# Patient Record
Sex: Female | Born: 1955 | Race: White | Hispanic: No | Marital: Married | Smoking: Former smoker
Health system: Southern US, Community
[De-identification: ages and names within clinical notes are randomized; demographics above are authoritative.]

## PROBLEM LIST (undated history)

## (undated) DIAGNOSIS — F419 Anxiety disorder, unspecified: Secondary | ICD-10-CM

## (undated) DIAGNOSIS — R918 Other nonspecific abnormal finding of lung field: Secondary | ICD-10-CM

## (undated) DIAGNOSIS — K589 Irritable bowel syndrome without diarrhea: Secondary | ICD-10-CM

## (undated) HISTORY — PX: TONSILLECTOMY: SUR1361

## (undated) HISTORY — PX: TUBAL LIGATION: SHX77

---

## 2002-05-08 ENCOUNTER — Emergency Department (HOSPITAL_COMMUNITY): Admission: EM | Admit: 2002-05-08 | Discharge: 2002-05-08 | Payer: Self-pay | Admitting: *Deleted

## 2002-05-08 ENCOUNTER — Encounter: Payer: Self-pay | Admitting: *Deleted

## 2004-11-28 ENCOUNTER — Ambulatory Visit: Payer: Self-pay | Admitting: Family Medicine

## 2005-02-13 ENCOUNTER — Ambulatory Visit: Payer: Self-pay | Admitting: Family Medicine

## 2005-04-27 ENCOUNTER — Ambulatory Visit: Payer: Self-pay | Admitting: Family Medicine

## 2005-08-30 ENCOUNTER — Ambulatory Visit: Payer: Self-pay | Admitting: Family Medicine

## 2005-12-11 ENCOUNTER — Ambulatory Visit: Payer: Self-pay | Admitting: Family Medicine

## 2006-09-11 ENCOUNTER — Ambulatory Visit: Payer: Self-pay | Admitting: Family Medicine

## 2007-03-12 ENCOUNTER — Ambulatory Visit: Payer: Self-pay | Admitting: Family Medicine

## 2014-06-16 DIAGNOSIS — F320 Major depressive disorder, single episode, mild: Secondary | ICD-10-CM | POA: Insufficient documentation

## 2015-03-16 DIAGNOSIS — Z01419 Encounter for gynecological examination (general) (routine) without abnormal findings: Secondary | ICD-10-CM | POA: Insufficient documentation

## 2015-03-16 DIAGNOSIS — F3342 Major depressive disorder, recurrent, in full remission: Secondary | ICD-10-CM | POA: Insufficient documentation

## 2015-03-16 DIAGNOSIS — Z23 Encounter for immunization: Secondary | ICD-10-CM | POA: Insufficient documentation

## 2015-07-12 DIAGNOSIS — M25462 Effusion, left knee: Secondary | ICD-10-CM | POA: Insufficient documentation

## 2015-07-12 DIAGNOSIS — M7122 Synovial cyst of popliteal space [Baker], left knee: Secondary | ICD-10-CM | POA: Insufficient documentation

## 2016-08-07 DIAGNOSIS — M549 Dorsalgia, unspecified: Secondary | ICD-10-CM | POA: Insufficient documentation

## 2018-07-30 DIAGNOSIS — F17210 Nicotine dependence, cigarettes, uncomplicated: Secondary | ICD-10-CM | POA: Insufficient documentation

## 2018-07-30 DIAGNOSIS — N951 Menopausal and female climacteric states: Secondary | ICD-10-CM | POA: Insufficient documentation

## 2019-01-03 ENCOUNTER — Other Ambulatory Visit: Payer: Self-pay

## 2019-01-03 ENCOUNTER — Emergency Department (HOSPITAL_COMMUNITY): Payer: BLUE CROSS/BLUE SHIELD

## 2019-01-03 ENCOUNTER — Encounter (HOSPITAL_COMMUNITY): Payer: Self-pay | Admitting: Emergency Medicine

## 2019-01-03 ENCOUNTER — Emergency Department (HOSPITAL_COMMUNITY)
Admission: EM | Admit: 2019-01-03 | Discharge: 2019-01-03 | Disposition: A | Discharge status: Home / Self Care | Payer: BLUE CROSS/BLUE SHIELD | Attending: Emergency Medicine | Admitting: Emergency Medicine

## 2019-01-03 DIAGNOSIS — K529 Noninfective gastroenteritis and colitis, unspecified: Secondary | ICD-10-CM

## 2019-01-03 DIAGNOSIS — E860 Dehydration: Secondary | ICD-10-CM

## 2019-01-03 DIAGNOSIS — E876 Hypokalemia: Secondary | ICD-10-CM | POA: Diagnosis not present

## 2019-01-03 DIAGNOSIS — R918 Other nonspecific abnormal finding of lung field: Secondary | ICD-10-CM | POA: Diagnosis not present

## 2019-01-03 DIAGNOSIS — F17210 Nicotine dependence, cigarettes, uncomplicated: Secondary | ICD-10-CM | POA: Diagnosis not present

## 2019-01-03 DIAGNOSIS — R197 Diarrhea, unspecified: Secondary | ICD-10-CM | POA: Diagnosis present

## 2019-01-03 LAB — COMPREHENSIVE METABOLIC PANEL
ALT: 22 U/L (ref 0–44)
AST: 26 U/L (ref 15–41)
Albumin: 4 g/dL (ref 3.5–5.0)
Alkaline Phosphatase: 99 U/L (ref 38–126)
Anion gap: 19 — ABNORMAL HIGH (ref 5–15)
BUN: 15 mg/dL (ref 8–23)
CO2: 25 mmol/L (ref 22–32)
Calcium: 9.5 mg/dL (ref 8.9–10.3)
Chloride: 86 mmol/L — ABNORMAL LOW (ref 98–111)
Creatinine, Ser: 0.89 mg/dL (ref 0.44–1.00)
GFR calc Af Amer: >60 mL/min (ref >60)
GFR calc non Af Amer: >60 mL/min (ref >60)
Glucose, Bld: 117 mg/dL — ABNORMAL HIGH (ref 70–99)
Potassium: 2.9 mmol/L — ABNORMAL LOW (ref 3.5–5.1)
SODIUM: 130 mmol/L — AB (ref 135–145)
Total Bilirubin: 0.7 mg/dL (ref 0.3–1.2)
Total Protein: 8.4 g/dL — ABNORMAL HIGH (ref 6.5–8.1)

## 2019-01-03 LAB — URINALYSIS, ROUTINE W REFLEX MICROSCOPIC
Bilirubin Urine: NEGATIVE
GLUCOSE, UA: NEGATIVE mg/dL
Ketones, ur: 5 mg/dL — AB
Leukocytes,Ua: NEGATIVE
Nitrite: NEGATIVE
Protein, ur: NEGATIVE mg/dL
Specific Gravity, Urine: 1.006 (ref 1.005–1.030)
pH: 7 (ref 5.0–8.0)

## 2019-01-03 LAB — CBC
HCT: 48.1 % — ABNORMAL HIGH (ref 36.0–46.0)
Hemoglobin: 16 g/dL — ABNORMAL HIGH (ref 12.0–15.0)
MCH: 30.2 pg (ref 26.0–34.0)
MCHC: 33.3 g/dL (ref 30.0–36.0)
MCV: 90.9 fL (ref 80.0–100.0)
NRBC: 0 % (ref 0.0–0.2)
PLATELETS: 544 10*3/uL — AB (ref 150–400)
RBC: 5.29 MIL/uL — ABNORMAL HIGH (ref 3.87–5.11)
RDW: 12.5 % (ref 11.5–15.5)
WBC: 13.7 10*3/uL — ABNORMAL HIGH (ref 4.0–10.5)

## 2019-01-03 LAB — LIPASE, BLOOD: Lipase: 45 U/L (ref 11–51)

## 2019-01-03 LAB — MAGNESIUM: Magnesium: 2.1 mg/dL (ref 1.7–2.4)

## 2019-01-03 MED ORDER — IOHEXOL 300 MG/ML  SOLN
100.0000 mL | Freq: Once | INTRAMUSCULAR | Status: AC | PRN
  Administered 2019-01-03: 100 mL via INTRAVENOUS

## 2019-01-03 MED ORDER — DICYCLOMINE HCL 20 MG PO TABS: 20.0000 mg | ORAL_TABLET | Freq: Two times a day (BID) | ORAL | 0 refills | Status: DC | PRN

## 2019-01-03 MED ORDER — ONDANSETRON HCL 4 MG PO TABS: 4.0000 mg | ORAL_TABLET | Freq: Four times a day (QID) | ORAL | 0 refills | Status: DC | PRN

## 2019-01-03 MED ORDER — SODIUM CHLORIDE 0.9% FLUSH
3.0000 mL | Freq: Once | INTRAVENOUS | Status: AC
  Administered 2019-01-03: 3 mL via INTRAVENOUS

## 2019-01-03 MED ORDER — POTASSIUM CHLORIDE 10 MEQ/100ML IV SOLN
10.0000 meq | INTRAVENOUS | Status: AC
  Administered 2019-01-03 (×2): 10 meq via INTRAVENOUS
  Filled 2019-01-03: qty 100

## 2019-01-03 MED ORDER — METRONIDAZOLE 500 MG PO TABS: 500.0000 mg | ORAL_TABLET | Freq: Two times a day (BID) | ORAL | 0 refills | Status: DC

## 2019-01-03 MED ORDER — POTASSIUM CHLORIDE CRYS ER 20 MEQ PO TBCR: 20.0000 meq | EXTENDED_RELEASE_TABLET | Freq: Every day | ORAL | 0 refills | Status: DC

## 2019-01-03 MED ORDER — CIPROFLOXACIN HCL 500 MG PO TABS: 500.0000 mg | ORAL_TABLET | Freq: Two times a day (BID) | ORAL | 0 refills | Status: DC

## 2019-01-03 MED ORDER — SODIUM CHLORIDE 0.9 % IV BOLUS
1000.0000 mL | Freq: Once | INTRAVENOUS | Status: AC
  Administered 2019-01-03: 1000 mL via INTRAVENOUS

## 2019-01-03 NOTE — ED Notes (Signed)
Patient transported to CT 

## 2019-01-03 NOTE — Discharge Instructions (Signed)
Call and make appointment to follow-up with your primary physician.  You will need further work-up for the mass in your lung.  Take antibiotics as prescribed.  Return for worsening pain, persistent vomiting, fever or for any concerns.

## 2019-01-03 NOTE — ED Provider Notes (Signed)
[facility] EMERGENCY DEPARTMENT Provider Note   CSN: [id number] Arrival date & time: 01/03/19  1357    History   Chief Complaint Chief Complaint  Patient presents with   Diarrhea    HPI Madeline Gates is a 63 y.o. female.     HPI Patient states that she developed vomiting and diarrhea 10 days ago.  The vomiting lasted roughly 3 days and resolved.  She is had continuous diarrhea since.  States she had multiple times daily.  She also endorses abdominal cramping.  She has lightheadedness and "sees stars when she stands up".  She is been taking over-the-counter Imodium with little relief.  The end of February patient had some teeth extracted and was on antibiotics at that time.  She is had no recent hospitalizations.  No known sick exposures.  No travel history.  No fever or chills.  No definite evidence of blood in the stool. History reviewed. No pertinent past medical history.  There are no active problems to display for this patient.   Past Surgical History:  Procedure Laterality Date   TONSILLECTOMY     TUBAL LIGATION       OB History   No obstetric history on file.      Home Medications    Prior to Admission medications   Medication Sig Start Date End Date Taking? Authorizing Provider  ciprofloxacin (CIPRO) 500 MG tablet Take 1 tablet (500 mg total) by mouth 2 (two) times daily. One po bid x 7 days 01/03/19   Julianne Rice, MD  dicyclomine (BENTYL) 20 MG tablet Take 1 tablet (20 mg total) by mouth 2 (two) times daily as needed for spasms. 01/03/19   Julianne Rice, MD  metroNIDAZOLE (FLAGYL) 500 MG tablet Take 1 tablet (500 mg total) by mouth 2 (two) times daily. One po bid x 7 days 01/03/19   Julianne Rice, MD  ondansetron (ZOFRAN) 4 MG tablet Take 1 tablet (4 mg total) by mouth every 6 (six) hours as needed. 01/03/19   Julianne Rice, MD  potassium chloride SA (K-DUR,KLOR-CON) 20 MEQ tablet Take 1 tablet (20 mEq total) by mouth daily. 01/03/19   Julianne Rice, MD    Family History No family history on file.  Social History Social History   Tobacco Use   Smoking status: Current Every Day Smoker    Packs/day: 1.00    Types: Cigarettes   Smokeless tobacco: Never Used  Substance Use Topics   Alcohol use: Not Currently   Drug use: Not Currently     Allergies   Demerol [meperidine hcl]   Review of Systems Review of Systems  Constitutional: Positive for fatigue. Negative for chills and fever.  HENT: Negative for sore throat and trouble swallowing.   Eyes: Negative for visual disturbance.  Respiratory: Negative for cough and shortness of breath.   Cardiovascular: Negative for chest pain.  Gastrointestinal: Positive for abdominal pain, diarrhea, nausea and vomiting. Negative for blood in stool and constipation.  Genitourinary: Negative for dysuria and flank pain.  Musculoskeletal: Negative for back pain, myalgias, neck pain and neck stiffness.  Skin: Negative for rash and wound.  Neurological: Positive for dizziness, weakness and light-headedness. Negative for numbness and headaches.  All other systems reviewed and are negative.    Physical Exam Updated Vital Signs BP 111/69    Pulse 89    Temp 98.4 F (36.9 C) (Oral)    Resp 17    Ht 5\' 5"  (1.651 m)    Wt 46.3 kg  SpO2 99%    BMI 16.97 kg/m   Physical Exam Vitals signs and nursing note reviewed.  Constitutional:      Appearance: She is well-developed.  HENT:     Head: Normocephalic and atraumatic.     Comments: Sunken eyes.  Dry mucous membranes    Mouth/Throat:     Mouth: Mucous membranes are dry.  Eyes:     Extraocular Movements: Extraocular movements intact.     Pupils: Pupils are equal, round, and reactive to light.  Neck:     Musculoskeletal: Normal range of motion and neck supple. No neck rigidity or muscular tenderness.  Cardiovascular:     Rate and Rhythm: Regular rhythm. Tachycardia present.     Heart sounds: No murmur. No friction rub. No gallop.    Pulmonary:     Effort: Pulmonary effort is normal. No respiratory distress.     Breath sounds: Normal breath sounds. No stridor. No wheezing, rhonchi or rales.  Chest:     Chest wall: No tenderness.  Abdominal:     General: Bowel sounds are normal.     Palpations: Abdomen is soft.     Tenderness: There is abdominal tenderness. There is no guarding or rebound.     Comments: Patient with diffuse upper abdominal tenderness to palpation.  No rebound or guarding.  Musculoskeletal: Normal range of motion.        General: No swelling, tenderness, deformity or signs of injury.     Right lower leg: No edema.     Left lower leg: No edema.  Lymphadenopathy:     Cervical: No cervical adenopathy.  Skin:    General: Skin is warm and dry.     Capillary Refill: Capillary refill takes less than 2 seconds.     Findings: No erythema or rash.  Neurological:     General: No focal deficit present.     Mental Status: She is alert and oriented to person, place, and time.  Psychiatric:        Mood and Affect: Mood normal.        Behavior: Behavior normal.      ED Treatments / Results  Labs (all labs ordered are listed, but only abnormal results are displayed) Labs Reviewed  COMPREHENSIVE METABOLIC PANEL - Abnormal; Notable for the following components:      Result Value   Sodium 130 (*)    Potassium 2.9 (*)    Chloride 86 (*)    Glucose, Bld 117 (*)    Total Protein 8.4 (*)    Anion gap 19 (*)    All other components within normal limits  CBC - Abnormal; Notable for the following components:   WBC 13.7 (*)    RBC 5.29 (*)    Hemoglobin 16.0 (*)    HCT 48.1 (*)    Platelets 544 (*)    All other components within normal limits  URINALYSIS, ROUTINE W REFLEX MICROSCOPIC - Abnormal; Notable for the following components:   Hgb urine dipstick LARGE (*)    Ketones, ur 5 (*)    Bacteria, UA RARE (*)    All other components within normal limits  C DIFFICILE QUICK SCREEN W PCR REFLEX    LIPASE, BLOOD  MAGNESIUM    EKG None  Radiology Ct Abdomen Pelvis W Contrast  Result Date: 01/03/2019 CLINICAL DATA:  Nausea and vomiting and recent weight loss EXAM: CT ABDOMEN AND PELVIS WITH CONTRAST TECHNIQUE: Multidetector CT imaging of the abdomen and pelvis was performed using  the standard protocol following bolus administration of intravenous contrast. CONTRAST:  180mL OMNIPAQUE IOHEXOL 300 MG/ML  SOLN COMPARISON:  None. FINDINGS: Lower chest: On the first image there is a mass lesion identified with spiculation in the right lower lobe which measures approximately 2.8 by 2.1 cm in greatest dimension. This is incompletely evaluated on this exam and is highly suspicious for primary pulmonary neoplasm. The remainder of the lung bases are clear. Hepatobiliary: Gallbladder is well distended. The liver is within normal limits without focal mass. Dilatation of the common bile duct is noted to the level of the pancreatic head. This measures 9 mm greater than that expected for a patient of this age. Minimal intrahepatic ductal fullness is noted as well. Increased density is noted in the distal aspect of the common bile duct in the head of the pancreas which may represent a focal stone. Pancreas: Unremarkable. No pancreatic ductal dilatation or surrounding inflammatory changes. Spleen: Normal in size without focal abnormality. Adrenals/Urinary Tract: Adrenal glands are within normal limits. Kidneys are well visualized bilaterally demonstrate a normal enhancement pattern. Normal excretion is noted bilaterally. No renal or ureteral calculi are seen. The bladder is well distended. Stomach/Bowel: Colon is predominately decompressed. Some mild wall thickening and mild pericolonic inflammatory change is noted particularly in the ascending colon and sigmoid colon. This may represent a mild colitis. No obstructive changes are seen. Small bowel and stomach are within normal limits. Vascular/Lymphatic: Aortic  atherosclerosis. No enlarged abdominal or pelvic lymph nodes. Reproductive: Uterus and bilateral adnexa are unremarkable. Other: No abdominal wall hernia or abnormality. No abdominopelvic ascites. Musculoskeletal: No acute or significant osseous findings. IMPRESSION: Spiculated mass lesion in the right lower lobe highly suspicious for primary pulmonary neoplasm. Further evaluation by means of CT of the chest with contrast is recommended Mild inflammatory changes surrounding the colon particularly in the ascending and sigmoid regions suggestive of colitis. No vascular abnormality is seen in this is likely infectious in etiology. Distended gallbladder and prominent bile duct with some suggestion of filling defect at the level of the ampulla. Electronically Signed   By: Inez Catalina M.D.   On: 01/03/2019 18:13    Procedures Procedures (including critical care time)  Medications Ordered in ED Medications  sodium chloride flush (NS) 0.9 % injection 3 mL (3 mLs Intravenous Given 01/03/19 1656)  sodium chloride 0.9 % bolus 1,000 mL (0 mLs Intravenous Stopped 01/03/19 1820)  potassium chloride 10 mEq in 100 mL IVPB (0 mEq Intravenous Stopped 01/03/19 1821)  iohexol (OMNIPAQUE) 300 MG/ML solution 100 mL (100 mLs Intravenous Contrast Given 01/03/19 1732)     Initial Impression / Assessment and Plan / ED Course  I have reviewed the triage vital signs and the nursing notes.  Pertinent labs & imaging results that were available during my care of the patient were reviewed by me and considered in my medical decision making (see chart for details).        Reviewed patient CT scan results.  Advised admission for IV antibiotics and IV fluid.  Patient is declining this stating she wants to go home.  She understands she has a new mass in her lung which is concerning for ligament see and needs further work-up.  States she will follow-up with her primary physician.  She is tolerating oral intake.  Strict return  precautions have been given.  Final Clinical Impressions(s) / ED Diagnoses   Final diagnoses:  Colitis  Mass of right lung  Hypokalemia  Dehydration    ED  Discharge Orders         Ordered    dicyclomine (BENTYL) 20 MG tablet  2 times daily PRN     01/03/19 1911    potassium chloride SA (K-DUR,KLOR-CON) 20 MEQ tablet  Daily     01/03/19 1911    ciprofloxacin (CIPRO) 500 MG tablet  2 times daily     01/03/19 1911    metroNIDAZOLE (FLAGYL) 500 MG tablet  2 times daily     01/03/19 1911    ondansetron (ZOFRAN) 4 MG tablet  Every 6 hours PRN     01/03/19 1911           Julianne Rice, MD 01/03/19 1912

## 2019-01-03 NOTE — ED Triage Notes (Signed)
Pt reports ongoing diarrhea x 2 weeks. Pt states prior to this, she had a stomach virus, recovered from that and shortly after began having diarrhea and nausea. States that she has lost over 10 lbs.

## 2019-01-06 ENCOUNTER — Other Ambulatory Visit: Payer: Self-pay | Admitting: Family Medicine

## 2019-01-06 ENCOUNTER — Other Ambulatory Visit (HOSPITAL_COMMUNITY): Payer: Self-pay | Admitting: Family Medicine

## 2019-01-06 DIAGNOSIS — R918 Other nonspecific abnormal finding of lung field: Secondary | ICD-10-CM

## 2019-01-24 ENCOUNTER — Other Ambulatory Visit: Payer: Self-pay

## 2019-01-24 ENCOUNTER — Ambulatory Visit (HOSPITAL_COMMUNITY)
Admission: RE | Admit: 2019-01-24 | Discharge: 2019-01-24 | Disposition: A | Discharge status: Home / Self Care | Payer: BLUE CROSS/BLUE SHIELD | Source: Ambulatory Visit | Attending: Family Medicine | Admitting: Family Medicine

## 2019-01-24 DIAGNOSIS — R918 Other nonspecific abnormal finding of lung field: Secondary | ICD-10-CM | POA: Insufficient documentation

## 2019-01-24 MED ORDER — IOHEXOL 300 MG/ML  SOLN
75.0000 mL | Freq: Once | INTRAMUSCULAR | Status: AC | PRN
  Administered 2019-01-24: 14:00:00 75 mL via INTRAVENOUS

## 2019-01-27 DIAGNOSIS — F411 Generalized anxiety disorder: Secondary | ICD-10-CM | POA: Insufficient documentation

## 2019-01-30 ENCOUNTER — Inpatient Hospital Stay (HOSPITAL_COMMUNITY): Payer: BLUE CROSS/BLUE SHIELD | Attending: Hematology | Admitting: Hematology

## 2019-01-30 ENCOUNTER — Other Ambulatory Visit: Payer: Self-pay

## 2019-01-30 ENCOUNTER — Encounter (HOSPITAL_COMMUNITY): Payer: Self-pay | Admitting: Hematology

## 2019-01-30 DIAGNOSIS — R918 Other nonspecific abnormal finding of lung field: Secondary | ICD-10-CM | POA: Diagnosis present

## 2019-01-30 DIAGNOSIS — Z801 Family history of malignant neoplasm of trachea, bronchus and lung: Secondary | ICD-10-CM

## 2019-01-30 DIAGNOSIS — Z79899 Other long term (current) drug therapy: Secondary | ICD-10-CM

## 2019-01-30 DIAGNOSIS — R634 Abnormal weight loss: Secondary | ICD-10-CM

## 2019-01-30 DIAGNOSIS — F17210 Nicotine dependence, cigarettes, uncomplicated: Secondary | ICD-10-CM

## 2019-01-30 DIAGNOSIS — Z8049 Family history of malignant neoplasm of other genital organs: Secondary | ICD-10-CM

## 2019-01-30 NOTE — Progress Notes (Signed)
[facility] NOTE  Patient Care Team: Dione Housekeeper, MD as PCP - General (Family Medicine)  CHIEF COMPLAINTS/PURPOSE OF CONSULTATION:  Right lung masses  HISTORY OF PRESENTING ILLNESS:  Madeline Gates 63 y.o. female is seen in consultation today for further work-up of right lung masses. She is here today alone. She states that she wears hearing aids and is hard of hearing. She states that she has had a weight loss of 10 pounds in the last 6 months. 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 She states that she has smoked since she was 46, she has currentlly stopped smoking with chantix. She states that she has a paternal aunt with lung cancer, her mother had MDS, and her maternal grandmother had ovarian cancer.  She denies any cough or hemoptysis.  Denies any headaches or vision changes.  Otherwise she is very active and denies any shortness of breath on exertion.  MEDICAL HISTORY:  History reviewed. No pertinent past medical history.  SURGICAL HISTORY: Past Surgical History:  Procedure Laterality Date  . TONSILLECTOMY    . TUBAL LIGATION      SOCIAL HISTORY: Social History   Socioeconomic History  . Marital status: Married    Spouse name: Percell Miller  . Number of children: 1  . Years of education: Not on file  . Highest education level: Not on file  Occupational History  . Occupation: Forensic psychologist  Social Needs  . Financial resource strain: Not hard at all  . Food insecurity:    Worry: Never true    Inability: Never true  . Transportation needs:    Medical: Yes    Non-medical: Yes  Tobacco Use  . Smoking status: Current Every Day Smoker    Packs/day: 1.00    Types: Cigarettes  . Smokeless tobacco: Never Used  Substance and Sexual Activity  . Alcohol use: Not Currently  . Drug use: Not Currently  . Sexual activity: Not on file  Lifestyle  . Physical activity:    Days per week: 2 days     Minutes per session: 20 min  . Stress: Rather much  Relationships  . Social connections:    Talks on phone: More than three times a week    Gets together: Twice a week    Attends religious service: Never    Active member of club or organization: No    Attends meetings of clubs or organizations: Never    Relationship status: Married  . Intimate partner violence:    Fear of current or ex partner: No    Emotionally abused: No    Physically abused: No    Forced sexual activity: No  Other Topics Concern  . Not on file  Social History Narrative  . Not on file    FAMILY HISTORY: Family History  Problem Relation Age of Onset  . Myelodysplastic syndrome Mother   . Clotting disorder Father   . Lung cancer Paternal Aunt     ALLERGIES:  is allergic to demerol [meperidine hcl].  MEDICATIONS:  Current Outpatient Medications  Medication Sig Dispense Refill  . sertraline (ZOLOFT) 100 MG tablet Take 100 mg by mouth daily.     . varenicline (CHANTIX STARTING MONTH PAK) 0.5 MG X 11 & 1 MG X 42 tablet Take 1 mg by mouth 2 (two) times daily.      No current facility-administered medications for this visit.  REVIEW OF SYSTEMS:   Constitutional: Denies fevers, chills or abnormal night sweats Eyes: Denies blurriness of vision, double vision or watery eyes Ears, nose, mouth, throat, and face: Denies mucositis or sore throat Respiratory: Denies cough, dyspnea or wheezes Cardiovascular: Denies palpitation, chest discomfort or lower extremity swelling Gastrointestinal:  Denies nausea, heartburn or change in bowel habits Skin: Denies abnormal skin rashes Lymphatics: Denies new lymphadenopathy or easy bruising Neurological:Denies numbness, tingling or new weaknesses Behavioral/Psych: Mood is stable, no new changes  All other systems were reviewed with the patient and are negative.  PHYSICAL EXAMINATION: ECOG PERFORMANCE STATUS: 0 - Asymptomatic  Vitals:   01/30/19 1252  BP: (!)  100/36  Pulse: 61  Resp: 16  Temp: 98.3 F (36.8 C)  SpO2: 100%   Filed Weights   01/30/19 1252  Weight: 108 lb (49 kg)    GENERAL:alert, no distress and comfortable SKIN: skin color, texture, turgor are normal, no rashes or significant lesions EYES: normal, conjunctiva are pink and non-injected, sclera clear OROPHARYNX:no exudate, no erythema and lips, buccal mucosa, and tongue normal  NECK: supple, thyroid normal size, non-tender, without nodularity LYMPH:  no palpable lymphadenopathy in the cervical, axillary or inguinal LUNGS: clear to auscultation and percussion with normal breathing effort HEART: regular rate & rhythm and no murmurs and no lower extremity edema ABDOMEN:abdomen soft, non-tender and normal bowel sounds Musculoskeletal:no cyanosis of digits and no clubbing  PSYCH: alert & oriented x 3 with fluent speech NEURO: no focal motor/sensory deficits  LABORATORY DATA:  I have reviewed the data as listed Lab Results  Component Value Date   WBC 13.7 (H) 01/03/2019   HGB 16.0 (H) 01/03/2019   HCT 48.1 (H) 01/03/2019   MCV 90.9 01/03/2019   PLT 544 (H) 01/03/2019     Chemistry      Component Value Date/Time   NA 130 (L) 01/03/2019 1555   K 2.9 (L) 01/03/2019 1555   CL 86 (L) 01/03/2019 1555   CO2 25 01/03/2019 1555   BUN 15 01/03/2019 1555   CREATININE 0.89 01/03/2019 1555      Component Value Date/Time   CALCIUM 9.5 01/03/2019 1555   ALKPHOS 99 01/03/2019 1555   AST 26 01/03/2019 1555   ALT 22 01/03/2019 1555   BILITOT 0.7 01/03/2019 1555       RADIOGRAPHIC STUDIES: I have personally reviewed the radiological images as listed and agreed with the findings in the report. Ct Chest W Contrast  Result Date: 01/24/2019 CLINICAL DATA:  Right lung mass. EXAM: CT CHEST WITH CONTRAST TECHNIQUE: Multidetector CT imaging of the chest was performed during intravenous contrast administration. CONTRAST:  12mL OMNIPAQUE IOHEXOL 300 MG/ML  SOLN COMPARISON:  CT scan  of January 03, 2019. FINDINGS: Cardiovascular: Atherosclerosis of thoracic aorta is noted without aneurysm or dissection. Normal cardiac size. No pericardial effusion. Mediastinum/Nodes: No enlarged mediastinal, hilar, or axillary lymph nodes. Thyroid gland, trachea, and esophagus demonstrate no significant findings. Lungs/Pleura: 3.6 x 3.3 cm spiculated mass is noted in the right lung apex highly concerning for malignancy. Also noted is 3.1 x 2.3 cm cavitary and spiculated mass in the right lower lobe concerning for malignancy. No pneumothorax or pleural effusion is noted. Left lung is unremarkable. Upper Abdomen: No acute abnormality. Musculoskeletal: No chest wall abnormality. No acute or significant osseous findings. IMPRESSION: 3.6 cm spiculated mass seen in right lung base, as well as 3.1 cm cavitary spiculated mass seen in the right lower lobe. Both of these are concerning for malignancy or  possibly metastatic disease. PET scan is recommended for further evaluation. These results will be called to the ordering clinician or representative by the Radiologist Assistant, and communication documented in the PACS or zVision Dashboard. Aortic Atherosclerosis (ICD10-I70.0). Electronically Signed   By: Marijo Conception, M.D.   On: 01/24/2019 14:20   Ct Abdomen Pelvis W Contrast  Result Date: 01/03/2019 CLINICAL DATA:  Nausea and vomiting and recent weight loss EXAM: CT ABDOMEN AND PELVIS WITH CONTRAST TECHNIQUE: Multidetector CT imaging of the abdomen and pelvis was performed using the standard protocol following bolus administration of intravenous contrast. CONTRAST:  173mL OMNIPAQUE IOHEXOL 300 MG/ML  SOLN COMPARISON:  None. FINDINGS: Lower chest: On the first image there is a mass lesion identified with spiculation in the right lower lobe which measures approximately 2.8 by 2.1 cm in greatest dimension. This is incompletely evaluated on this exam and is highly suspicious for primary pulmonary neoplasm. The  remainder of the lung bases are clear. Hepatobiliary: Gallbladder is well distended. The liver is within normal limits without focal mass. Dilatation of the common bile duct is noted to the level of the pancreatic head. This measures 9 mm greater than that expected for a patient of this age. Minimal intrahepatic ductal fullness is noted as well. Increased density is noted in the distal aspect of the common bile duct in the head of the pancreas which may represent a focal stone. Pancreas: Unremarkable. No pancreatic ductal dilatation or surrounding inflammatory changes. Spleen: Normal in size without focal abnormality. Adrenals/Urinary Tract: Adrenal glands are within normal limits. Kidneys are well visualized bilaterally demonstrate a normal enhancement pattern. Normal excretion is noted bilaterally. No renal or ureteral calculi are seen. The bladder is well distended. Stomach/Bowel: Colon is predominately decompressed. Some mild wall thickening and mild pericolonic inflammatory change is noted particularly in the ascending colon and sigmoid colon. This may represent a mild colitis. No obstructive changes are seen. Small bowel and stomach are within normal limits. Vascular/Lymphatic: Aortic atherosclerosis. No enlarged abdominal or pelvic lymph nodes. Reproductive: Uterus and bilateral adnexa are unremarkable. Other: No abdominal wall hernia or abnormality. No abdominopelvic ascites. Musculoskeletal: No acute or significant osseous findings. IMPRESSION: Spiculated mass lesion in the right lower lobe highly suspicious for primary pulmonary neoplasm. Further evaluation by means of CT of the chest with contrast is recommended Mild inflammatory changes surrounding the colon particularly in the ascending and sigmoid regions suggestive of colitis. No vascular abnormality is seen in this is likely infectious in etiology. Distended gallbladder and prominent bile duct with some suggestion of filling defect at the level of  the ampulla. Electronically Signed   By: Inez Catalina M.D.   On: 01/03/2019 18:13    ASSESSMENT & PLAN:  Mass of upper lobe of right lung 1.  Right lung masses: - Patient had history of diarrhea and went to the ER on 01/03/2019.  A CT of the abdomen and pelvis showed an incidental right lower lobe lung nodule. - Patient smoked 1 pack/day for the last 43 years.  She is quit smoking and has been on Chantix for the past few weeks. - She reports about 10 pound weight loss since the episode of gastroenteritis. - CT of the chest with contrast on 01/24/2019 shows 3.6 x 3.3 cm spiculated mass in the right lung apex highly concerning for malignancy.  Also noted a 3.1 x 2.3 cm cavitary spiculated mass in the right lower lobe.  No enlarged lymphadenopathy was seen. - I discussed the results of  the scans with the patient in detail.  I have recommended doing a PET CT scan as well as a brain MRI for further staging. -I will see her back after the PET scan.  2.  Family history: -Paternal aunt had lung cancer. -Mother had MDS. -Maternal grandmother had uterine cancer.  Orders Placed This Encounter  Procedures  . NM PET Image Initial (PI) Skull Base To Thigh    Standing Status:   Future    Standing Expiration Date:   01/30/2020    Order Specific Question:   ** REASON FOR EXAM (FREE TEXT)    Answer:   lung mass    Order Specific Question:   If indicated for the ordered procedure, I authorize the administration of a radiopharmaceutical per Radiology protocol    Answer:   Yes    Order Specific Question:   Preferred imaging location?    Answer:   [facility]    Order Specific Question:   Radiology Contrast Protocol - do NOT remove file path    Answer:   [email or web address]  . MR Brain W Wo Contrast    Standing Status:   Future    Standing Expiration Date:   01/30/2020    Order Specific Question:   ** REASON FOR EXAM (FREE TEXT)    Answer:   lung mass    Order Specific  Question:   If indicated for the ordered procedure, I authorize the administration of contrast media per Radiology protocol    Answer:   Yes    Order Specific Question:   What is the patient's sedation requirement?    Answer:   No Sedation    Order Specific Question:   Does the patient have a pacemaker or implanted devices?    Answer:   No    Order Specific Question:   Use SRS Protocol?    Answer:   Yes    Order Specific Question:   Radiology Contrast Protocol - do NOT remove file path    Answer:   [email or web address]    Order Specific Question:   Preferred imaging location?    Answer:   [facility] (table limit-350lbs)    All questions were answered. The patient knows to call the clinic with any problems, questions or concerns.     Derek Jack, MD 01/30/2019 2:44 PM

## 2019-01-30 NOTE — Patient Instructions (Signed)
[place] at [facility] Discharge Instructions  You were seen today by Dr. Delton Coombes. He would like you to have a PET scan and a MRI of your brain. He will see you back after your scans for follow up.   Thank you for choosing [place] at [facility] to provide your oncology and hematology care.  To afford each patient quality time with our provider, please arrive at least 15 minutes before your scheduled appointment time.   If you have a lab appointment with the [place] please come in thru the  Main Entrance and check in at the main information desk  You need to re-schedule your appointment should you arrive 10 or more minutes late.  We strive to give you quality time with our providers, and arriving late affects you and other patients whose appointments are after yours.  Also, if you no show three or more times for appointments you may be dismissed from the clinic at the providers discretion.     Again, thank you for choosing [facility].  Our hope is that these requests will decrease the amount of time that you wait before being seen by our physicians.       _____________________________________________________________  Should you have questions after your visit to [facility], please contact our office at [phone] between the hours of 8:00 a.m. and 4:30 p.m.  Voicemails left after 4:00 p.m. will not be returned until the following business day.  For prescription refill requests, have your pharmacy contact our office and allow 72 hours.    Cancer Center Support Programs:   > Cancer Support Group  2nd Tuesday of the month 1pm-2pm, Journey Room  \

## 2019-01-30 NOTE — Assessment & Plan Note (Signed)
1.  Right lung masses: - Patient had history of diarrhea and went to the ER on 01/03/2019.  A CT of the abdomen and pelvis showed an incidental right lower lobe lung nodule. - Patient smoked 1 pack/day for the last 43 years.  She is quit smoking and has been on Chantix for the past few weeks. - She reports about 10 pound weight loss since the episode of gastroenteritis. - CT of the chest with contrast on 01/24/2019 shows 3.6 x 3.3 cm spiculated mass in the right lung apex highly concerning for malignancy.  Also noted a 3.1 x 2.3 cm cavitary spiculated mass in the right lower lobe.  No enlarged lymphadenopathy was seen. - I discussed the results of the scans with the patient in detail.  I have recommended doing a PET CT scan as well as a brain MRI for further staging. -I will see her back after the PET scan.  2.  Family history: -Paternal aunt had lung cancer. -Mother had MDS. -Maternal grandmother had uterine cancer.

## 2019-02-03 DIAGNOSIS — R197 Diarrhea, unspecified: Secondary | ICD-10-CM | POA: Insufficient documentation

## 2019-02-04 ENCOUNTER — Other Ambulatory Visit: Payer: Self-pay

## 2019-02-04 ENCOUNTER — Encounter: Payer: Self-pay | Admitting: General Practice

## 2019-02-04 ENCOUNTER — Ambulatory Visit (HOSPITAL_COMMUNITY)
Admission: RE | Admit: 2019-02-04 | Discharge: 2019-02-04 | Disposition: A | Discharge status: Home / Self Care | Payer: BLUE CROSS/BLUE SHIELD | Source: Ambulatory Visit | Attending: Hematology | Admitting: Hematology

## 2019-02-04 DIAGNOSIS — R918 Other nonspecific abnormal finding of lung field: Secondary | ICD-10-CM | POA: Insufficient documentation

## 2019-02-04 MED ORDER — GADOBUTROL 1 MMOL/ML IV SOLN
4.0000 mL | Freq: Once | INTRAVENOUS | Status: AC | PRN
  Administered 2019-02-04: 4 mL via INTRAVENOUS

## 2019-02-04 NOTE — Progress Notes (Signed)
[place] Psychosocial Distress Screening Clinical Social Work  Clinical Social Work was referred by distress screening protocol.  The patient scored a 9 on the Psychosocial Distress Thermometer which indicates severe distress. Clinical Social Worker contacted patient by phone to assess for distress and other psychosocial needs. Left generic VM w my contact information and encouragement to call for support/resources.  ONCBCN DISTRESS SCREENING 01/30/2019  Screening Type Initial Screening  Distress experienced in past week (1-10) 9  Information Concerns Type Lack of info about diagnosis  Physician notified of physical symptoms Yes  Referral to clinical psychology No  Referral to clinical social work No  Referral to dietition No  Referral to financial advocate No  Referral to support programs No  Referral to palliative care No      Clinical Social Worker follow up needed: No.  Await return call from patient.   If yes, follow up plan:  Beverely Pace, LCSW

## 2019-02-07 ENCOUNTER — Other Ambulatory Visit: Payer: Self-pay

## 2019-02-07 ENCOUNTER — Ambulatory Visit (HOSPITAL_COMMUNITY)
Admission: RE | Admit: 2019-02-07 | Discharge: 2019-02-07 | Disposition: A | Discharge status: Home / Self Care | Payer: BLUE CROSS/BLUE SHIELD | Source: Ambulatory Visit | Attending: Hematology | Admitting: Hematology

## 2019-02-07 DIAGNOSIS — R918 Other nonspecific abnormal finding of lung field: Secondary | ICD-10-CM | POA: Diagnosis present

## 2019-02-07 LAB — GLUCOSE, CAPILLARY: Glucose-Capillary: 100 mg/dL — ABNORMAL HIGH (ref 70–99)

## 2019-02-07 MED ORDER — FLUDEOXYGLUCOSE F - 18 (FDG) INJECTION
5.4100 | Freq: Once | INTRAVENOUS | Status: AC | PRN
  Administered 2019-02-07: 5.41 via INTRAVENOUS

## 2019-02-10 ENCOUNTER — Inpatient Hospital Stay (HOSPITAL_COMMUNITY): Payer: BLUE CROSS/BLUE SHIELD | Admitting: Hematology

## 2019-02-10 ENCOUNTER — Encounter (HOSPITAL_COMMUNITY): Payer: Self-pay | Admitting: Hematology

## 2019-02-10 ENCOUNTER — Other Ambulatory Visit: Payer: Self-pay

## 2019-02-10 VITALS — BP 101/89 | HR 72 | Temp 98.6°F | Resp 18 | Wt 108.4 lb

## 2019-02-10 DIAGNOSIS — Z801 Family history of malignant neoplasm of trachea, bronchus and lung: Secondary | ICD-10-CM

## 2019-02-10 DIAGNOSIS — F17210 Nicotine dependence, cigarettes, uncomplicated: Secondary | ICD-10-CM

## 2019-02-10 DIAGNOSIS — R918 Other nonspecific abnormal finding of lung field: Secondary | ICD-10-CM

## 2019-02-10 DIAGNOSIS — R634 Abnormal weight loss: Secondary | ICD-10-CM | POA: Diagnosis not present

## 2019-02-10 DIAGNOSIS — Z79899 Other long term (current) drug therapy: Secondary | ICD-10-CM | POA: Diagnosis not present

## 2019-02-10 DIAGNOSIS — Z8049 Family history of malignant neoplasm of other genital organs: Secondary | ICD-10-CM

## 2019-02-10 NOTE — Patient Instructions (Signed)
[place] at [place] Discharge Instructions  You were seen today by Dr. Delton Coombes. He went over your recent scan results. He will see you back after you see Dr Roxan Hockey labs and follow up.   Thank you for choosing [place] at [facility] to provide your oncology and hematology care.  To afford each patient quality time with our provider, please arrive at least 15 minutes before your scheduled appointment time.   If you have a lab appointment with the [place] please come in thru the  Main Entrance and check in at the main information desk  You need to re-schedule your appointment should you arrive 10 or more minutes late.  We strive to give you quality time with our providers, and arriving late affects you and other patients whose appointments are after yours.  Also, if you no show three or more times for appointments you may be dismissed from the clinic at the providers discretion.     Again, thank you for choosing [facility].  Our hope is that these requests will decrease the amount of time that you wait before being seen by our physicians.       _____________________________________________________________  Should you have questions after your visit to [facility], please contact our office at [phone] between the hours of 8:00 a.m. and 4:30 p.m.  Voicemails left after 4:00 p.m. will not be returned until the following business day.  For prescription refill requests, have your pharmacy contact our office and allow 72 hours.    Cancer Center Support Programs:   > Cancer Support Group  2nd Tuesday of the month 1pm-2pm, Journey Room

## 2019-02-10 NOTE — Assessment & Plan Note (Signed)
1.  Right lung masses: - Patient had history of diarrhea and went to the ER on 01/03/2019.  A CT of the abdomen and pelvis showed an incidental right lower lobe lung nodule. - Patient smoked 1 pack/day for the last 43 years.  She is quit smoking and has been on Chantix for the past few weeks. - She reports about 10 pound weight loss since the episode of gastroenteritis. - CT of the chest with contrast on 01/24/2019 shows 3.6 x 3.3 cm spiculated mass in the right lung apex highly concerning for malignancy.  Also noted a 3.1 x 2.3 cm cavitary spiculated mass in the right lower lobe.  No enlarged lymphadenopathy was seen. - MRI of the brain on 02/04/2019 shows no evidence of metastasis.  Cerebral white matter T2 hyperintensities with a configuration most suspicious for multiple sclerosis.  However patient does not have any clinical signs or symptoms of MS at this time. - We reviewed results of the PET scan dated 02/07/2019 which shows hypermetabolic right upper and right lower lobe lung masses consistent with synchronous primary bronchogenic carcinoma.  No evidence of hypermetabolic metastatic disease. -I will order PFTs with bronchodilators. -I will make a referral to Dr. Roxan Hockey to get his opinion. -I will see her back in 4 weeks for follow-up.  2.  Family history: -Paternal aunt had lung cancer. -Mother had MDS. -Maternal grandmother had uterine cancer.

## 2019-02-10 NOTE — Progress Notes (Addendum)
Madeline Gates, Madeline Gates [id number]   CLINIC:  Medical Oncology/Hematology  PCP:  Dione Housekeeper, [place] [phone]   REASON FOR VISIT:  Follow-up for Right lung masses    INTERVAL HISTORY:  [name] 63 y.o. female returns for routine follow-up. She is here today alone. She states that . Denies any nausea, vomiting, or diarrhea. Denies any new pains. Had not noticed any recent bleeding such as epistaxis, hematuria or hematochezia. Denies recent chest pain on exertion, shortness of breath on minimal exertion, pre-syncopal episodes, or palpitations. Denies any numbness or tingling in hands or feet. Denies any recent fevers, infections, or recent hospitalizations. Patient reports appetite at 50% and energy level at 50%.     REVIEW OF SYSTEMS:  Review of Systems  Constitutional: Positive for fatigue.  All other systems reviewed and are negative.    PAST MEDICAL/SURGICAL HISTORY:  History reviewed. No pertinent past medical history. Past Surgical History:  Procedure Laterality Date  . TONSILLECTOMY    . TUBAL LIGATION       SOCIAL HISTORY:  Social History   Socioeconomic History  . Marital status: Married    Spouse name: Percell Miller  . Number of children: 1  . Years of education: Not on file  . Highest education level: Not on file  Occupational History  . Occupation: Forensic psychologist  Social Needs  . Financial resource strain: Not hard at all  . Food insecurity:    Worry: Never true    Inability: Never true  . Transportation needs:    Medical: Yes    Non-medical: Yes  Tobacco Use  . Smoking status: Current Every Day Smoker    Packs/day: 1.00    Types: Cigarettes  . Smokeless tobacco: Never Used  Substance and Sexual Activity  . Alcohol use: Not Currently  . Drug use: Not Currently  . Sexual activity: Not on file  Lifestyle  . Physical activity:    Days per week: 2 days    Minutes per  session: 20 min  . Stress: Rather much  Relationships  . Social connections:    Talks on phone: More than three times a week    Gets together: Twice a week    Attends religious service: Never    Active member of club or organization: No    Attends meetings of clubs or organizations: Never    Relationship status: Married  . Intimate partner violence:    Fear of current or ex partner: No    Emotionally abused: No    Physically abused: No    Forced sexual activity: No  Other Topics Concern  . Not on file  Social History Narrative  . Not on file    FAMILY HISTORY:  Family History  Problem Relation Age of Onset  . Myelodysplastic syndrome Mother   . Clotting disorder Father   . Lung cancer Paternal Aunt     CURRENT MEDICATIONS:  Outpatient Encounter Medications as of 02/10/2019  Medication Sig  . dicyclomine (BENTYL) 20 MG tablet Take 20 mg by mouth every 6 (six) hours.  . ondansetron (ZOFRAN) 8 MG tablet Take 8 mg by mouth every 8 (eight) hours as needed for nausea or vomiting.  . sertraline (ZOLOFT) 100 MG tablet Take 100 mg by mouth daily.   . [DISCONTINUED] varenicline (CHANTIX STARTING MONTH PAK) 0.5 MG X 11 & 1 MG X 42 tablet Take 1 mg by mouth 2 (two) times daily.  No facility-administered encounter medications on file as of 02/10/2019.     ALLERGIES:  Allergies  Allergen Reactions  . Demerol [Meperidine Hcl] Swelling    Swelling around IV site     PHYSICAL EXAM:  ECOG Performance status: 1  Vitals:   02/10/19 1104  BP: 101/89  Pulse: 72  Resp: 18  Temp: 98.6 F (37 C)  SpO2: 99%   Filed Weights   02/10/19 1104  Weight: 108 lb 6.4 oz (49.2 kg)    Physical Exam Vitals signs reviewed.  Constitutional:      Appearance: Normal appearance.  Cardiovascular:     Rate and Rhythm: Normal rate and regular rhythm.     Heart sounds: Normal heart sounds.  Pulmonary:     Effort: Pulmonary effort is normal.     Breath sounds: Normal breath sounds.   Abdominal:     General: There is no distension.     Palpations: Abdomen is soft.  Musculoskeletal:        General: No swelling.  Skin:    General: Skin is warm.  Neurological:     General: No focal deficit present.     Mental Status: She is alert and oriented to person, place, and time.  Psychiatric:        Mood and Affect: Mood normal.        Behavior: Behavior normal.      LABORATORY DATA:  I have reviewed the labs as listed.  CBC    Component Value Date/Time   WBC 13.7 (H) 01/03/2019 1555   RBC 5.29 (H) 01/03/2019 1555   HGB 16.0 (H) 01/03/2019 1555   HCT 48.1 (H) 01/03/2019 1555   PLT 544 (H) 01/03/2019 1555   MCV 90.9 01/03/2019 1555   MCH 30.2 01/03/2019 1555   MCHC 33.3 01/03/2019 1555   RDW 12.5 01/03/2019 1555   CMP Latest Ref Rng & Units 01/03/2019  Glucose 70 - 99 mg/dL 117(H)  BUN 8 - 23 mg/dL 15  Creatinine 0.44 - 1.00 mg/dL 0.89  Sodium 135 - 145 mmol/L 130(L)  Potassium 3.5 - 5.1 mmol/L 2.9(L)  Chloride 98 - 111 mmol/L 86(L)  CO2 22 - 32 mmol/L 25  Calcium 8.9 - 10.3 mg/dL 9.5  Total Protein 6.5 - 8.1 g/dL 8.4(H)  Total Bilirubin 0.3 - 1.2 mg/dL 0.7  Alkaline Phos 38 - 126 U/L 99  AST 15 - 41 U/L 26  ALT 0 - 44 U/L 22       DIAGNOSTIC IMAGING:  I have independently reviewed the scans and discussed with the patient.   I have reviewed Venita Lick LPN's note and agree with the documentation.  I personally performed a face-to-face visit, made revisions and my assessment and plan is as follows.    ASSESSMENT & PLAN:   Right lower lobe lung mass 1.  Right lung masses: - Patient had history of diarrhea and went to the ER on 01/03/2019.  A CT of the abdomen and pelvis showed an incidental right lower lobe lung nodule. - Patient smoked 1 pack/day for the last 43 years.  She is quit smoking and has been on Chantix for the past few weeks. - She reports about 10 pound weight loss since the episode of gastroenteritis. - CT of the chest with  contrast on 01/24/2019 shows 3.6 x 3.3 cm spiculated mass in the right lung apex highly concerning for malignancy.  Also noted a 3.1 x 2.3 cm cavitary spiculated mass in the right lower lobe.  No enlarged lymphadenopathy was seen. - MRI of the brain on 02/04/2019 shows no evidence of metastasis.  Cerebral white matter T2 hyperintensities with a configuration most suspicious for multiple sclerosis.  However patient does not have any clinical signs or symptoms of MS at this time. - We reviewed results of the PET scan dated 02/07/2019 which shows hypermetabolic right upper and right lower lobe lung masses consistent with synchronous primary bronchogenic carcinoma.  No evidence of hypermetabolic metastatic disease. -I will order PFTs with bronchodilators. -I will make a referral to Dr. Roxan Hockey to get his opinion. -I will see her back in 4 weeks for follow-up.  2.  Family history: -Paternal aunt had lung cancer. -Mother had MDS. -Maternal grandmother had uterine cancer.      Orders placed this encounter:  Orders Placed This Encounter  Procedures  . Pulmonary Function Test      Derek Jack, MD [place] [phone]

## 2019-02-11 ENCOUNTER — Other Ambulatory Visit: Payer: Self-pay

## 2019-02-11 ENCOUNTER — Ambulatory Visit (HOSPITAL_COMMUNITY)
Admission: RE | Admit: 2019-02-11 | Discharge: 2019-02-11 | Disposition: A | Discharge status: Home / Self Care | Payer: BLUE CROSS/BLUE SHIELD | Source: Ambulatory Visit | Attending: Hematology | Admitting: Hematology

## 2019-02-11 DIAGNOSIS — R918 Other nonspecific abnormal finding of lung field: Secondary | ICD-10-CM | POA: Insufficient documentation

## 2019-02-11 LAB — PULMONARY FUNCTION TEST
DL/VA % pred: 67 %
DL/VA: 2.8 ml/min/mmHg/L
DLCO unc % pred: 65 %
DLCO unc: 13.55 ml/min/mmHg
FEF 25-75 Post: 1.22 L/sec
FEF 25-75 Pre: 0.67 L/sec
FEF2575-%Change-Post: 81 %
FEF2575-%Pred-Post: 53 %
FEF2575-%Pred-Pre: 29 %
FEV1-%Change-Post: 25 %
FEV1-%Pred-Post: 81 %
FEV1-%Pred-Pre: 64 %
FEV1-Post: 2.08 L
FEV1-Pre: 1.66 L
FEV1FVC-%Change-Post: 10 %
FEV1FVC-%Pred-Pre: 67 %
FEV6-%Change-Post: 14 %
FEV6-%Pred-Post: 102 %
FEV6-%Pred-Pre: 88 %
FEV6-Post: 3.28 L
FEV6-Pre: 2.86 L
FEV6FVC-%Change-Post: 1 %
FEV6FVC-%Pred-Post: 94 %
FEV6FVC-%Pred-Pre: 93 %
FVC-%Change-Post: 13 %
FVC-%Pred-Post: 108 %
FVC-%Pred-Pre: 95 %
FVC-Post: 3.62 L
FVC-Pre: 3.19 L
Post FEV1/FVC ratio: 58 %
Post FEV6/FVC ratio: 91 %
Pre FEV1/FVC ratio: 52 %
Pre FEV6/FVC Ratio: 90 %
RV % pred: 147 %
RV: 3.09 L
TLC % pred: 117 %
TLC: 6.12 L

## 2019-02-11 MED ORDER — ALBUTEROL SULFATE (2.5 MG/3ML) 0.083% IN NEBU
2.5000 mg | INHALATION_SOLUTION | Freq: Once | RESPIRATORY_TRACT | Status: AC
  Administered 2019-02-11: 2.5 mg via RESPIRATORY_TRACT

## 2019-02-14 ENCOUNTER — Other Ambulatory Visit: Payer: Self-pay

## 2019-02-17 ENCOUNTER — Other Ambulatory Visit: Payer: Self-pay

## 2019-02-17 ENCOUNTER — Encounter: Payer: Self-pay | Admitting: Thoracic Surgery (Cardiothoracic Vascular Surgery)

## 2019-02-17 ENCOUNTER — Institutional Professional Consult (permissible substitution): Payer: BLUE CROSS/BLUE SHIELD | Admitting: Thoracic Surgery (Cardiothoracic Vascular Surgery)

## 2019-02-17 VITALS — BP 116/80 | HR 76 | Temp 97.9°F | Resp 16 | Ht 65.0 in | Wt 107.0 lb

## 2019-02-17 DIAGNOSIS — R918 Other nonspecific abnormal finding of lung field: Secondary | ICD-10-CM

## 2019-02-17 NOTE — H&P (View-Only) (Signed)
PCP is Dione Housekeeper, MD Referring Provider is Derek Jack, MD  Chief Complaint  Patient presents with  . Lung Mass    RUL/RLLobes.Marland KitchenMarland KitchenSurgical eval, Chest CT 01/24/19, PET Scan 02/07/19, Brain MR 02/04/19, PFT's 02/11/19    HPI: Mrs. Carvell is sent for consultation regarding right upper and lower lobe lung masses.  Jirah Rider is a 63 year old woman with a 40-pack-year history of tobacco abuse (1 pack/day for 40 years) who presented with abdominal cramps and diarrhea.  She had a CT of the abdomen and pelvis to evaluate for colitis.  It showed a right lower lobe lung nodule.  A CT of the chest showed a 3.6 x 3.3 cm spiculated mass in the right apex and a 3.1 x 2.3 cm cavitary and spiculated mass in the right lower lobe.  There was no adenopathy.  PET/CT showed both of these lesions were hypermetabolic with SUVs of 10.2 and 13.6 respectively.  There was no evidence of regional or distant metastatic disease.  An MRI of the brain showed possible areas of multiple sclerosis, there were no metastases.  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  She can walk over a mile on level ground without getting short of breath.  She does not have any chest pain, pressure, or tightness with exertion.  She does get short of breath if she is walking up an incline.  She feels she can walk up 2 flights of stairs without stopping.  She would be short of breath when she got to the top.  She is not having any cough, hemoptysis, wheezing, headaches, visual changes, or new bone or joint pain.  She has lost about 10 pounds over the past 3 months.  A lot of that was around the time she was having trouble with diarrhea.  She quit smoking about 2 weeks ago.  She tried Chantix initially but had bad dreams.  She currently is going cold [place].  Zubrod Score: At the time of surgery this patient's most appropriate activity status/level should be described as: [x]     0    Normal activity, no symptoms []     1    Restricted in  physical strenuous activity but ambulatory, able to do out light work []     2    Ambulatory and capable of self care, unable to do work activities, up and about >50 % of waking hours                              []     3    Only limited self care, in bed greater than 50% of waking hours []     4    Completely disabled, no self care, confined to bed or chair []     5    Moribund   Past medical history Tobacco abuse Seasonal allergies  Past Surgical History:  Procedure Laterality Date  . TONSILLECTOMY    . TUBAL LIGATION      Family History  Problem Relation Age of Onset  . Myelodysplastic syndrome Mother   . Clotting disorder Father   . Lung cancer Paternal Aunt     Social History Social History   Tobacco Use  . Smoking status: Current Every Day Smoker    Packs/day: 1.00    Types: Cigarettes  . Smokeless tobacco: Never Used  Substance Use Topics  . Alcohol use: Not Currently  . Drug use: Not Currently    Current Outpatient Medications  Medication Sig Dispense Refill  . dicyclomine (BENTYL) 20 MG tablet Take 20 mg by mouth every 6 (six) hours as needed.     . sertraline (ZOLOFT) 100 MG tablet Take 100 mg by mouth daily.     . ondansetron (ZOFRAN) 8 MG tablet Take 8 mg by mouth every 8 (eight) hours as needed for nausea or vomiting.     No current facility-administered medications for this visit.     Allergies  Allergen Reactions  . Demerol [Meperidine Hcl] Swelling    Swelling around IV site    Review of Systems  Constitutional: Positive for appetite change and unexpected weight change (lost 10 pounds last 3 months). Negative for activity change, fatigue and fever.  HENT: Negative for trouble swallowing and voice change.   Eyes: Negative for visual disturbance.  Respiratory: Negative for shortness of breath and wheezing.   Cardiovascular: Negative for chest pain, palpitations and leg swelling.  Gastrointestinal: Positive for diarrhea. Negative for abdominal pain.   Musculoskeletal: Positive for arthralgias and joint swelling.  Neurological: Negative for syncope, weakness and headaches.  Hematological: Does not bruise/bleed easily.  Psychiatric/Behavioral: The patient is nervous/anxious.   All other systems reviewed and are negative.   BP 116/80 (BP Location: Right Arm, Patient Position: Sitting, Cuff Size: Normal)   Pulse 76   Temp 97.9 F (36.6 C) (Oral)   Resp 16   Ht 5\' 5"  (1.651 m)   Wt 107 lb (48.5 kg)   SpO2 97% Comment: RA  BMI 17.81 kg/m  Physical Exam Vitals signs reviewed.  Constitutional:      General: She is not in acute distress.    Appearance: Normal appearance.  HENT:     Head: Normocephalic and atraumatic.     Comments: Wearing surgical mask Eyes:     General: No scleral icterus.    Extraocular Movements: Extraocular movements intact.     Pupils: Pupils are equal, round, and reactive to light.  Neck:     Musculoskeletal: Neck supple.  Cardiovascular:     Rate and Rhythm: Normal rate and regular rhythm.     Heart sounds: Normal heart sounds. No murmur. No friction rub. No gallop.   Pulmonary:     Effort: Pulmonary effort is normal. No respiratory distress.     Breath sounds: Normal breath sounds. No wheezing or rales.  Abdominal:     General: There is no distension.     Palpations: Abdomen is soft.     Tenderness: There is no abdominal tenderness.  Musculoskeletal:        General: No swelling.  Lymphadenopathy:     Cervical: No cervical adenopathy.  Skin:    General: Skin is warm and dry.  Neurological:     General: No focal deficit present.     Mental Status: She is alert and oriented to person, place, and time.     Cranial Nerves: No cranial nerve deficit.     Motor: No weakness.     Coordination: Coordination normal.  Psychiatric:        Mood and Affect: Mood normal.    Diagnostic Tests: CT CHEST WITH CONTRAST  TECHNIQUE: Multidetector CT imaging of the chest was performed during intravenous  contrast administration.  CONTRAST:  75mL OMNIPAQUE IOHEXOL 300 MG/ML  SOLN  COMPARISON:  CT scan of January 03, 2019.  FINDINGS: Cardiovascular: Atherosclerosis of thoracic aorta is noted without aneurysm or dissection. Normal cardiac size. No pericardial effusion.  Mediastinum/Nodes: No enlarged mediastinal, hilar, or axillary lymph  nodes. Thyroid gland, trachea, and esophagus demonstrate no significant findings.  Lungs/Pleura: 3.6 x 3.3 cm spiculated mass is noted in the right lung apex highly concerning for malignancy. Also noted is 3.1 x 2.3 cm cavitary and spiculated mass in the right lower lobe concerning for malignancy. No pneumothorax or pleural effusion is noted. Left lung is unremarkable.  Upper Abdomen: No acute abnormality.  Musculoskeletal: No chest wall abnormality. No acute or significant osseous findings.  IMPRESSION: 3.6 cm spiculated mass seen in right lung base, as well as 3.1 cm cavitary spiculated mass seen in the right lower lobe. Both of these are concerning for malignancy or possibly metastatic disease. PET scan is recommended for further evaluation. These results will be called to the ordering clinician or representative by the Radiologist Assistant, and communication documented in the PACS or zVision Dashboard.  Aortic Atherosclerosis (ICD10-I70.0).   Electronically Signed   By: Marijo Conception, M.D.   On: 01/24/2019 14:20 NUCLEAR MEDICINE PET SKULL BASE TO THIGH  TECHNIQUE: 5.4 mCi F-18 FDG was injected intravenously. Full-ring PET imaging was performed from the skull base to thigh after the radiotracer. CT data was obtained and used for attenuation correction and anatomic localization.  Fasting blood glucose: 100 mg/dl  COMPARISON:  Chest CT 01/24/2019.  Abdominopelvic CT of 01/03/2019.  FINDINGS: Mediastinal blood pool activity: SUV max 2.2  NECK: No areas of abnormal hypermetabolism.  Incidental CT findings: No  cervical adenopathy.  CHEST: Spiculated hypermetabolic right upper apical lung mass. This measures 2.9 x 2.1 cm and a S.U.V. max of 11.9 on image 17/8.  Spiculated right lower lobe lung mass measures 3.5 x 2.6 cm and a S.U.V. max of 13.6 on image 60/8.  No thoracic nodal hypermetabolism.  Incidental CT findings: Deferred to recent diagnostic CT. Centrilobular and paraseptal emphysema.  ABDOMEN/PELVIS: No abdominopelvic parenchymal or nodal hypermetabolism.  Incidental CT findings: Normal right adrenal gland. Mild left adrenal thickening. Colonic stool burden suggests constipation. Abdominal aortic atherosclerosis. Tubal ligation. Trace fluid within the pelvis, new since 01/03/2019.  SKELETON: No abnormal marrow activity.  Incidental CT findings: Mild bilateral hip osteoarthritis.  IMPRESSION: 1. Hypermetabolic right upper and right lower lobe lung masses, consistent with synchronous primary bronchogenic carcinomas. 2. No evidence of hypermetabolic thoracic nodal or extrathoracic metastasis. 3.  Aortic Atherosclerosis (ICD10-I70.0).  Emphysema (ICD10-J43.9). 4. Small volume nonspecific pelvic fluid, new since 01/03/2019.   Electronically Signed   By: Abigail Miyamoto M.D.   On: 02/07/2019 12:13 I personally reviewed the CT and PET/CT images and concur with the findings noted above  Pulmonary function testing FVC 3.19 (95%) FEV1 1.66 (64%) FEV1 2.08 (81%) postbronchodilator TLC 6.12 (117%) DLCO 13.55 (65%)  Impression: Endora Teresi is a 63 year old woman with a history of tobacco abuse who was incidentally found to have a lung nodule on a CT of the abdomen and pelvis that was done to evaluate for colitis.  A CT of the chest then showed right upper and right lower lobe lung masses.  These were both spiculated lesions greater than 3 cm in largest diameter.  There is no evidence of regional or distant metastatic disease.  Differential diagnosis includes synchronous  primary lung cancers, AFB or fungal disease, or inflammatory nodules.  Given her age, smoking history, and appearance of the nodules bronchogenic carcinoma is the most likely scenario and these lesions have to be considered that unless it can be proven otherwise.  Clinical stage would be synchronous T2, N0 stage IB lesions.  We discussed potential  diagnostic and treatment options.  These lesions could be approached for biopsy with navigational bronchoscopy and then potentially treated with radiation therapy.  I think there is a low likelihood that she would have both lesions cured with radiation.  Another option would be to proceed directly to surgery for definitive diagnosis and treatment.  Although large both lesions are peripheral and potentially treatable with segmental resections rather than requiring a pneumonectomy.  She does have adequate pulmonary reserve to tolerate a pneumonectomy should it be necessary intraoperatively.  I described the proposed operative procedure of right VATS for upper and lower lobe segmentectomies with Mrs. Dietzman.  She understands that a larger resection up to possible pneumonectomy may be necessary.  She understands that she has a higher than normal chance of requiring a conversion to thoracotomy.  I informed her of the general nature of the procedure including the need for general anesthesia, the incision to be used, the use of a drainage tube postoperatively, the expected hospital stay, and the overall recovery.  I informed her of the indications, risk, benefits, and alternatives.  She understands the risks include, but not limited to death, MI, DVT, PE, bleeding, possible need for transfusion, infection, prolonged air leak, cardiac arrhythmias, as well as possibility of other unforeseeable complications.  She accepts the risks and wishes to proceed  Plan: Right VATS for upper lobe and lower lobe segmentectomies on Thursday, 02/27/2019  [name], MD [facility] [phone]

## 2019-02-17 NOTE — Progress Notes (Signed)
PCP is Dione Housekeeper, MD Referring Provider is Derek Jack, MD  Chief Complaint  Patient presents with  . Lung Mass    RUL/RLLobes.Marland KitchenMarland KitchenSurgical eval, Chest CT 01/24/19, PET Scan 02/07/19, Brain MR 02/04/19, PFT's 02/11/19    HPI: Madeline Gates is sent for consultation regarding right upper and lower lobe lung masses.  Madeline Gates is a 63 year old woman with a 40-pack-year history of tobacco abuse (1 pack/day for 40 years) who presented with abdominal cramps and diarrhea.  She had a CT of the abdomen and pelvis to evaluate for colitis.  It showed a right lower lobe lung nodule.  A CT of the chest showed a 3.6 x 3.3 cm spiculated mass in the right apex and a 3.1 x 2.3 cm cavitary and spiculated mass in the right lower lobe.  There was no adenopathy.  PET/CT showed both of these lesions were hypermetabolic with SUVs of 73.2 and 13.6 respectively.  There was no evidence of regional or distant metastatic disease.  An MRI of the brain showed possible areas of multiple sclerosis, there were no metastases.  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  She can walk over a mile on level ground without getting short of breath.  She does not have any chest pain, pressure, or tightness with exertion.  She does get short of breath if she is walking up an incline.  She feels she can walk up 2 flights of stairs without stopping.  She would be short of breath when she got to the top.  She is not having any cough, hemoptysis, wheezing, headaches, visual changes, or new bone or joint pain.  She has lost about 10 pounds over the past 3 months.  A lot of that was around the time she was having trouble with diarrhea.  She quit smoking about 2 weeks ago.  She tried Chantix initially but had bad dreams.  She currently is going cold [place].  Zubrod Score: At the time of surgery this patient's most appropriate activity status/level should be described as: [x]     0    Normal activity, no symptoms []     1    Restricted in  physical strenuous activity but ambulatory, able to do out light work []     2    Ambulatory and capable of self care, unable to do work activities, up and about >50 % of waking hours                              []     3    Only limited self care, in bed greater than 50% of waking hours []     4    Completely disabled, no self care, confined to bed or chair []     5    Moribund   Past medical history Tobacco abuse Seasonal allergies  Past Surgical History:  Procedure Laterality Date  . TONSILLECTOMY    . TUBAL LIGATION      Family History  Problem Relation Age of Onset  . Myelodysplastic syndrome Mother   . Clotting disorder Father   . Lung cancer Paternal Aunt     Social History Social History   Tobacco Use  . Smoking status: Current Every Day Smoker    Packs/day: 1.00    Types: Cigarettes  . Smokeless tobacco: Never Used  Substance Use Topics  . Alcohol use: Not Currently  . Drug use: Not Currently    Current Outpatient Medications  Medication Sig Dispense Refill  . dicyclomine (BENTYL) 20 MG tablet Take 20 mg by mouth every 6 (six) hours as needed.     . sertraline (ZOLOFT) 100 MG tablet Take 100 mg by mouth daily.     . ondansetron (ZOFRAN) 8 MG tablet Take 8 mg by mouth every 8 (eight) hours as needed for nausea or vomiting.     No current facility-administered medications for this visit.     Allergies  Allergen Reactions  . Demerol [Meperidine Hcl] Swelling    Swelling around IV site    Review of Systems  Constitutional: Positive for appetite change and unexpected weight change (lost 10 pounds last 3 months). Negative for activity change, fatigue and fever.  HENT: Negative for trouble swallowing and voice change.   Eyes: Negative for visual disturbance.  Respiratory: Negative for shortness of breath and wheezing.   Cardiovascular: Negative for chest pain, palpitations and leg swelling.  Gastrointestinal: Positive for diarrhea. Negative for abdominal pain.   Musculoskeletal: Positive for arthralgias and joint swelling.  Neurological: Negative for syncope, weakness and headaches.  Hematological: Does not bruise/bleed easily.  Psychiatric/Behavioral: The patient is nervous/anxious.   All other systems reviewed and are negative.   BP 116/80 (BP Location: Right Arm, Patient Position: Sitting, Cuff Size: Normal)   Pulse 76   Temp 97.9 F (36.6 C) (Oral)   Resp 16   Ht 5\' 5"  (1.651 m)   Wt 107 lb (48.5 kg)   SpO2 97% Comment: RA  BMI 17.81 kg/m  Physical Exam Vitals signs reviewed.  Constitutional:      General: She is not in acute distress.    Appearance: Normal appearance.  HENT:     Head: Normocephalic and atraumatic.     Comments: Wearing surgical mask Eyes:     General: No scleral icterus.    Extraocular Movements: Extraocular movements intact.     Pupils: Pupils are equal, round, and reactive to light.  Neck:     Musculoskeletal: Neck supple.  Cardiovascular:     Rate and Rhythm: Normal rate and regular rhythm.     Heart sounds: Normal heart sounds. No murmur. No friction rub. No gallop.   Pulmonary:     Effort: Pulmonary effort is normal. No respiratory distress.     Breath sounds: Normal breath sounds. No wheezing or rales.  Abdominal:     General: There is no distension.     Palpations: Abdomen is soft.     Tenderness: There is no abdominal tenderness.  Musculoskeletal:        General: No swelling.  Lymphadenopathy:     Cervical: No cervical adenopathy.  Skin:    General: Skin is warm and dry.  Neurological:     General: No focal deficit present.     Mental Status: She is alert and oriented to person, place, and time.     Cranial Nerves: No cranial nerve deficit.     Motor: No weakness.     Coordination: Coordination normal.  Psychiatric:        Mood and Affect: Mood normal.    Diagnostic Tests: CT CHEST WITH CONTRAST  TECHNIQUE: Multidetector CT imaging of the chest was performed during intravenous  contrast administration.  CONTRAST:  75mL OMNIPAQUE IOHEXOL 300 MG/ML  SOLN  COMPARISON:  CT scan of January 03, 2019.  FINDINGS: Cardiovascular: Atherosclerosis of thoracic aorta is noted without aneurysm or dissection. Normal cardiac size. No pericardial effusion.  Mediastinum/Nodes: No enlarged mediastinal, hilar, or axillary lymph  nodes. Thyroid gland, trachea, and esophagus demonstrate no significant findings.  Lungs/Pleura: 3.6 x 3.3 cm spiculated mass is noted in the right lung apex highly concerning for malignancy. Also noted is 3.1 x 2.3 cm cavitary and spiculated mass in the right lower lobe concerning for malignancy. No pneumothorax or pleural effusion is noted. Left lung is unremarkable.  Upper Abdomen: No acute abnormality.  Musculoskeletal: No chest wall abnormality. No acute or significant osseous findings.  IMPRESSION: 3.6 cm spiculated mass seen in right lung base, as well as 3.1 cm cavitary spiculated mass seen in the right lower lobe. Both of these are concerning for malignancy or possibly metastatic disease. PET scan is recommended for further evaluation. These results will be called to the ordering clinician or representative by the Radiologist Assistant, and communication documented in the PACS or zVision Dashboard.  Aortic Atherosclerosis (ICD10-I70.0).   Electronically Signed   By: Marijo Conception, M.D.   On: 01/24/2019 14:20 NUCLEAR MEDICINE PET SKULL BASE TO THIGH  TECHNIQUE: 5.4 mCi F-18 FDG was injected intravenously. Full-ring PET imaging was performed from the skull base to thigh after the radiotracer. CT data was obtained and used for attenuation correction and anatomic localization.  Fasting blood glucose: 100 mg/dl  COMPARISON:  Chest CT 01/24/2019.  Abdominopelvic CT of 01/03/2019.  FINDINGS: Mediastinal blood pool activity: SUV max 2.2  NECK: No areas of abnormal hypermetabolism.  Incidental CT findings: No  cervical adenopathy.  CHEST: Spiculated hypermetabolic right upper apical lung mass. This measures 2.9 x 2.1 cm and a S.U.V. max of 11.9 on image 17/8.  Spiculated right lower lobe lung mass measures 3.5 x 2.6 cm and a S.U.V. max of 13.6 on image 60/8.  No thoracic nodal hypermetabolism.  Incidental CT findings: Deferred to recent diagnostic CT. Centrilobular and paraseptal emphysema.  ABDOMEN/PELVIS: No abdominopelvic parenchymal or nodal hypermetabolism.  Incidental CT findings: Normal right adrenal gland. Mild left adrenal thickening. Colonic stool burden suggests constipation. Abdominal aortic atherosclerosis. Tubal ligation. Trace fluid within the pelvis, new since 01/03/2019.  SKELETON: No abnormal marrow activity.  Incidental CT findings: Mild bilateral hip osteoarthritis.  IMPRESSION: 1. Hypermetabolic right upper and right lower lobe lung masses, consistent with synchronous primary bronchogenic carcinomas. 2. No evidence of hypermetabolic thoracic nodal or extrathoracic metastasis. 3.  Aortic Atherosclerosis (ICD10-I70.0).  Emphysema (ICD10-J43.9). 4. Small volume nonspecific pelvic fluid, new since 01/03/2019.   Electronically Signed   By: Abigail Miyamoto M.D.   On: 02/07/2019 12:13 I personally reviewed the CT and PET/CT images and concur with the findings noted above  Pulmonary function testing FVC 3.19 (95%) FEV1 1.66 (64%) FEV1 2.08 (81%) postbronchodilator TLC 6.12 (117%) DLCO 13.55 (65%)  Impression: Madeline Gates is a 63 year old woman with a history of tobacco abuse who was incidentally found to have a lung nodule on a CT of the abdomen and pelvis that was done to evaluate for colitis.  A CT of the chest then showed right upper and right lower lobe lung masses.  These were both spiculated lesions greater than 3 cm in largest diameter.  There is no evidence of regional or distant metastatic disease.  Differential diagnosis includes synchronous  primary lung cancers, AFB or fungal disease, or inflammatory nodules.  Given her age, smoking history, and appearance of the nodules bronchogenic carcinoma is the most likely scenario and these lesions have to be considered that unless it can be proven otherwise.  Clinical stage would be synchronous T2, N0 stage IB lesions.  We discussed potential  diagnostic and treatment options.  These lesions could be approached for biopsy with navigational bronchoscopy and then potentially treated with radiation therapy.  I think there is a low likelihood that she would have both lesions cured with radiation.  Another option would be to proceed directly to surgery for definitive diagnosis and treatment.  Although large both lesions are peripheral and potentially treatable with segmental resections rather than requiring a pneumonectomy.  She does have adequate pulmonary reserve to tolerate a pneumonectomy should it be necessary intraoperatively.  I described the proposed operative procedure of right VATS for upper and lower lobe segmentectomies with Mrs. Edgin.  She understands that a larger resection up to possible pneumonectomy may be necessary.  She understands that she has a higher than normal chance of requiring a conversion to thoracotomy.  I informed her of the general nature of the procedure including the need for general anesthesia, the incision to be used, the use of a drainage tube postoperatively, the expected hospital stay, and the overall recovery.  I informed her of the indications, risk, benefits, and alternatives.  She understands the risks include, but not limited to death, MI, DVT, PE, bleeding, possible need for transfusion, infection, prolonged air leak, cardiac arrhythmias, as well as possibility of other unforeseeable complications.  She accepts the risks and wishes to proceed  Plan: Right VATS for upper lobe and lower lobe segmentectomies on Thursday, 02/27/2019  [name], MD [facility] [phone]

## 2019-02-18 ENCOUNTER — Other Ambulatory Visit: Payer: Self-pay | Admitting: *Deleted

## 2019-02-18 ENCOUNTER — Encounter: Payer: Self-pay | Admitting: *Deleted

## 2019-02-18 ENCOUNTER — Encounter (HOSPITAL_COMMUNITY): Payer: Self-pay

## 2019-02-18 DIAGNOSIS — R918 Other nonspecific abnormal finding of lung field: Secondary | ICD-10-CM

## 2019-02-24 ENCOUNTER — Ambulatory Visit (HOSPITAL_COMMUNITY): Payer: BLUE CROSS/BLUE SHIELD | Admitting: Hematology

## 2019-02-24 NOTE — Pre-Procedure Instructions (Signed)
Madeline Gates  02/24/2019    Your procedure is scheduled on Thursday, Feb 27, 2019 at 8:00 AM.   Report to [facility] Entrance "A" Admitting Office at 6:00 AM.   Call this number if you have problems the morning of surgery: [phone]   Questions prior to day of surgery, please call [phone] between 8 & 4 PM.   Remember:  Do not eat or drink after midnight Wednesday, 02/26/19.  Take these medicines the morning of surgery with A SIP OF WATER: Tylenol - if needed, Dicyclomine (Bentyl) - if needed.  Stop Multivitamins as of today. Do not use NSAIDS (Ibuprofen, Aleve, etc), Aspirin products or Herbal medications prior to surgery.  Remember to self quarantine after getting the Covid test done today.    Do not wear jewelry, make-up or nail polish.  Do not wear lotions, powders, perfumes or deodorant.  Do not shave 48 hours prior to surgery.   Do not bring valuables to the hospital.  [facility] is not responsible for any belongings or valuables.  Contacts, dentures or bridgework may not be worn into surgery.  Leave your suitcase in the car.  After surgery it may be brought to your room.  For patients admitted to the hospital, discharge time will be determined by your treatment team.  [facility] - Preparing for Surgery  Before surgery, you can play an important role.  Because skin is not sterile, your skin needs to be as free of germs as possible.  You can reduce the number of germs on you skin by washing with CHG (chlorahexidine gluconate) soap before surgery.  CHG is an antiseptic cleaner which kills germs and bonds with the skin to continue killing germs even after washing.  Oral Hygiene is also important in reducing the risk of infection.  Remember to brush your teeth with your regular toothpaste the morning of surgery.  Please DO NOT use if you have an allergy to CHG or antibacterial soaps.  If your skin becomes reddened/irritated stop using the CHG and inform your  nurse when you arrive at Short Stay.  Do not shave (including legs and underarms) for at least 48 hours prior to the first CHG shower.  You may shave your face.  Please follow these instructions carefully:   1.  Shower with CHG Soap the night before surgery and the morning of Surgery.  2.  If you choose to wash your hair, wash your hair first as usual with your normal shampoo.  3.  After you shampoo, rinse your hair and body thoroughly to remove the shampoo. 4.  Use CHG as you would any other liquid soap.  You can apply chg directly to the skin and wash gently with a      scrungie or washcloth.           5.  Apply the CHG Soap to your body ONLY FROM THE NECK DOWN.   Do not use on open wounds or open sores. Avoid contact with your eyes, ears, mouth and genitals (private parts).  Wash genitals (private parts) with your normal soap.  6.  Wash thoroughly, paying special attention to the area where your surgery will be performed.  7.  Thoroughly rinse your body with warm water from the neck down.  8.  DO NOT shower/wash with your normal soap after using and rinsing off the CHG Soap.  9.  Pat yourself dry with a clean towel.  10.  Wear clean pajamas.            11.  Place clean sheets on your bed the night of your first shower and do not sleep with pets.  Day of Surgery  Shower as above. Do not apply any lotions/deodorants the morning of surgery.   Please wear clean clothes to the hospital. Remember to brush your teeth with toothpaste.   Please read over the fact sheets that you were given.

## 2019-02-25 ENCOUNTER — Other Ambulatory Visit (HOSPITAL_COMMUNITY)
Admission: RE | Admit: 2019-02-25 | Discharge: 2019-02-25 | Disposition: A | Discharge status: Home / Self Care | Payer: BLUE CROSS/BLUE SHIELD | Source: Ambulatory Visit | Attending: Thoracic Surgery (Cardiothoracic Vascular Surgery) | Admitting: Thoracic Surgery (Cardiothoracic Vascular Surgery)

## 2019-02-25 ENCOUNTER — Encounter (HOSPITAL_COMMUNITY): Payer: Self-pay

## 2019-02-25 ENCOUNTER — Encounter (HOSPITAL_COMMUNITY)
Admission: RE | Admit: 2019-02-25 | Discharge: 2019-02-25 | Disposition: A | Discharge status: Home / Self Care | Payer: BLUE CROSS/BLUE SHIELD | Source: Ambulatory Visit | Attending: Thoracic Surgery (Cardiothoracic Vascular Surgery) | Admitting: Thoracic Surgery (Cardiothoracic Vascular Surgery)

## 2019-02-25 ENCOUNTER — Other Ambulatory Visit: Payer: Self-pay

## 2019-02-25 ENCOUNTER — Ambulatory Visit (HOSPITAL_COMMUNITY)
Admission: RE | Admit: 2019-02-25 | Discharge: 2019-02-25 | Disposition: A | Discharge status: Home / Self Care | Payer: BLUE CROSS/BLUE SHIELD | Source: Ambulatory Visit | Attending: Thoracic Surgery (Cardiothoracic Vascular Surgery) | Admitting: Thoracic Surgery (Cardiothoracic Vascular Surgery)

## 2019-02-25 DIAGNOSIS — Z01818 Encounter for other preprocedural examination: Secondary | ICD-10-CM | POA: Diagnosis not present

## 2019-02-25 DIAGNOSIS — Z1159 Encounter for screening for other viral diseases: Secondary | ICD-10-CM | POA: Insufficient documentation

## 2019-02-25 DIAGNOSIS — R918 Other nonspecific abnormal finding of lung field: Secondary | ICD-10-CM | POA: Diagnosis present

## 2019-02-25 HISTORY — DX: Irritable bowel syndrome, unspecified: K58.9

## 2019-02-25 HISTORY — DX: Other nonspecific abnormal finding of lung field: R91.8

## 2019-02-25 HISTORY — DX: Anxiety disorder, unspecified: F41.9

## 2019-02-25 LAB — COMPREHENSIVE METABOLIC PANEL
ALT: 18 U/L (ref 0–44)
AST: 24 U/L (ref 15–41)
Albumin: 4.2 g/dL (ref 3.5–5.0)
Alkaline Phosphatase: 82 U/L (ref 38–126)
Anion gap: 11 (ref 5–15)
BUN: 6 mg/dL — ABNORMAL LOW (ref 8–23)
CO2: 23 mmol/L (ref 22–32)
Calcium: 9.8 mg/dL (ref 8.9–10.3)
Chloride: 106 mmol/L (ref 98–111)
Creatinine, Ser: 0.7 mg/dL (ref 0.44–1.00)
GFR calc Af Amer: >60 mL/min (ref >60)
GFR calc non Af Amer: >60 mL/min (ref >60)
Glucose, Bld: 104 mg/dL — ABNORMAL HIGH (ref 70–99)
Potassium: 4 mmol/L (ref 3.5–5.1)
Sodium: 140 mmol/L (ref 135–145)
Total Bilirubin: 0.6 mg/dL (ref 0.3–1.2)
Total Protein: 7.2 g/dL (ref 6.5–8.1)

## 2019-02-25 LAB — TYPE AND SCREEN
ABO/RH(D): A POS
Antibody Screen: NEGATIVE

## 2019-02-25 LAB — BLOOD GAS, ARTERIAL
Acid-Base Excess: 1.9 mmol/L (ref 0.0–2.0)
Bicarbonate: 25.9 mmol/L (ref 20.0–28.0)
Drawn by: 421801
FIO2: 21
O2 Saturation: 97.3 %
Patient temperature: 98.6
pCO2 arterial: 39.5 mmHg (ref 32.0–48.0)
pH, Arterial: 7.432 (ref 7.350–7.450)
pO2, Arterial: 95.4 mmHg (ref 83.0–108.0)

## 2019-02-25 LAB — CBC
HCT: 39.8 % (ref 36.0–46.0)
Hemoglobin: 13 g/dL (ref 12.0–15.0)
MCH: 30.7 pg (ref 26.0–34.0)
MCHC: 32.7 g/dL (ref 30.0–36.0)
MCV: 93.9 fL (ref 80.0–100.0)
Platelets: 495 10*3/uL — ABNORMAL HIGH (ref 150–400)
RBC: 4.24 MIL/uL (ref 3.87–5.11)
RDW: 14.6 % (ref 11.5–15.5)
WBC: 12.7 10*3/uL — ABNORMAL HIGH (ref 4.0–10.5)
nRBC: 0 % (ref 0.0–0.2)

## 2019-02-25 LAB — URINALYSIS, ROUTINE W REFLEX MICROSCOPIC
Bilirubin Urine: NEGATIVE
Glucose, UA: NEGATIVE mg/dL
Hgb urine dipstick: NEGATIVE
Ketones, ur: NEGATIVE mg/dL
Leukocytes,Ua: NEGATIVE
Nitrite: NEGATIVE
Protein, ur: NEGATIVE mg/dL
Specific Gravity, Urine: 1.01 (ref 1.005–1.030)
pH: 7 (ref 5.0–8.0)

## 2019-02-25 LAB — ABO/RH: ABO/RH(D): A POS

## 2019-02-25 LAB — PROTIME-INR
INR: 0.9 (ref 0.8–1.2)
Prothrombin Time: 12.5 seconds (ref 11.4–15.2)

## 2019-02-25 LAB — APTT: aPTT: 32 seconds (ref 24–36)

## 2019-02-25 LAB — SURGICAL PCR SCREEN
MRSA, PCR: NEGATIVE
Staphylococcus aureus: NEGATIVE

## 2019-02-26 ENCOUNTER — Encounter (HOSPITAL_COMMUNITY): Payer: Self-pay | Admitting: Registered Nurse

## 2019-02-26 LAB — NOVEL CORONAVIRUS, NAA (HOSP ORDER, SEND-OUT TO REF LAB; TAT 18-24 HRS): SARS-CoV-2, NAA: NOT DETECTED

## 2019-02-26 NOTE — Anesthesia Preprocedure Evaluation (Addendum)
Anesthesia Evaluation  Patient identified by MRN, date of birth, ID band Patient awake    Reviewed: Allergy & Precautions, NPO status , Patient's Chart, lab work & pertinent test results  History of Anesthesia Complications Negative for: history of anesthetic complications  Airway Mallampati: III  TM Distance: >3 FB Neck ROM: Full    Dental  (+) Dental Advisory Given, Teeth Intact   Pulmonary former smoker,   Lung nodules    breath sounds clear to auscultation       Cardiovascular negative cardio ROS   Rhythm:Regular Rate:Normal     Neuro/Psych PSYCHIATRIC DISORDERS Anxiety Depression negative neurological ROS     GI/Hepatic Neg liver ROS,  IBS    Endo/Other  negative endocrine ROS  Renal/GU negative Renal ROS     Musculoskeletal negative musculoskeletal ROS (+)   Abdominal   Peds  Hematology negative hematology ROS (+)   Anesthesia Other Findings   Reproductive/Obstetrics                            Anesthesia Physical Anesthesia Plan  ASA: II  Anesthesia Plan: General   Post-op Pain Management:    Induction: Intravenous  PONV Risk Score and Plan: 4 or greater and Treatment may vary due to age or medical condition, Ondansetron, Dexamethasone, Scopolamine patch - Pre-op and Midazolam  Airway Management Planned: Double Lumen EBT  Additional Equipment: Arterial line  Intra-op Plan:   Post-operative Plan: Possible Post-op intubation/ventilation  Informed Consent: I have reviewed the patients History and Physical, chart, labs and discussed the procedure including the risks, benefits and alternatives for the proposed anesthesia with the patient or authorized representative who has indicated his/her understanding and acceptance.     Dental advisory given  Plan Discussed with: CRNA and Anesthesiologist  Anesthesia Plan Comments: ( 2 large bore peripheral IVs vs. CVL if  unable to get adequate peripheral access)       Anesthesia Quick Evaluation

## 2019-02-27 ENCOUNTER — Inpatient Hospital Stay (HOSPITAL_COMMUNITY): Payer: BC Managed Care – PPO | Admitting: Registered Nurse

## 2019-02-27 ENCOUNTER — Encounter (HOSPITAL_COMMUNITY)
Admission: RE | Disposition: A | Discharge status: Home Health | Payer: Self-pay | Source: Home / Self Care | Attending: Thoracic Surgery (Cardiothoracic Vascular Surgery)

## 2019-02-27 ENCOUNTER — Encounter (HOSPITAL_COMMUNITY): Payer: Self-pay

## 2019-02-27 ENCOUNTER — Inpatient Hospital Stay (HOSPITAL_COMMUNITY): Payer: BC Managed Care – PPO

## 2019-02-27 ENCOUNTER — Inpatient Hospital Stay (HOSPITAL_COMMUNITY)
Admission: RE | Admit: 2019-02-27 | Discharge: 2019-03-12 | DRG: 164 | Disposition: A | Discharge status: Home Health | Payer: BC Managed Care – PPO | Attending: Thoracic Surgery (Cardiothoracic Vascular Surgery) | Admitting: Thoracic Surgery (Cardiothoracic Vascular Surgery)

## 2019-02-27 ENCOUNTER — Other Ambulatory Visit: Payer: Self-pay

## 2019-02-27 DIAGNOSIS — F411 Generalized anxiety disorder: Secondary | ICD-10-CM | POA: Diagnosis present

## 2019-02-27 DIAGNOSIS — T801XXA Vascular complications following infusion, transfusion and therapeutic injection, initial encounter: Secondary | ICD-10-CM | POA: Diagnosis not present

## 2019-02-27 DIAGNOSIS — J95811 Postprocedural pneumothorax: Secondary | ICD-10-CM | POA: Diagnosis not present

## 2019-02-27 DIAGNOSIS — F17210 Nicotine dependence, cigarettes, uncomplicated: Secondary | ICD-10-CM | POA: Diagnosis present

## 2019-02-27 DIAGNOSIS — L03113 Cellulitis of right upper limb: Secondary | ICD-10-CM | POA: Diagnosis present

## 2019-02-27 DIAGNOSIS — Z09 Encounter for follow-up examination after completed treatment for conditions other than malignant neoplasm: Secondary | ICD-10-CM

## 2019-02-27 DIAGNOSIS — I808 Phlebitis and thrombophlebitis of other sites: Secondary | ICD-10-CM | POA: Diagnosis not present

## 2019-02-27 DIAGNOSIS — J95812 Postprocedural air leak: Secondary | ICD-10-CM | POA: Diagnosis not present

## 2019-02-27 DIAGNOSIS — C3411 Malignant neoplasm of upper lobe, right bronchus or lung: Secondary | ICD-10-CM | POA: Diagnosis present

## 2019-02-27 DIAGNOSIS — Z832 Family history of diseases of the blood and blood-forming organs and certain disorders involving the immune mechanism: Secondary | ICD-10-CM

## 2019-02-27 DIAGNOSIS — Z888 Allergy status to other drugs, medicaments and biological substances status: Secondary | ICD-10-CM

## 2019-02-27 DIAGNOSIS — J90 Pleural effusion, not elsewhere classified: Secondary | ICD-10-CM | POA: Diagnosis present

## 2019-02-27 DIAGNOSIS — R918 Other nonspecific abnormal finding of lung field: Secondary | ICD-10-CM | POA: Diagnosis present

## 2019-02-27 DIAGNOSIS — J939 Pneumothorax, unspecified: Secondary | ICD-10-CM

## 2019-02-27 DIAGNOSIS — Z801 Family history of malignant neoplasm of trachea, bronchus and lung: Secondary | ICD-10-CM

## 2019-02-27 DIAGNOSIS — I4891 Unspecified atrial fibrillation: Secondary | ICD-10-CM | POA: Diagnosis not present

## 2019-02-27 DIAGNOSIS — C3431 Malignant neoplasm of lower lobe, right bronchus or lung: Secondary | ICD-10-CM | POA: Diagnosis not present

## 2019-02-27 DIAGNOSIS — T8182XA Emphysema (subcutaneous) resulting from a procedure, initial encounter: Secondary | ICD-10-CM | POA: Diagnosis not present

## 2019-02-27 DIAGNOSIS — B370 Candidal stomatitis: Secondary | ICD-10-CM | POA: Diagnosis not present

## 2019-02-27 DIAGNOSIS — Z902 Acquired absence of lung [part of]: Secondary | ICD-10-CM

## 2019-02-27 DIAGNOSIS — Z9889 Other specified postprocedural states: Secondary | ICD-10-CM

## 2019-02-27 DIAGNOSIS — J302 Other seasonal allergic rhinitis: Secondary | ICD-10-CM | POA: Diagnosis present

## 2019-02-27 DIAGNOSIS — Z79899 Other long term (current) drug therapy: Secondary | ICD-10-CM

## 2019-02-27 HISTORY — PX: VIDEO ASSISTED THORACOSCOPY: SHX5073

## 2019-02-27 HISTORY — PX: SEGMENTECOMY: SHX5076

## 2019-02-27 LAB — POCT I-STAT 7, (LYTES, BLD GAS, ICA,H+H)
Acid-Base Excess: 4 mmol/L — ABNORMAL HIGH (ref 0.0–2.0)
Bicarbonate: 30.4 mmol/L — ABNORMAL HIGH (ref 20.0–28.0)
Calcium, Ion: 1.23 mmol/L (ref 1.15–1.40)
HCT: 35 % — ABNORMAL LOW (ref 36.0–46.0)
Hemoglobin: 11.9 g/dL — ABNORMAL LOW (ref 12.0–15.0)
O2 Saturation: 100 %
Patient temperature: 36
Potassium: 4.1 mmol/L (ref 3.5–5.1)
Sodium: 138 mmol/L (ref 135–145)
TCO2: 32 mmol/L (ref 22–32)
pCO2 arterial: 52.8 mmHg — ABNORMAL HIGH (ref 32.0–48.0)
pH, Arterial: 7.363 (ref 7.350–7.450)
pO2, Arterial: 471 mmHg — ABNORMAL HIGH (ref 83.0–108.0)

## 2019-02-27 SURGERY — VIDEO ASSISTED THORACOSCOPY
Anesthesia: General | Site: Chest | Laterality: Right

## 2019-02-27 MED ORDER — OXYCODONE HCL 5 MG/5ML PO SOLN: 5.0000 mg | Freq: Once | ORAL | Status: DC | PRN

## 2019-02-27 MED ORDER — SALINE SPRAY 0.65 % NA SOLN
1.0000 | NASAL | Status: DC | PRN
  Filled 2019-02-27: qty 44

## 2019-02-27 MED ORDER — BUPIVACAINE HCL (PF) 0.5 % IJ SOLN
INTRAMUSCULAR | Status: DC | PRN
  Administered 2019-02-27: 30 mL

## 2019-02-27 MED ORDER — CEFAZOLIN SODIUM-DEXTROSE 2-4 GM/100ML-% IV SOLN
2.0000 g | INTRAVENOUS | Status: AC
  Administered 2019-02-27: 08:00:00 2 g via INTRAVENOUS

## 2019-02-27 MED ORDER — FENTANYL CITRATE (PF) 100 MCG/2ML IJ SOLN
25.0000 ug | INTRAMUSCULAR | Status: DC | PRN
  Administered 2019-02-27: 12:00:00 25 ug via INTRAVENOUS

## 2019-02-27 MED ORDER — FENTANYL CITRATE (PF) 100 MCG/2ML IJ SOLN
INTRAMUSCULAR | Status: AC
  Filled 2019-02-27: qty 2

## 2019-02-27 MED ORDER — OXYMETAZOLINE HCL 0.05 % NA SOLN
1.0000 | Freq: Two times a day (BID) | NASAL | Status: DC | PRN
  Filled 2019-02-27: qty 30

## 2019-02-27 MED ORDER — SODIUM CHLORIDE 0.9 % IV SOLN
INTRAVENOUS | Status: DC | PRN
  Administered 2019-02-27: 09:00:00 10 ug/min via INTRAVENOUS

## 2019-02-27 MED ORDER — ONDANSETRON HCL 4 MG/2ML IJ SOLN: 4.0000 mg | Freq: Four times a day (QID) | INTRAMUSCULAR | Status: DC | PRN

## 2019-02-27 MED ORDER — NICOTINE 14 MG/24HR TD PT24: 14.0000 mg | MEDICATED_PATCH | Freq: Every day | TRANSDERMAL | Status: DC | PRN

## 2019-02-27 MED ORDER — LACTATED RINGERS IV SOLN
INTRAVENOUS | Status: DC | PRN
  Administered 2019-02-27: 07:00:00 via INTRAVENOUS

## 2019-02-27 MED ORDER — MIDAZOLAM HCL 5 MG/5ML IJ SOLN
INTRAMUSCULAR | Status: DC | PRN
  Administered 2019-02-27: 2 mg via INTRAVENOUS

## 2019-02-27 MED ORDER — SUGAMMADEX SODIUM 200 MG/2ML IV SOLN
INTRAVENOUS | Status: DC | PRN
  Administered 2019-02-27: 150 mg via INTRAVENOUS

## 2019-02-27 MED ORDER — LEVALBUTEROL HCL 0.63 MG/3ML IN NEBU
0.6300 mg | INHALATION_SOLUTION | Freq: Four times a day (QID) | RESPIRATORY_TRACT | Status: DC
  Administered 2019-02-27 – 2019-02-28 (×6): 0.63 mg via RESPIRATORY_TRACT
  Filled 2019-02-27 (×5): qty 3

## 2019-02-27 MED ORDER — SCOPOLAMINE 1 MG/3DAYS TD PT72
MEDICATED_PATCH | TRANSDERMAL | Status: AC
  Filled 2019-02-27: qty 1

## 2019-02-27 MED ORDER — MIDAZOLAM HCL 2 MG/2ML IJ SOLN
INTRAMUSCULAR | Status: AC
  Filled 2019-02-27: qty 2

## 2019-02-27 MED ORDER — SENNOSIDES-DOCUSATE SODIUM 8.6-50 MG PO TABS
1.0000 | ORAL_TABLET | Freq: Every day | ORAL | Status: DC
  Administered 2019-02-28 – 2019-03-07 (×8): 1 via ORAL
  Filled 2019-02-27 (×9): qty 1

## 2019-02-27 MED ORDER — ACETAMINOPHEN 160 MG/5ML PO SOLN
1000.0000 mg | Freq: Four times a day (QID) | ORAL | Status: AC
  Administered 2019-03-04: 1000 mg via ORAL
  Filled 2019-02-27: qty 40.6

## 2019-02-27 MED ORDER — SERTRALINE HCL 100 MG PO TABS
100.0000 mg | ORAL_TABLET | Freq: Every day | ORAL | Status: DC
  Administered 2019-02-27 – 2019-03-07 (×9): 100 mg via ORAL
  Filled 2019-02-27 (×9): qty 1

## 2019-02-27 MED ORDER — OXYCODONE HCL 5 MG PO TABS: 5.0000 mg | ORAL_TABLET | Freq: Once | ORAL | Status: DC | PRN

## 2019-02-27 MED ORDER — ENOXAPARIN SODIUM 40 MG/0.4ML ~~LOC~~ SOLN
40.0000 mg | SUBCUTANEOUS | Status: DC
  Administered 2019-02-27 – 2019-03-11 (×13): 40 mg via SUBCUTANEOUS
  Filled 2019-02-27 (×14): qty 0.4

## 2019-02-27 MED ORDER — BISACODYL 5 MG PO TBEC
10.0000 mg | DELAYED_RELEASE_TABLET | Freq: Every day | ORAL | Status: DC
  Administered 2019-02-27 – 2019-03-08 (×10): 10 mg via ORAL
  Filled 2019-02-27 (×10): qty 2

## 2019-02-27 MED ORDER — PROMETHAZINE HCL 25 MG/ML IJ SOLN: 6.2500 mg | INTRAMUSCULAR | Status: DC | PRN

## 2019-02-27 MED ORDER — NALOXONE HCL 0.4 MG/ML IJ SOLN: 0.4000 mg | INTRAMUSCULAR | Status: DC | PRN

## 2019-02-27 MED ORDER — BUPIVACAINE LIPOSOME 1.3 % IJ SUSP
20.0000 mL | Freq: Once | INTRAMUSCULAR | Status: DC
  Filled 2019-02-27: qty 20

## 2019-02-27 MED ORDER — CEFAZOLIN SODIUM-DEXTROSE 2-4 GM/100ML-% IV SOLN
INTRAVENOUS | Status: AC
  Filled 2019-02-27: qty 100

## 2019-02-27 MED ORDER — ONDANSETRON HCL 4 MG/2ML IJ SOLN
INTRAMUSCULAR | Status: AC
  Filled 2019-02-27: qty 2

## 2019-02-27 MED ORDER — PROPOFOL 10 MG/ML IV BOLUS
INTRAVENOUS | Status: AC
  Filled 2019-02-27: qty 20

## 2019-02-27 MED ORDER — FENTANYL CITRATE (PF) 250 MCG/5ML IJ SOLN
INTRAMUSCULAR | Status: AC
  Filled 2019-02-27: qty 5

## 2019-02-27 MED ORDER — EPHEDRINE SULFATE-NACL 50-0.9 MG/10ML-% IV SOSY
PREFILLED_SYRINGE | INTRAVENOUS | Status: DC | PRN
  Administered 2019-02-27: 2.5 mg via INTRAVENOUS
  Administered 2019-02-27: 5 mg via INTRAVENOUS
  Administered 2019-02-27: 2.5 mg via INTRAVENOUS

## 2019-02-27 MED ORDER — DIPHENHYDRAMINE HCL 12.5 MG/5ML PO ELIX
12.5000 mg | ORAL_SOLUTION | Freq: Four times a day (QID) | ORAL | Status: DC | PRN
  Filled 2019-02-27: qty 5

## 2019-02-27 MED ORDER — SODIUM CHLORIDE 0.9 % IV SOLN
INTRAVENOUS | Status: DC | PRN
  Administered 2019-02-27: 70 mL

## 2019-02-27 MED ORDER — ACETAMINOPHEN 500 MG PO TABS
1000.0000 mg | ORAL_TABLET | Freq: Four times a day (QID) | ORAL | Status: AC
  Administered 2019-02-27 – 2019-03-04 (×15): 1000 mg via ORAL
  Filled 2019-02-27 (×17): qty 2

## 2019-02-27 MED ORDER — 0.9 % SODIUM CHLORIDE (POUR BTL) OPTIME
TOPICAL | Status: DC | PRN
  Administered 2019-02-27: 09:00:00 1000 mL

## 2019-02-27 MED ORDER — EPHEDRINE 5 MG/ML INJ
INTRAVENOUS | Status: AC
  Filled 2019-02-27: qty 10

## 2019-02-27 MED ORDER — PHENYLEPHRINE HCL-NACL 10-0.9 MG/250ML-% IV SOLN
INTRAVENOUS | Status: AC
  Filled 2019-02-27: qty 500

## 2019-02-27 MED ORDER — LIDOCAINE 2% (20 MG/ML) 5 ML SYRINGE
INTRAMUSCULAR | Status: DC | PRN
  Administered 2019-02-27: 100 mg via INTRAVENOUS

## 2019-02-27 MED ORDER — SCOPOLAMINE 1 MG/3DAYS TD PT72
MEDICATED_PATCH | TRANSDERMAL | Status: DC | PRN
  Administered 2019-02-27: 1 via TRANSDERMAL

## 2019-02-27 MED ORDER — SODIUM CHLORIDE 0.9% FLUSH: 9.0000 mL | INTRAVENOUS | Status: DC | PRN

## 2019-02-27 MED ORDER — SUCCINYLCHOLINE CHLORIDE 200 MG/10ML IV SOSY
PREFILLED_SYRINGE | INTRAVENOUS | Status: AC
  Filled 2019-02-27: qty 10

## 2019-02-27 MED ORDER — DEXAMETHASONE SODIUM PHOSPHATE 10 MG/ML IJ SOLN
INTRAMUSCULAR | Status: AC
  Filled 2019-02-27: qty 1

## 2019-02-27 MED ORDER — DEXAMETHASONE SODIUM PHOSPHATE 10 MG/ML IJ SOLN
INTRAMUSCULAR | Status: DC | PRN
  Administered 2019-02-27: 10 mg via INTRAVENOUS

## 2019-02-27 MED ORDER — BUPIVACAINE HCL (PF) 0.5 % IJ SOLN
INTRAMUSCULAR | Status: AC
  Filled 2019-02-27: qty 30

## 2019-02-27 MED ORDER — ONDANSETRON HCL 4 MG/2ML IJ SOLN
INTRAMUSCULAR | Status: DC | PRN
  Administered 2019-02-27: 4 mg via INTRAVENOUS

## 2019-02-27 MED ORDER — OXYCODONE HCL 5 MG PO TABS
5.0000 mg | ORAL_TABLET | ORAL | Status: DC | PRN
  Administered 2019-02-28 – 2019-03-08 (×26): 10 mg via ORAL
  Administered 2019-03-08: 5 mg via ORAL
  Administered 2019-03-09 – 2019-03-10 (×4): 10 mg via ORAL
  Administered 2019-03-11: 5 mg via ORAL
  Administered 2019-03-11: 10 mg via ORAL
  Filled 2019-02-27: qty 2
  Filled 2019-02-27: qty 1
  Filled 2019-02-27 (×22): qty 2
  Filled 2019-02-27: qty 1
  Filled 2019-02-27 (×7): qty 2
  Filled 2019-02-27: qty 1
  Filled 2019-02-27: qty 2

## 2019-02-27 MED ORDER — ROCURONIUM BROMIDE 10 MG/ML (PF) SYRINGE
PREFILLED_SYRINGE | INTRAVENOUS | Status: DC | PRN
  Administered 2019-02-27: 50 mg via INTRAVENOUS
  Administered 2019-02-27: 20 mg via INTRAVENOUS
  Administered 2019-02-27: 10 mg via INTRAVENOUS

## 2019-02-27 MED ORDER — DICYCLOMINE HCL 20 MG PO TABS: 20.0000 mg | ORAL_TABLET | Freq: Four times a day (QID) | ORAL | Status: DC | PRN

## 2019-02-27 MED ORDER — SUCCINYLCHOLINE CHLORIDE 200 MG/10ML IV SOSY
PREFILLED_SYRINGE | INTRAVENOUS | Status: DC | PRN
  Administered 2019-02-27: 100 mg via INTRAVENOUS

## 2019-02-27 MED ORDER — ORAL CARE MOUTH RINSE
15.0000 mL | Freq: Two times a day (BID) | OROMUCOSAL | Status: DC
  Administered 2019-02-27 – 2019-03-11 (×19): 15 mL via OROMUCOSAL

## 2019-02-27 MED ORDER — LIDOCAINE 2% (20 MG/ML) 5 ML SYRINGE
INTRAMUSCULAR | Status: AC
  Filled 2019-02-27: qty 5

## 2019-02-27 MED ORDER — PROPOFOL 10 MG/ML IV BOLUS
INTRAVENOUS | Status: DC | PRN
  Administered 2019-02-27: 100 mg via INTRAVENOUS

## 2019-02-27 MED ORDER — ROCURONIUM BROMIDE 10 MG/ML (PF) SYRINGE
PREFILLED_SYRINGE | INTRAVENOUS | Status: AC
  Filled 2019-02-27: qty 10

## 2019-02-27 MED ORDER — KETOROLAC TROMETHAMINE 15 MG/ML IJ SOLN: 15.0000 mg | Freq: Four times a day (QID) | INTRAMUSCULAR | Status: AC | PRN

## 2019-02-27 MED ORDER — PHENYLEPHRINE 40 MCG/ML (10ML) SYRINGE FOR IV PUSH (FOR BLOOD PRESSURE SUPPORT)
PREFILLED_SYRINGE | INTRAVENOUS | Status: AC
  Filled 2019-02-27: qty 10

## 2019-02-27 MED ORDER — DEXTROSE-NACL 5-0.9 % IV SOLN
INTRAVENOUS | Status: DC
  Administered 2019-02-27: 15:00:00 100 mL/h via INTRAVENOUS
  Administered 2019-02-28: 03:00:00 via INTRAVENOUS

## 2019-02-27 MED ORDER — TRAMADOL HCL 50 MG PO TABS
50.0000 mg | ORAL_TABLET | Freq: Four times a day (QID) | ORAL | Status: DC | PRN
  Administered 2019-03-08: 50 mg via ORAL
  Filled 2019-02-27 (×3): qty 2

## 2019-02-27 MED ORDER — POTASSIUM CHLORIDE 10 MEQ/50ML IV SOLN: 10.0000 meq | Freq: Every day | INTRAVENOUS | Status: DC | PRN

## 2019-02-27 MED ORDER — METOCLOPRAMIDE HCL 5 MG/ML IJ SOLN
10.0000 mg | Freq: Four times a day (QID) | INTRAMUSCULAR | Status: AC
  Administered 2019-02-27 – 2019-02-28 (×3): 10 mg via INTRAVENOUS
  Filled 2019-02-27 (×3): qty 2

## 2019-02-27 MED ORDER — FENTANYL CITRATE (PF) 250 MCG/5ML IJ SOLN
INTRAMUSCULAR | Status: DC | PRN
  Administered 2019-02-27 (×3): 50 ug via INTRAVENOUS

## 2019-02-27 MED ORDER — CEFAZOLIN SODIUM-DEXTROSE 2-4 GM/100ML-% IV SOLN
2.0000 g | Freq: Three times a day (TID) | INTRAVENOUS | Status: AC
  Administered 2019-02-27 (×2): 2 g via INTRAVENOUS
  Filled 2019-02-27 (×2): qty 100

## 2019-02-27 MED ORDER — DIPHENHYDRAMINE HCL 50 MG/ML IJ SOLN: 12.5000 mg | Freq: Four times a day (QID) | INTRAMUSCULAR | Status: DC | PRN

## 2019-02-27 MED ORDER — MORPHINE SULFATE 2 MG/ML IV SOLN
INTRAVENOUS | Status: DC
  Administered 2019-02-27: 1.5 mg via INTRAVENOUS
  Administered 2019-02-27: 0 mg via INTRAVENOUS
  Administered 2019-02-27: 12:00:00 via INTRAVENOUS
  Administered 2019-02-28: 3 mg via INTRAVENOUS
  Administered 2019-02-28: 18:00:00 via INTRAVENOUS
  Administered 2019-02-28: 3 mg via INTRAVENOUS
  Administered 2019-02-28: 4.5 mg via INTRAVENOUS
  Administered 2019-02-28: 13.5 mg via INTRAVENOUS
  Administered 2019-03-01: 2 mL via INTRAVENOUS
  Administered 2019-03-01: 1.5 mL via INTRAVENOUS
  Filled 2019-02-27: qty 50
  Filled 2019-02-27: qty 30
  Filled 2019-02-27: qty 50

## 2019-02-27 SURGICAL SUPPLY — 82 items
APPLICATOR COTTON TIP 6 STRL (MISCELLANEOUS) IMPLANT
APPLICATOR COTTON TIP 6IN STRL (MISCELLANEOUS)
APPLIER CLIP ROT 10 11.4 M/L (STAPLE)
CANISTER SUCT 3000ML PPV (MISCELLANEOUS) ×3 IMPLANT
CATH THORACIC 28FR (CATHETERS) ×3 IMPLANT
CATH THORACIC 28FR RT ANG (CATHETERS) IMPLANT
CATH THORACIC 36FR (CATHETERS) IMPLANT
CATH THORACIC 36FR RT ANG (CATHETERS) IMPLANT
CLIP APPLIE ROT 10 11.4 M/L (STAPLE) IMPLANT
CLIP VESOCCLUDE MED 6/CT (CLIP) IMPLANT
CONN ST 1/4X3/8  BEN (MISCELLANEOUS) ×2
CONN ST 1/4X3/8 BEN (MISCELLANEOUS) ×1 IMPLANT
CONN Y 3/8X3/8X3/8  BEN (MISCELLANEOUS) ×2
CONN Y 3/8X3/8X3/8 BEN (MISCELLANEOUS) ×1 IMPLANT
CONT SPEC 4OZ CLIKSEAL STRL BL (MISCELLANEOUS) ×42 IMPLANT
COVER SURGICAL LIGHT HANDLE (MISCELLANEOUS) ×3 IMPLANT
COVER WAND RF STERILE (DRAPES) IMPLANT
DERMABOND ADVANCED (GAUZE/BANDAGES/DRESSINGS) ×2
DERMABOND ADVANCED .7 DNX12 (GAUZE/BANDAGES/DRESSINGS) ×1 IMPLANT
DRAIN CHANNEL 28F RND 3/8 FF (WOUND CARE) IMPLANT
DRAIN CHANNEL 32F RND 10.7 FF (WOUND CARE) IMPLANT
DRAPE LAPAROSCOPIC ABDOMINAL (DRAPES) ×3 IMPLANT
DRAPE SLUSH/WARMER DISC (DRAPES) ×3 IMPLANT
ELECT BLADE 6.5 EXT (BLADE) ×3 IMPLANT
ELECT REM PT RETURN 9FT ADLT (ELECTROSURGICAL) ×3
ELECTRODE REM PT RTRN 9FT ADLT (ELECTROSURGICAL) ×1 IMPLANT
GAUZE SPONGE 4X4 12PLY STRL (GAUZE/BANDAGES/DRESSINGS) ×3 IMPLANT
GLOVE SURG SIGNA 7.5 PF LTX (GLOVE) ×6 IMPLANT
GOWN STRL REUS W/ TWL LRG LVL3 (GOWN DISPOSABLE) ×2 IMPLANT
GOWN STRL REUS W/ TWL XL LVL3 (GOWN DISPOSABLE) ×1 IMPLANT
GOWN STRL REUS W/TWL LRG LVL3 (GOWN DISPOSABLE) ×4
GOWN STRL REUS W/TWL XL LVL3 (GOWN DISPOSABLE) ×2
HEMOSTAT SURGICEL 2X14 (HEMOSTASIS) IMPLANT
IV CATH 22GX1 FEP (IV SOLUTION) IMPLANT
KIT BASIN OR (CUSTOM PROCEDURE TRAY) ×3 IMPLANT
KIT SUCTION CATH 14FR (SUCTIONS) ×3 IMPLANT
KIT TURNOVER KIT B (KITS) ×3 IMPLANT
NEEDLE SPNL 18GX3.5 QUINCKE PK (NEEDLE) ×3 IMPLANT
NS IRRIG 1000ML POUR BTL (IV SOLUTION) ×6 IMPLANT
PACK CHEST (CUSTOM PROCEDURE TRAY) ×3 IMPLANT
PAD ARMBOARD 7.5X6 YLW CONV (MISCELLANEOUS) ×6 IMPLANT
POUCH ENDO CATCH II 15MM (MISCELLANEOUS) ×6 IMPLANT
POUCH SPECIMEN RETRIEVAL 10MM (ENDOMECHANICALS) IMPLANT
RELOAD STAPLER GOLD 60MM (STAPLE) ×5 IMPLANT
RELOAD STAPLER GREEN 60MM (STAPLE) ×5 IMPLANT
SEALANT PROGEL (MISCELLANEOUS) IMPLANT
SEALANT SURG COSEAL 4ML (VASCULAR PRODUCTS) IMPLANT
SEALANT SURG COSEAL 8ML (VASCULAR PRODUCTS) IMPLANT
SHEARS HARMONIC HDI 20CM (ELECTROSURGICAL) ×3 IMPLANT
SOLUTION ANTI FOG 6CC (MISCELLANEOUS) ×3 IMPLANT
SPECIMEN JAR MEDIUM (MISCELLANEOUS) ×3 IMPLANT
SPONGE INTESTINAL PEANUT (DISPOSABLE) ×3 IMPLANT
SPONGE TONSIL TAPE 1 RFD (DISPOSABLE) ×3 IMPLANT
STAPLE ECHEON FLEX 60 POW ENDO (STAPLE) ×3 IMPLANT
STAPLE RELOAD 2.5MM WHITE (STAPLE) ×9 IMPLANT
STAPLER RELOAD GOLD 60MM (STAPLE) ×15
STAPLER RELOAD GREEN 60MM (STAPLE) ×15
STAPLER VASCULAR ECHELON 35 (CUTTER) ×3 IMPLANT
SUT PROLENE 4 0 RB 1 (SUTURE)
SUT PROLENE 4-0 RB1 .5 CRCL 36 (SUTURE) IMPLANT
SUT SILK  1 MH (SUTURE) ×4
SUT SILK 1 MH (SUTURE) ×2 IMPLANT
SUT SILK 2 0SH CR/8 30 (SUTURE) IMPLANT
SUT SILK 3 0SH CR/8 30 (SUTURE) IMPLANT
SUT VIC AB 1 CTX 36 (SUTURE)
SUT VIC AB 1 CTX36XBRD ANBCTR (SUTURE) IMPLANT
SUT VIC AB 2-0 CTX 36 (SUTURE) IMPLANT
SUT VIC AB 2-0 UR6 27 (SUTURE) IMPLANT
SUT VIC AB 3-0 MH 27 (SUTURE) IMPLANT
SUT VIC AB 3-0 X1 27 (SUTURE) ×3 IMPLANT
SUT VICRYL 2 TP 1 (SUTURE) IMPLANT
SYR 20CC LL (SYRINGE) ×3 IMPLANT
SYSTEM SAHARA CHEST DRAIN ATS (WOUND CARE) ×3 IMPLANT
TAPE CLOTH 4X10 WHT NS (GAUZE/BANDAGES/DRESSINGS) ×3 IMPLANT
TAPE CLOTH SURG 4X10 WHT LF (GAUZE/BANDAGES/DRESSINGS) ×3 IMPLANT
TIP APPLICATOR SPRAY EXTEND 16 (VASCULAR PRODUCTS) IMPLANT
TOWEL GREEN STERILE (TOWEL DISPOSABLE) ×3 IMPLANT
TOWEL GREEN STERILE FF (TOWEL DISPOSABLE) ×3 IMPLANT
TRAY FOLEY MTR SLVR 16FR STAT (SET/KITS/TRAYS/PACK) ×3 IMPLANT
TROCAR XCEL BLADELESS 5X75MML (TROCAR) ×3 IMPLANT
TROCAR XCEL NON-BLD 5MMX100MML (ENDOMECHANICALS) IMPLANT
WATER STERILE IRR 1000ML POUR (IV SOLUTION) ×6 IMPLANT

## 2019-02-27 NOTE — Brief Op Note (Addendum)
02/27/2019  10:42 AM  PATIENT:  Madeline Gates  63 y.o. 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), clinical stage IIIA (T4N0)  PROCEDURE:  Procedure(s):  VIDEO ASSISTED THORACOSCOPY (Right) -Wedge Resection Right Upper Lobe -Segmentectomy Right Lower Lobe LYMPH NODE DISSECTION (Right) INTERCOSTAL NERVE BLOCK  SURGEON:  Surgeon(s) and Role:    * [name], MD - Primary  PHYSICIAN ASSISTANT: Ellwood Handler PA-C  ANESTHESIA:   general  EBL:  45 mL   BLOOD ADMINISTERED:none  DRAINS: 28 Straight Chest Tube   LOCAL MEDICATIONS USED:  BUPIVICAINE   SPECIMEN:  Source of Specimen:  Wege Resection Right Upper Lobe, Segement of Right Lower Lobe, Lymph Nodes  DISPOSITION OF SPECIMEN:  PATHOLOGY  COUNTS:  YES  TOURNIQUET:  * No tourniquets in log *  DICTATION: .Dragon Dictation  PLAN OF CARE: Admit to inpatient   PATIENT DISPOSITION:  PACU - hemodynamically stable.   Delay start of Pharmacological VTE agent (>24hrs) due to surgical blood loss or risk of bleeding: no

## 2019-02-27 NOTE — Transfer of Care (Signed)
Immediate Anesthesia Transfer of Care Note  Patient: Madeline Gates  Procedure(s) Performed: VIDEO ASSISTED THORACOSCOPY (Right Chest) RIGHT UPPER WEDGE AND RIGHT LOWER SEGMENTECTOMY LYMPH NODE DISSECTION (Right Chest)  Patient Location: PACU  Anesthesia Type:General  Level of Consciousness: drowsy, patient cooperative and responds to stimulation  Airway & Oxygen Therapy: Patient Spontanous Breathing and Patient connected to face mask oxygen  Post-op Assessment: Report given to RN and Post -op Vital signs reviewed and stable  Post vital signs: Reviewed and stable  Last Vitals:  Vitals Value Taken Time  BP 103/54 02/27/2019 11:12 AM  Temp    Pulse 78 02/27/2019 11:14 AM  Resp 21 02/27/2019 11:14 AM  SpO2 100 % 02/27/2019 11:14 AM  Vitals shown include unvalidated device data.  Last Pain:  Vitals:   02/27/19 0636  TempSrc:   PainSc: 0-No pain      Patients Stated Pain Goal: 2 (52/48/18 5909)  Complications: No apparent anesthesia complications

## 2019-02-27 NOTE — Interval H&P Note (Signed)
History and Physical Interval Note:  02/27/2019 7:43 AM  Madeline Gates  has presented today for surgery, with the diagnosis of RUL & RLL NODULES.  The various methods of treatment have been discussed with the patient and family. After consideration of risks, benefits and other options for treatment, the patient has consented to  Procedure(s): VIDEO ASSISTED THORACOSCOPY (Right) RIGHT UPPER AND RIGHT LOWER SEGMENTECTOMIES (Right) as a surgical intervention.  The patient's history has been reviewed, patient examined, no change in status, stable for surgery.  I have reviewed the patient's chart and labs.  Questions were answered to the patient's satisfaction.     [name]

## 2019-02-27 NOTE — Op Note (Signed)
NAME: Madeline, Gates MEDICAL RECORD TI:[id number] ACCOUNT [id number] DATE OF BIRTH:1956/04/03 FACILITY: [facility] LOCATION: [facility] PHYSICIAN:Lauris Serviss C. Davinder Haff, MD  OPERATIVE REPORT  DATE OF PROCEDURE:  02/27/2019  PREOPERATIVE DIAGNOSIS:  Synchronous right upper lobe and lower lobe lung nodules, suspected stage IIIA (T4, N0) nonsmall-cell lung cancer.  POSTOPERATIVE DIAGNOSIS:  Nonsmall-cell carcinoma, right upper and lower lobes, synchronous T4 N0, stage IIIA nonsmall-cell carcinoma.  PROCEDURE:  Right video-assisted thoracoscopy, Right lower lobe medial and posterior basilar segmentectomy, Wedge resection, right upper lobe nodule, Lymph node dissection, and Intercostal nerve block from levels 3 through 9.  SURGEON:  Modesto Charon, MD  ASSISTANT:  Ellwood Handler, PA  ANESTHESIA:  General.  FINDINGS:  No abnormality of the parietal pleura or pleural effusion.  Frozen section of both nodules revealed nonsmall-cell carcinoma with clear margins, benign-appearing lymph nodes.  CLINICAL NOTE:  The patient is a 63 year old woman with a history of tobacco abuse who initially presented with abdominal cramps and diarrhea.  CT of the abdomen showed a right lower lobe lung nodule.  A CT of the chest showed a 3.6 x 3.3 cm spiculated  mass in the right apex and a 3.1 x 2.3 cm spiculated mass in the right lower lobe.  The findings were consistent with synchronous lung cancers.  On PET CT, both of these nodules were hypermetabolic.  There was no evidence of regional or distant  metastatic disease.  She was offered surgical resection versus chemoradiation.  She strongly wished to proceed with surgery.  She understood there was no guarantee of a cure.  The indications, risks, benefits, and alternatives were discussed in detail with the patient.  She accepted the risks and agreed to proceed.  OPERATIVE NOTE:  Madeline Gates was brought to the preoperative holding area on 02/27/2019.  Anesthesia  placed a central venous line and an arterial blood pressure monitor line.  She was taken to the operating room, anesthetized and intubated.  A Foley  catheter was placed.  Intravenous antibiotics were administered.  Sequential compression devices were placed on the calves for DVT prophylaxis.  She was placed in a left lateral decubitus position, and the right chest was prepped and draped in the usual  sterile fashion.  Single-lung ventilation of the left lung was initiated and was tolerated well throughout the procedure.  A timeout was performed.  A solution containing 20 mL of liposomal bupivacaine, 30 mL of 0.5% bupivacaine, and 50 mL of saline was prepared.  This was used for local at the incision sites, as well as for the intercostal nerve blocks.  The incision sites  were injected prior to making the incision.  An incision was made in the 7th interspace in the midaxillary line.  A 5 mm port was inserted into the chest.  A thoracoscope was advanced into the chest.  There was good isolation of the right lung, although it was relatively slow to deflate, it did  deflate nicely when suction was applied.  There was no pleural effusion and no abnormality of the parietal pleura.  A 5 cm working incision was made in the 4th interspace anterolaterally.  No rib spreading was performed during the procedure.  Intercostal  nerve blocks were performed from the 3rd through the 9th interspaces.  The needle was passed from a posterior approach, and 10 mL of the bupivacaine solution was injected into each interspace in a subpleural plane from the 3rd to the 9th.  A Harmonic scalpel was used to divide the inferior pulmonary ligament.  All lymph nodes that were encountered during the dissection were removed and sent as separate specimens for permanent pathology.  The pleural reflection then was divided at the hilum  posteriorly, and level 7 nodes were removed as well.  The pleural reflection was divided at the hilum  anteriorly, remaining well away from the phrenic nerve and finally superiorly as well.  There was 1 minor adhesion at the apex which tore when the apex  was retracted.  The Harmonic scalpel was used to achieve hemostasis on the small blood vessel in the chest wall.  The fissures were relatively complete.  The pleural reflection overlying the fissure and the major fissure between the lower and middle lobes  was divided, and the basilar segmental pulmonary artery was identified.  The medial and posterior branches were dissected out, encircled, and divided separately using a vascular stapler.  The bronchus to these segments then was likewise encircled and  divided.  An Echelon stapler with a green cartridge was placed across the segmental bronchus.  A test inflation showed good aeration of the remainder of the lower lobe.  The stapler was fired, transecting the bronchus to these segment.  The vein draining  these segments then was divided with the vascular stapler, and a segmentectomy was completed by dividing the parenchyma along the line of aerated and nonaerated lung.  There was a good gross margin on the tumor.  The parenchymal division was done with a  combination of green and gold staple cartridges using the Echelon 60 mm stapler.  The specimen was placed into an endoscopic retrieval bag, removed and sent for frozen section.  While awaiting those results, the upper lobe was inspected.  The nodule was  very apical, and it was felt that a good gross margin could be attained with a large wedge resection, again using both gold and green cartridges on the Echelon stapler.  Approximately half of the upper lobe was removed. The  specimen was placed into an endoscopic retrieval bag and removed.  The closest margin was marked, and this was sent for frozen section as well.  While awaiting those results, the hilar (10R) and paratracheal (4R) nodes were dissected out and sent for  pathology.  Frozen sections of the  masses showed nonsmall-cell carcinoma.  The bronchial margin was negative on the lower lobe, and the stapled margin was negative on the upper lobe.  The chest was copiously irrigated with warm saline.  A test inflation at 30 cm of  water revealed minimal air leak from the staple line.  There was no leak from the bronchial stump.  A 28-French chest tube was placed through the original port incision and secured with a #1 silk suture.  Dual-lung ventilation was resumed.  The working incision was closed in 3 layers in standard fashion.  Dermabond was applied.  The chest tube was placed to suction.  The patient was placed back in supine position.  She was extubated in  the operating room and taken to the postanesthetic care unit in good condition.  All sponge, needle and instrument counts were correct at the end of the procedure.  LN/NUANCE  D:02/27/2019 T:02/27/2019 JOB:[id number]

## 2019-02-27 NOTE — Progress Notes (Signed)
Patient BP 83/50, A-line 82/41. Called and spoke with Ellwood Handler, PA to update on patients BP, Patient is asymptamatic, Patient is on Morphine PCA,Chest tube output at 30 cc.  no new orders at this time, will continue to monitor patient.

## 2019-02-27 NOTE — Anesthesia Procedure Notes (Signed)
Procedure Name: Intubation Date/Time: 02/27/2019 8:04 AM Performed by: Jearld Pies, CRNA Pre-anesthesia Checklist: Patient identified, Emergency Drugs available, Suction available and Patient being monitored Patient Re-evaluated:Patient Re-evaluated prior to induction Oxygen Delivery Method: Circle System Utilized Preoxygenation: Pre-oxygenation with 100% oxygen Induction Type: IV induction Laryngoscope Size: Mac and 3 Grade View: Grade I Tube type: Oral Endobronchial tube: Left, EBT position confirmed by auscultation, Double lumen EBT and EBT position confirmed by fiberoptic bronchoscope and 35 Fr Number of attempts: 1 Airway Equipment and Method: Stylet Placement Confirmation: ETT inserted through vocal cords under direct vision,  positive ETCO2 and breath sounds checked- equal and bilateral Secured at: 28 cm Tube secured with: Tape Dental Injury: Teeth and Oropharynx as per pre-operative assessment

## 2019-02-27 NOTE — Anesthesia Postprocedure Evaluation (Signed)
Anesthesia Post Note  Patient: Madeline Gates  Procedure(s) Performed: VIDEO ASSISTED THORACOSCOPY (Right Chest) RIGHT UPPER WEDGE AND RIGHT LOWER SEGMENTECTOMY LYMPH NODE DISSECTION (Right Chest)     Patient location during evaluation: PACU Anesthesia Type: General Level of consciousness: awake and alert Pain management: pain level controlled Vital Signs Assessment: post-procedure vital signs reviewed and stable Respiratory status: spontaneous breathing, nonlabored ventilation, respiratory function stable and patient connected to nasal cannula oxygen Cardiovascular status: blood pressure returned to baseline and stable Postop Assessment: no apparent nausea or vomiting Anesthetic complications: no    Last Vitals:  Vitals:   02/27/19 1205 02/27/19 1215  BP: (!) 91/49 (!) 96/55  Pulse: 77 77  Resp: 18 18  Temp:    SpO2: 94% 100%                  [name]

## 2019-02-27 NOTE — Discharge Summary (Addendum)
Physician Discharge Summary  Patient ID: Madeline Gates MRN: [id number] DOB/AGE: 1956/08/17 63 y.o.  Admit date: 02/27/2019 Discharge date: 03/12/2019  Admission Diagnoses:  Patient Active Problem List   Diagnosis Date Noted  . Diarrhea 02/03/2019  . Right lower lobe lung mass 01/30/2019  . Mass of upper lobe of right lung 01/30/2019  . Anxiety, generalized 01/27/2019  . Nicotine dependence, cigarettes, uncomplicated 23/55/7322  . Post menopausal syndrome 07/30/2018  . Back pain 08/07/2016  . Knee effusion, left 07/12/2015  . Synovial cyst of left knee 07/12/2015  . Gynecologic exam normal 03/16/2015  . Need for Tdap vaccination 03/16/2015  . Recurrent major depressive disorder, in full remission ([place]) 03/16/2015  . Major depressive disorder, single episode, mild ([place]) 06/16/2014    Discharge Diagnoses:   Patient Active Problem List   Diagnosis Date Noted  . S/P wedge Resection Right Upper Lobe of Lung, Segmentectomy Right Lower Lobe Lung,  02/27/2019  . Diarrhea 02/03/2019  . Right lower lobe lung mass 01/30/2019  . Mass of upper lobe of right lung 01/30/2019  . Anxiety, generalized 01/27/2019  . Nicotine dependence, cigarettes, uncomplicated 02/54/2706  . Post menopausal syndrome 07/30/2018  . Back pain 08/07/2016  . Knee effusion, left 07/12/2015  . Synovial cyst of left knee 07/12/2015  . Gynecologic exam normal 03/16/2015  . Need for Tdap vaccination 03/16/2015  . Recurrent major depressive disorder, in full remission ([place]) 03/16/2015  . Major depressive disorder, single episode, mild ([place]) 06/16/2014   Discharged Condition: good  History of Present Illness:  [name] is a 63 yo white female with known history of nicotine abuse for 40 years.  She developed issues with abdominal cramps and diarrhea.  Workup for this consisted of CT of the abdomen, which incidentally revealed a right lower lube lung nodule.  CT of the chest was therefore obtained and confirmed the  lesion of the right lower lobe measuring 3.1 x 2.3 cm described as cavitary and spiculated.  However, she was also found to have a 3.6 x 3.3 cm spiculated mass in the right apex.  There was no evidence of adenopathy.  She underwent PET/CT scan which showed both lesions to be hypermetabolic without evidence of distant or regional metastatic.  MRI of the brain was obtained and ruled out evidence of metastatic disease.  Due to these findings she was referred to Dr. Roxan Hockey for evaluation for surgical resection.  During that visit the patient admitted to being fairly active.  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  She denies chest pain, pressure or tightness.  She does get short of breath if she walks up an incline, but is able to walk over a mile on flat ground.  She denies cough, hemoptysis, wheezing, or neurologic changes.  She has noticed she lost 10 lbs over the past 3 months.  The patient has recently stopped smoking, 2 weeks ago.  It was recommended she undergo VATS procedure with resection of both masses.  If margins were not clear she would possibly require pneumonectomy.  The risks and benefits of the procedure were explained to the patient and she was agreeable to proceed.  Hospital Course:   [name] presented to [facility] on 02/27/2019.  She was taken to the operating room and underwent Right VATS with wedge resection of right upper lobectomy, segmentectomy of right lower lobe, lymph node sampling, and intercostal nerve block.  She tolerated the procedure without difficulty, was extubated and taken to the PACU in stable condition.  The patient developed hypotension.  She was asymptomatic.  This was felt to be related to Morphine use from PCA.  Her chest tube exhibited an air leak and was kept on suction.  CXR remained stable.  She was transitioned to water seal on 03/01/2019.  The patient developed sub q emphysema.  CXR developed an apical pneumothorax.  She was placed back on  suction.  Her CXR remained stable with right apical space.  Sub q air remained stable.  Her leak improved and she was transitioned to water seal on 03/05/2019.  She developed an air leak.  Her chest xray initially remained stable.  However, she again developed an increase in her pneumothorax on the right and an increase in her sub q air along her right chest. Her chest tube was again transitioned to suction on 03/08/2019.  Her sub q air again improved.  Her pneumothorax remained stable.  She was again placed on water seal on 03/10/2019.  She again continued to have an air leak.  Her chest tube was converted to a Mini Express pleurovac.  Her follow up CXR showed interval decrease with perhaps resolution of the right pneumothorax and trace residual right pleural effusion. She continued to have an air leak and will be discharged home with chest tube in place.    She developed rapid Atrial Fibrillation.  She was treated with IV Amiodarone with successful conversion to NSR.  She was transitioned to oral Amiodarone. She developed a RUE phlebitis/cellulitis due to IV Amiodarone infusion.  She was treated with Keflex and warm compresses for this.  She also developed oral thrush and was treated with magic mouthwash with improvement of symptoms.  She is ambulating without difficulty.  She is tolerating a diet.  Her incision is healing without evidence of infection.  Because her SBP has been in the low 100's and HR mid 60's, she has not been receiving Lopressor 12.5 mg bid. As a result, I will stop the Lopressor and decrease Amidoarone to 200 mg daily. She is medically stable for discharge home today with chest tube to mini express and with HH.  Significant Diagnostic Studies: nuclear medicine:   1. Hypermetabolic right upper and right lower lobe lung masses, consistent with synchronous primary bronchogenic carcinomas. 2. No evidence of hypermetabolic thoracic nodal or extrathoracic metastasis. 3.  Aortic Atherosclerosis  (ICD10-I70.0).  Emphysema (ICD10-J43.9). 4. Small volume nonspecific pelvic fluid, new since 01/03/2019.  CLINICAL DATA:  Pneumothorax with chest tube.  EXAM: PORTABLE CHEST 1 VIEW  COMPARISON:  03/11/2019  FINDINGS: 0621 hours. Right chest tube remains in place. Right apically pleural line seen on the previous study is not clearly visualized today suggesting interval decrease in volume of pleural gas. Staple line noted right upper lobe with some atelectasis in the right base and probably trace right pleural effusion. Left lung hyper expanded but clear. The cardiopericardial silhouette is within normal limits for size. The visualized bony structures of the thorax are intact. Telemetry leads overlie the chest.  IMPRESSION: Interval decrease with perhaps resolution of the right pneumothorax.  Trace residual right pleural effusion.   Electronically Signed   By: Misty Stanley M.D.   On: 03/12/2019 08:21  PATHOLOGY:  1. Lung, resection (segmental or lobe), right lower lobe - INVASIVE ADENOCARCINOMA, 3.3 CM. - VISCERAL PLEURA INVASION IS PRESENT. - MARGINS OF RESECTION ARE NOT INVOLVED. - SEE ONCOLOGY TABLE 2. Lung, wedge biopsy/resection, right upper lobe - INVASIVE ADENOCARCINOMA, 2.7 CM. - NO VISCERAL PLEURA INVASION IDENTIFIED. - MARGINS  OF RESECTION ARE NOT INVOLVED. - SEE ONCOLOGY TABLE. 3. Lymph node, biopsy, level 9 - ONE LYMPH NODE, NEGATIVE FOR CARCINOMA (0/1). 4. Lymph node, biopsy, level 12 - ONE LYMPH NODE, NEGATIVE FOR CARCINOMA (0/1). 5. Lymph node, biopsy, level 12 #2 - ONE LYMPH NODE, NEGATIVE FOR CARCINOMA (0/1). 6. Lymph node, biopsy, level 12 #3 - ONE LYMPH NODE, NEGATIVE FOR CARCINOMA (0/1). 7. Lymph node, biopsy, level 11 - ONE LYMPH NODE, NEGATIVE FOR CARCINOMA (0/1). 8. Lymph node, biopsy, level 11 #2 - ONE LYMPH NODE, NEGATIVE FOR CARCINOMA (0/1). 9. Lymph node, biopsy, level 13 - ONE LYMPH NODE, NEGATIVE FOR CARCINOMA (0/1). 10. Lymph  node, biopsy, level 10 - ONE LYMPH NODE, NEGATIVE FOR CARCINOMA (0/1). 11. Lymph node, biopsy, level 4R - ONE LYMPH NODE, NEGATIVE FOR CARCINOMA (0/1). 12. Lymph node, biopsy, level 7 1 of 4 FINAL for Tesler, Tristine D ([id number]) Diagnosis(continued) - ONE LYMPH NODE, NEGATIVE FOR CARCINOMA (0/1). 13. Lymph node, biopsy, level 7 #2 - ONE LYMPH NODE, NEGATIVE FOR CARCINOMA (0/1). 14. Lung, biopsy, final margin right lower lobe - BRONCHIAL MARGIN, CLINICALLY FINAL OF RIGHT LOWER LOBE, NEGATIVE FOR CARCINOMA.  Treatments: surgery:   Right video-assisted thoracoscopy, right lower lobe medial and posterior basilar segmentectomy, wedge resection, right upper lobe nodule, lymph node dissection, and intercostal nerve block from levels 3 through 9.  Discharge Exam: Blood pressure 109/61, pulse 65, temperature 98.1 F (36.7 C), temperature source Oral, resp. rate 15, height 5\' 5"  (1.651 m), weight 48.7 kg, SpO2 99 %. Cardiovascular: RRR Pulmonary: Coarse on the right and clear on the left Abdomen: Soft, non tender, bowel sounds present. Extremities: No lower extremity edema. Wounds: Clean and dry.  No erythema or signs of infection. Chest Tube: to mini express, air leak   Disposition:  Stable for discharge home  Discharge medications:   Allergies as of 03/12/2019      Reactions   Demerol [meperidine Hcl] Swelling   Swelling around IV site   Ibuprofen Diarrhea, Nausea And Vomiting      Medication List    TAKE these medications   acetaminophen 500 MG tablet Commonly known as:  TYLENOL Take 500 mg by mouth 2 (two) times daily as needed for headache.   amiodarone 200 MG tablet Commonly known as:  PACERONE Take 1 tablet (200 mg total) by mouth daily.   Calcium 600+D3 600-800 MG-UNIT Tabs Generic drug:  Calcium Carb-Cholecalciferol Take 1 tablet by mouth daily.   dicyclomine 20 MG tablet Commonly known as:  BENTYL Take 20 mg by mouth every 6 (six) hours as needed (colitis  flare).   doxylamine (Sleep) 25 MG tablet Commonly known as:  UNISOM Take 12.5 mg by mouth at bedtime as needed for sleep.   multivitamin with minerals Tabs tablet Take 1 tablet by mouth 2 (two) times a week.   nicotine 14 mg/24hr patch Commonly known as:  NICODERM CQ - dosed in mg/24 hours Place 14 mg onto the skin daily as needed (smoking cessation).   oxyCODONE 5 MG immediate release tablet Commonly known as:  Oxy IR/ROXICODONE Take 1 tablet (5 mg total) by mouth every 4 (four) hours as needed for severe pain.   oxymetazoline 0.05 % nasal spray Commonly known as:  AFRIN Place 1 spray into both nostrils 2 (two) times daily as needed for congestion.   sertraline 100 MG tablet Commonly known as:  ZOLOFT Take 100 mg by mouth daily at 3 pm.   sodium chloride 0.65 % Soln nasal spray  Commonly known as:  OCEAN Place 1 spray into both nostrils as needed for congestion.      Follow-up Information    [name], MD Follow up on 03/17/2019.   Specialty:  Cardiothoracic Surgery Why:  Appointment is at 3:45, please get CXR at 3:15 at [facility] located on first floor of our office building Contact information: [place] [address] [phone]        [facility], [facility] Follow up.   Specialty:  Home Health Services Why:  Nurse to follow up with you at home.  They will call you to set up appointment. Please change right chest tube dressing at least every other day Contact information: [address] [place] [address] [phone]           Signed:  Lars Pinks PA-C 03/12/2019, 8:19 AM

## 2019-02-27 NOTE — Anesthesia Procedure Notes (Signed)
Arterial Line Insertion Start/End5/14/2020 7:00 AM, 02/27/2019 7:19 AM Performed by: Wilburn Cornelia, CRNA, CRNA  Patient location: Pre-op. Preanesthetic checklist: patient identified, IV checked, site marked, risks and benefits discussed, surgical consent, monitors and equipment checked, pre-op evaluation, timeout performed and anesthesia consent Left, radial was placed Catheter size: 20 G Hand hygiene performed , maximum sterile barriers used  and Seldinger technique used Allen's test indicative of satisfactory collateral circulation Attempts: 2 Procedure performed without using ultrasound guided technique. Following insertion, dressing applied and Biopatch. Post procedure assessment: normal  Patient tolerated the procedure well with no immediate complications.

## 2019-02-28 ENCOUNTER — Encounter (HOSPITAL_COMMUNITY): Payer: Self-pay | Admitting: Thoracic Surgery (Cardiothoracic Vascular Surgery)

## 2019-02-28 ENCOUNTER — Inpatient Hospital Stay (HOSPITAL_COMMUNITY): Payer: BC Managed Care – PPO

## 2019-02-28 LAB — BASIC METABOLIC PANEL
Anion gap: 7 (ref 5–15)
BUN: 10 mg/dL (ref 8–23)
CO2: 25 mmol/L (ref 22–32)
Calcium: 8.5 mg/dL — ABNORMAL LOW (ref 8.9–10.3)
Chloride: 106 mmol/L (ref 98–111)
Creatinine, Ser: 0.67 mg/dL (ref 0.44–1.00)
GFR calc Af Amer: >60 mL/min (ref >60)
GFR calc non Af Amer: >60 mL/min (ref >60)
Glucose, Bld: 146 mg/dL — ABNORMAL HIGH (ref 70–99)
Potassium: 4.2 mmol/L (ref 3.5–5.1)
Sodium: 138 mmol/L (ref 135–145)

## 2019-02-28 LAB — BLOOD GAS, ARTERIAL
Acid-Base Excess: 1.2 mmol/L (ref 0.0–2.0)
Bicarbonate: 26 mmol/L (ref 20.0–28.0)
Drawn by: 311011
O2 Content: 2 L/min
O2 Saturation: 98.8 %
Patient temperature: 97.9
pCO2 arterial: 46 mmHg (ref 32.0–48.0)
pH, Arterial: 7.368 (ref 7.350–7.450)
pO2, Arterial: 130 mmHg — ABNORMAL HIGH (ref 83.0–108.0)

## 2019-02-28 LAB — CBC
HCT: 31.9 % — ABNORMAL LOW (ref 36.0–46.0)
Hemoglobin: 10.3 g/dL — ABNORMAL LOW (ref 12.0–15.0)
MCH: 30.7 pg (ref 26.0–34.0)
MCHC: 32.3 g/dL (ref 30.0–36.0)
MCV: 95.2 fL (ref 80.0–100.0)
Platelets: 374 10*3/uL (ref 150–400)
RBC: 3.35 MIL/uL — ABNORMAL LOW (ref 3.87–5.11)
RDW: 14.8 % (ref 11.5–15.5)
WBC: 23.5 10*3/uL — ABNORMAL HIGH (ref 4.0–10.5)
nRBC: 0 % (ref 0.0–0.2)

## 2019-02-28 MED ORDER — ADULT MULTIVITAMIN W/MINERALS CH
1.0000 | ORAL_TABLET | Freq: Every day | ORAL | Status: DC
  Administered 2019-03-01 – 2019-03-12 (×12): 1 via ORAL
  Filled 2019-02-28 (×12): qty 1

## 2019-02-28 MED ORDER — ENSURE ENLIVE PO LIQD: 237.0000 mL | Freq: Three times a day (TID) | ORAL | Status: DC

## 2019-02-28 NOTE — Progress Notes (Signed)
1 Day Post-Op Procedure(s) (LRB): VIDEO ASSISTED THORACOSCOPY (Right) RIGHT UPPER WEDGE AND RIGHT LOWER SEGMENTECTOMY LYMPH NODE DISSECTION (Right) Subjective: Some incisional pain- controlled with PCA Denies nausea  Objective: Vital signs in last 24 hours: Temp:  [97.2 F (36.2 C)-99.1 F (37.3 C)] 98.2 F (36.8 C) (05/15 0733) Pulse Rate:  [63-85] 71 (05/15 0733) Cardiac Rhythm: Normal sinus rhythm (05/15 0300) Resp:  [15-98] 17 (05/15 0733) BP: (82-103)/(49-70) 93/58 (05/15 0733) SpO2:  [92 %-100 %] 93 % (05/15 0733) Arterial Line BP: (88-106)/(41-48) 94/47 (05/15 0300)  Hemodynamic parameters for last 24 hours:    Intake/Output from previous day: 05/14 0701 - 05/15 0700 In: 3152.9 [P.O.:480; I.V.:2572.9; IV Piggyback:100] Out: 1363 [Urine:1200; Blood:45; Chest Tube:118] Intake/Output this shift: Total I/O In: -  Out: 300 [Urine:300]  General appearance: alert, cooperative and no distress Neurologic: intact Heart: regular rate and rhythm Lungs: diminished breath sounds right base + air leak  Lab Results: Recent Labs    02/25/19 1400 02/27/19 0857 02/28/19 0313  WBC 12.7*  --  23.5*  HGB 13.0 11.9* 10.3*  HCT 39.8 35.0* 31.9*  PLT 495*  --  374   BMET:  Recent Labs    02/25/19 1400 02/27/19 0857 02/28/19 0313  NA 140 138 138  K 4.0 4.1 4.2  CL 106  --  106  CO2 23  --  25  GLUCOSE 104*  --  146*  BUN 6*  --  10  CREATININE 0.70  --  0.67  CALCIUM 9.8  --  8.5*    PT/INR:  Recent Labs    02/25/19 1400  LABPROT 12.5  INR 0.9   ABG    Component Value Date/Time   PHART 7.368 02/28/2019 0315   HCO3 26.0 02/28/2019 0315   TCO2 32 02/27/2019 0857   O2SAT 98.8 02/28/2019 0315   CBG (last 3)  No results for input(s): GLUCAP in the last 72 hours.  Assessment/Plan: S/P Procedure(s) (LRB): VIDEO ASSISTED THORACOSCOPY (Right) RIGHT UPPER WEDGE AND RIGHT LOWER SEGMENTECTOMY LYMPH NODE DISSECTION (Right) POD # 1   Doing well overall Dc A  line + air leak- keep CT to suction today SCD + enoxaparin + ambulation for DVT prophylaxis Bronchodilators Leukocytosis- likely inflammatory in nature, no evidence of infection, follow    LOS: 1 day    Madeline Gates 02/28/2019

## 2019-02-28 NOTE — Progress Notes (Signed)
Initial Nutrition Assessment  RD working remotely.  DOCUMENTATION CODES:   Underweight, suspect some degree of malnutrition given underweight BMI but unable to diagnose without NFPE  INTERVENTION:   - Ensure Enlive po TID, each supplement provides 350 kcal and 20 grams of protein  - MVI with minerals daily  NUTRITION DIAGNOSIS:   Increased nutrient needs related to cancer and cancer related treatments, post-op healing as evidenced by estimated needs.  GOAL:   Patient will meet greater than or equal to 90% of their needs  MONITOR:   PO intake, Supplement acceptance, Labs, Weight trends, I & O's  REASON FOR ASSESSMENT:   Other (underweight BMI)    ASSESSMENT:   63 year old female who presented on 5/14 for VATS, right upper and right lower segmentectomies, and lymph node dissection. PMH of tobacco use, non-small cell lung carcinoma stage IIIA, IBS.  Attempted to speak with pt via phone call to room but pt did not answer. No meal completion data recorded in pt's chart at this time.  Weight history in chart is very limited. Oldest weight available is from 01/03/19.  Given increased kcal and protein needs related to post-op healing, RD to order oral nutrition supplement. Will also order daily MVI with minerals.  Medications reviewed and include: Dulcolax, Senna, IV KCl 10 mEq daily IVF: D5 in NS @ 10 ml/hr  Labs reviewed.  UOP: 1200 ml x 24 hours CT: 118 ml x 24 hours I/O's: +1.2 L since admit  NUTRITION - FOCUSED PHYSICAL EXAM:  Unable to complete at this time. RD working remotely.  Diet Order:   Diet Order            Diet regular Room service appropriate? Yes; Fluid consistency: Thin  Diet effective now              EDUCATION NEEDS:   Not appropriate for education at this time  Skin:  Skin Assessment: Skin Integrity Issues: Incisions: closed surgical incision to right chest  Last BM:  02/26/19  Height:   Ht Readings from Last 1 Encounters:   02/27/19 5\' 5"  (1.651 m)    Weight:   Wt Readings from Last 1 Encounters:  02/27/19 48.7 kg    Ideal Body Weight:  56.8 kg  BMI:  Body mass index is 17.86 kg/m.  Estimated Nutritional Needs:   Kcal:  1400-1600  Protein:  70-80 grams  Fluid:  >/= 1.4 L    Gaynell Face, MS, RD, LDN Inpatient Clinical Dietitian Pager: [phone] Weekend/After Hours: [phone]

## 2019-03-01 ENCOUNTER — Inpatient Hospital Stay (HOSPITAL_COMMUNITY): Payer: BC Managed Care – PPO

## 2019-03-01 LAB — CBC
HCT: 32.9 % — ABNORMAL LOW (ref 36.0–46.0)
Hemoglobin: 10.6 g/dL — ABNORMAL LOW (ref 12.0–15.0)
MCH: 30.5 pg (ref 26.0–34.0)
MCHC: 32.2 g/dL (ref 30.0–36.0)
MCV: 94.8 fL (ref 80.0–100.0)
Platelets: 384 10*3/uL (ref 150–400)
RBC: 3.47 MIL/uL — ABNORMAL LOW (ref 3.87–5.11)
RDW: 15.1 % (ref 11.5–15.5)
WBC: 16.6 10*3/uL — ABNORMAL HIGH (ref 4.0–10.5)
nRBC: 0 % (ref 0.0–0.2)

## 2019-03-01 LAB — COMPREHENSIVE METABOLIC PANEL
ALT: 12 U/L (ref 0–44)
AST: 17 U/L (ref 15–41)
Albumin: 3.1 g/dL — ABNORMAL LOW (ref 3.5–5.0)
Alkaline Phosphatase: 62 U/L (ref 38–126)
Anion gap: 11 (ref 5–15)
BUN: 8 mg/dL (ref 8–23)
CO2: 24 mmol/L (ref 22–32)
Calcium: 8.9 mg/dL (ref 8.9–10.3)
Chloride: 105 mmol/L (ref 98–111)
Creatinine, Ser: 0.62 mg/dL (ref 0.44–1.00)
GFR calc Af Amer: >60 mL/min (ref >60)
GFR calc non Af Amer: >60 mL/min (ref >60)
Glucose, Bld: 119 mg/dL — ABNORMAL HIGH (ref 70–99)
Potassium: 4 mmol/L (ref 3.5–5.1)
Sodium: 140 mmol/L (ref 135–145)
Total Bilirubin: 0.4 mg/dL (ref 0.3–1.2)
Total Protein: 6 g/dL — ABNORMAL LOW (ref 6.5–8.1)

## 2019-03-01 MED ORDER — LEVALBUTEROL HCL 0.63 MG/3ML IN NEBU: 0.6300 mg | INHALATION_SOLUTION | Freq: Four times a day (QID) | RESPIRATORY_TRACT | Status: DC | PRN

## 2019-03-01 MED ORDER — LEVALBUTEROL HCL 0.63 MG/3ML IN NEBU
0.6300 mg | INHALATION_SOLUTION | Freq: Two times a day (BID) | RESPIRATORY_TRACT | Status: DC
  Administered 2019-03-01 (×2): 0.63 mg via RESPIRATORY_TRACT
  Filled 2019-03-01 (×2): qty 3

## 2019-03-01 NOTE — Progress Notes (Signed)
New Morphine syringe placed in PCA, 2.60mL wasted with Lelon Frohlich RN in [facility]

## 2019-03-01 NOTE — Progress Notes (Addendum)
2 Days Post-Op Procedure(s) (LRB): VIDEO ASSISTED THORACOSCOPY (Right) RIGHT UPPER WEDGE AND RIGHT LOWER SEGMENTECTOMY LYMPH NODE DISSECTION (Right) Subjective: Sitting up in bed.  Says she is still having some chest discomfort but the PCA is controlling this adequately.  No new problems reported.  Objective: Vital signs in last 24 hours: Temp:  [98 F (36.7 C)-98.4 F (36.9 C)] 98.1 F (36.7 C) (05/16 0347) Pulse Rate:  [71-79] 72 (05/16 0347) Cardiac Rhythm: Normal sinus rhythm (05/15 2145) Resp:  [14-22] 16 (05/16 0347) BP: (89-119)/(57-79) 118/79 (05/16 0347) SpO2:  [90 %-97 %] 92 % (05/16 0347)    Intake/Output from previous day: 05/15 0701 - 05/16 0700 In: -  Out: 1130 [Urine:800; Chest Tube:330] Intake/Output this shift: Total I/O In: -  Out: 800 [Urine:650; Chest Tube:150]  General appearance: alert, cooperative and mild distress Neurologic: intact Heart: regular rate and rhythm Lungs: Breath sounds are clear on the left diminished with crackles on the right.  She has some palpable subcu air lateral to the right scapula and in the axilla.  There is a small but fairly continuous active air leak.  Chest x-ray this morning demonstrates subcu air in the right lateral chest wall and axilla.  No pneumothorax.  Expected density in the right mid lung zone medially is without significant change. Extremities: No edema or tenderness Wound: The right thoracotomy incision is clean and dry  Lab Results: Recent Labs    02/28/19 0313 03/01/19 0228  WBC 23.5* 16.6*  HGB 10.3* 10.6*  HCT 31.9* 32.9*  PLT 374 384   BMET:  Recent Labs    02/28/19 0313 03/01/19 0228  NA 138 140  K 4.2 4.0  CL 106 105  CO2 25 24  GLUCOSE 146* 119*  BUN 10 8  CREATININE 0.67 0.62  CALCIUM 8.5* 8.9    PT/INR: No results for input(s): LABPROT, INR in the last 72 hours. ABG    Component Value Date/Time   PHART 7.368 02/28/2019 0315   HCO3 26.0 02/28/2019 0315   TCO2 32 02/27/2019 0857    O2SAT 98.8 02/28/2019 0315   CBG (last 3)  No results for input(s): GLUCAP in the last 72 hours.  Assessment/Plan: S/P Procedure(s) (LRB): VIDEO ASSISTED THORACOSCOPY (Right) RIGHT UPPER WEDGE AND RIGHT LOWER SEGMENTECTOMY LYMPH NODE DISSECTION (Right)  -Postop day 2 right video-assisted thoracoscopy with right lower lobe medial and posterior segmentectomy and wedge resection of right upper lobe nodule with lymph node dissection for stage IIIa non-small cell carcinoma.  Stable respiratory status with oxygen saturation of 96% on room air.  She continues to have an active air leak and chest x-ray this morning shows new subcutaneous air in the right chest wall.    Repeat chest x-ray tomorrow morning.  -Leukocytosis-she remains afebrile, white blood count is trending down.  -Pain management-adequate control with PCA.  Will encourage transition to oral analgesics today and plan to discontinue PCA tomorrow morning.  -Nicotine dependence, history of anxiety-continue transdermal nicotine patch and Zoloft  -DVT prophylaxis-continue subcu enoxaparin daily and mobilize    LOS: 2 days    Antony Odea 03/01/2019   Patient examined and chest x-ray image personally reviewed Minimal air leak and minimal drainage today Chest tube placed to waterseal, chest x-ray tomorrow Dahlia Byes MD

## 2019-03-02 ENCOUNTER — Inpatient Hospital Stay (HOSPITAL_COMMUNITY): Payer: BC Managed Care – PPO

## 2019-03-02 LAB — BASIC METABOLIC PANEL
Anion gap: 9 (ref 5–15)
BUN: 7 mg/dL — ABNORMAL LOW (ref 8–23)
CO2: 29 mmol/L (ref 22–32)
Calcium: 9 mg/dL (ref 8.9–10.3)
Chloride: 100 mmol/L (ref 98–111)
Creatinine, Ser: 0.54 mg/dL (ref 0.44–1.00)
GFR calc Af Amer: >60 mL/min (ref >60)
GFR calc non Af Amer: >60 mL/min (ref >60)
Glucose, Bld: 105 mg/dL — ABNORMAL HIGH (ref 70–99)
Potassium: 3.7 mmol/L (ref 3.5–5.1)
Sodium: 138 mmol/L (ref 135–145)

## 2019-03-02 LAB — CBC
HCT: 31.6 % — ABNORMAL LOW (ref 36.0–46.0)
Hemoglobin: 10.5 g/dL — ABNORMAL LOW (ref 12.0–15.0)
MCH: 31.2 pg (ref 26.0–34.0)
MCHC: 33.2 g/dL (ref 30.0–36.0)
MCV: 93.8 fL (ref 80.0–100.0)
Platelets: 377 10*3/uL (ref 150–400)
RBC: 3.37 MIL/uL — ABNORMAL LOW (ref 3.87–5.11)
RDW: 14.3 % (ref 11.5–15.5)
WBC: 13.1 10*3/uL — ABNORMAL HIGH (ref 4.0–10.5)
nRBC: 0 % (ref 0.0–0.2)

## 2019-03-02 MED ORDER — METOPROLOL TARTRATE 5 MG/5ML IV SOLN
5.0000 mg | INTRAVENOUS | Status: AC
  Administered 2019-03-02: 5 mg via INTRAVENOUS
  Filled 2019-03-02: qty 5

## 2019-03-02 MED ORDER — AMIODARONE IV BOLUS ONLY 150 MG/100ML
150.0000 mg | Freq: Once | INTRAVENOUS | Status: AC
  Administered 2019-03-02: 150 mg via INTRAVENOUS
  Filled 2019-03-02: qty 100

## 2019-03-02 MED ORDER — METOPROLOL TARTRATE 12.5 MG HALF TABLET
12.5000 mg | ORAL_TABLET | Freq: Two times a day (BID) | ORAL | Status: DC
  Administered 2019-03-02 – 2019-03-11 (×15): 12.5 mg via ORAL
  Filled 2019-03-02 (×19): qty 1

## 2019-03-02 MED ORDER — PRO-STAT SUGAR FREE PO LIQD
30.0000 mL | Freq: Two times a day (BID) | ORAL | Status: DC
  Administered 2019-03-02 – 2019-03-12 (×19): 30 mL via ORAL
  Filled 2019-03-02 (×22): qty 30

## 2019-03-02 MED ORDER — DEXTROSE 5 % IV SOLN
60.0000 mg/h | INTRAVENOUS | Status: AC
  Administered 2019-03-02: 60 mg/h via INTRAVENOUS
  Filled 2019-03-02: qty 9

## 2019-03-02 MED ORDER — DEXTROSE 5 % IV SOLN
30.0000 mg/h | INTRAVENOUS | Status: DC
  Administered 2019-03-02: 17:00:00 30 mg/h via INTRAVENOUS
  Filled 2019-03-02: qty 9

## 2019-03-02 MED ORDER — POTASSIUM CHLORIDE CRYS ER 20 MEQ PO TBCR
20.0000 meq | EXTENDED_RELEASE_TABLET | Freq: Two times a day (BID) | ORAL | Status: AC
  Administered 2019-03-02 – 2019-03-03 (×3): 20 meq via ORAL
  Filled 2019-03-02 (×3): qty 1

## 2019-03-02 NOTE — Progress Notes (Addendum)
3 Days Post-Op Procedure(s) (LRB): VIDEO ASSISTED THORACOSCOPY (Right) RIGHT UPPER WEDGE AND RIGHT LOWER SEGMENTECTOMY LYMPH NODE DISSECTION (Right) Subjective: Awake but groggy.  Nurse reports she has been using the PCA frequently.  No new problems reported.  Objective: Vital signs in last 24 hours: Temp:  [97.6 F (36.4 C)-98.2 F (36.8 C)] 97.6 F (36.4 C) (05/17 0728) Pulse Rate:  [66-78] 78 (05/17 0300) Cardiac Rhythm: Normal sinus rhythm (05/16 2044) Resp:  [15-21] 17 (05/17 0300) BP: (101-118)/(57-78) 105/65 (05/17 0728) SpO2:  [92 %-99 %] 95 % (05/17 0728)    Intake/Output from previous day: 05/16 0701 - 05/17 0700 In: 594.7 [P.O.:540; IV Piggyback:54.7] Out: 1700 [Urine:1350; Chest Tube:350] Intake/Output this shift: No intake/output data recorded.  General appearance: alert, cooperative and mild distress Neurologic: intact Heart: regular rate and rhythm Lungs: Breath sounds are clear on the left diminished with crackles on the right.  She has some palpable subcu air lateral to the right scapula and in the axilla that has not changed significantly since yesterday.    The chest tube is on waterseal.  There is a small air leak seen only with cough today. Chest x-ray this morning demonstrates slight increase in the subcu air and a new apical pneumothorax measuring 2.4 cm that was not present yesterday. Expected density in the right mid lung zone medially is without significant change. Extremities: No edema or tenderness Wound: The right thoracotomy incision is clean and dry  Lab Results: Recent Labs    03/01/19 0228 03/02/19 0247  WBC 16.6* 13.1*  HGB 10.6* 10.5*  HCT 32.9* 31.6*  PLT 384 377   BMET:  Recent Labs    03/01/19 0228 03/02/19 0247  NA 140 138  K 4.0 3.7  CL 105 100  CO2 24 29  GLUCOSE 119* 105*  BUN 8 7*  CREATININE 0.62 0.54  CALCIUM 8.9 9.0    PT/INR: No results for input(s): LABPROT, INR in the last 72 hours. ABG    Component Value  Date/Time   PHART 7.368 02/28/2019 0315   HCO3 26.0 02/28/2019 0315   TCO2 32 02/27/2019 0857   O2SAT 98.8 02/28/2019 0315   CBG (last 3)  No results for input(s): GLUCAP in the last 72 hours.  Assessment/Plan: S/P Procedure(s) (LRB): VIDEO ASSISTED THORACOSCOPY (Right) RIGHT UPPER WEDGE AND RIGHT LOWER SEGMENTECTOMY LYMPH NODE DISSECTION (Right)  -Postop day 3 right video-assisted thoracoscopy with right lower lobe medial and posterior segmentectomy and wedge resection of right upper lobe nodule with lymph node dissection for stage IIIa non-small cell carcinoma.  Stable respiratory status with acceptable oxygen saturation on room air.  Chest tube is on waterseal.  Air leak is smaller today but chest x-ray shows a slight increase in the subcu air and a newly developed apical pneumothorax.  Will place chest tube back to suction.   Repeat chest x-ray tomorrow morning.  -Leukocytosis-she remains afebrile, white blood count continues to trend toward normal  -Pain management-we will transition to oral pain medications today and discontinue PCA  -Nicotine dependence, history of anxiety-continue transdermal nicotine patch and Zoloft  -DVT prophylaxis-continue subcu enoxaparin daily and mobilize    LOS: 3 days    Antony Odea, PA-C [phone] 03/02/2019  Increase in apical space while off suction-we will place back to suction. patient examined and medical record reviewed,agree with above note. Tharon Aquas Trigt III 03/02/2019  Late morning the patient developed atrial fibrillation heart rate 1 10-1 20 IV amiodarone protocol initiated after 1 dose of  IV metoprolol 5 mg did not convert patient.  Oral metoprolol also started.

## 2019-03-02 NOTE — Plan of Care (Signed)

## 2019-03-02 NOTE — Progress Notes (Signed)
10-cc morphine wasted to steri-cycle from  A DC PCA pump, with a second nurse Amina witnessing.

## 2019-03-03 ENCOUNTER — Inpatient Hospital Stay (HOSPITAL_COMMUNITY): Payer: BC Managed Care – PPO

## 2019-03-03 LAB — BASIC METABOLIC PANEL
Anion gap: 9 (ref 5–15)
BUN: 10 mg/dL (ref 8–23)
CO2: 28 mmol/L (ref 22–32)
Calcium: 9 mg/dL (ref 8.9–10.3)
Chloride: 99 mmol/L (ref 98–111)
Creatinine, Ser: 0.6 mg/dL (ref 0.44–1.00)
GFR calc Af Amer: >60 mL/min (ref >60)
GFR calc non Af Amer: >60 mL/min (ref >60)
Glucose, Bld: 108 mg/dL — ABNORMAL HIGH (ref 70–99)
Potassium: 3.8 mmol/L (ref 3.5–5.1)
Sodium: 136 mmol/L (ref 135–145)

## 2019-03-03 LAB — CBC
HCT: 34 % — ABNORMAL LOW (ref 36.0–46.0)
Hemoglobin: 11.4 g/dL — ABNORMAL LOW (ref 12.0–15.0)
MCH: 30.9 pg (ref 26.0–34.0)
MCHC: 33.5 g/dL (ref 30.0–36.0)
MCV: 92.1 fL (ref 80.0–100.0)
Platelets: 411 10*3/uL — ABNORMAL HIGH (ref 150–400)
RBC: 3.69 MIL/uL — ABNORMAL LOW (ref 3.87–5.11)
RDW: 13.8 % (ref 11.5–15.5)
WBC: 15.2 10*3/uL — ABNORMAL HIGH (ref 4.0–10.5)
nRBC: 0 % (ref 0.0–0.2)

## 2019-03-03 MED ORDER — ONDANSETRON HCL 4 MG/2ML IJ SOLN
4.0000 mg | Freq: Four times a day (QID) | INTRAMUSCULAR | Status: DC | PRN
  Administered 2019-03-03 – 2019-03-09 (×3): 4 mg via INTRAVENOUS
  Filled 2019-03-03 (×4): qty 2

## 2019-03-03 MED ORDER — AMIODARONE HCL 200 MG PO TABS
200.0000 mg | ORAL_TABLET | Freq: Two times a day (BID) | ORAL | Status: DC
  Administered 2019-03-03 – 2019-03-12 (×19): 200 mg via ORAL
  Filled 2019-03-03 (×19): qty 1

## 2019-03-03 NOTE — Progress Notes (Addendum)
      [address]       [place] [address]             [phone]      4 Days Post-Op Procedure(s) (LRB): VIDEO ASSISTED THORACOSCOPY (Right) RIGHT UPPER WEDGE AND RIGHT LOWER SEGMENTECTOMY LYMPH NODE DISSECTION (Right)   Subjective:  Patient states this is the worst she has felt since surgery.  She is experiencing some nausea.  Pain persists.  Objective: Vital signs in last 24 hours: Temp:  [97.9 F (36.6 C)-98.8 F (37.1 C)] 97.9 F (36.6 C) (05/18 0300) Pulse Rate:  [64-139] 67 (05/18 0300) Cardiac Rhythm: Normal sinus rhythm (05/17 2027) Resp:  [14-20] 15 (05/18 0300) BP: (96-105)/(61-85) 101/61 (05/18 0300) SpO2:  [90 %-99 %] 98 % (05/18 0300)  Intake/Output from previous day: 05/17 0701 - 05/18 0700 In: 550 [P.O.:460; I.V.:90] Out: 620 [Urine:500; Chest Tube:120]  General appearance: alert, cooperative and no distress Heart: regular rate and rhythm Lungs: clear to auscultation bilaterally Abdomen: soft, non-tender; bowel sounds normal; no masses,  no organomegaly Extremities: extremities normal, atraumatic, no cyanosis or edema Wound: clean and dry  Lab Results: Recent Labs    03/02/19 0247 03/03/19 0405  WBC 13.1* 15.2*  HGB 10.5* 11.4*  HCT 31.6* 34.0*  PLT 377 411*   BMET:  Recent Labs    03/02/19 0247 03/03/19 0405  NA 138 136  K 3.7 3.8  CL 100 99  CO2 29 28  GLUCOSE 105* 108*  BUN 7* 10  CREATININE 0.54 0.60  CALCIUM 9.0 9.0    PT/INR: No results for input(s): LABPROT, INR in the last 72 hours. ABG    Component Value Date/Time   PHART 7.368 02/28/2019 0315   HCO3 26.0 02/28/2019 0315   TCO2 32 02/27/2019 0857   O2SAT 98.8 02/28/2019 0315   CBG (last 3)  No results for input(s): GLUCAP in the last 72 hours.  Assessment/Plan: S/P Procedure(s) (LRB): VIDEO ASSISTED THORACOSCOPY (Right) RIGHT UPPER WEDGE AND RIGHT LOWER SEGMENTECTOMY LYMPH NODE DISSECTION (Right)  1. CV- PAF overnight, will convert to oral  Amiodarone 200 mg BID, continue Lopressor 12.5 mg BID... IV amiodarone was running through peripheral IV, watch for signs of phlebitis 2. Pulm- Chest tube-120 cc output yesterday, + air leak on suction, CXR with stable sub q emphysema, small right apical space, leave on suction today as sub q air increased with water seal over the weekend 3. Renal- creatinine remains stable, off IV fluids 4. Lovenox for DVT prophylaxis 5. GI- nausea, amiodarone is likely contributing, has moved bowels, abd exam benign, will add zofran prn...  6. Dispo-patient in NSR this morning, transition off IV amiodarone, + air leak on suction, leave chest tube as is today, repeat CXR in AM   LOS: 4 days    Ellwood Handler 03/03/2019 Patient seen and examined, doesn't feel as well today Still having a lot of nausea Path still pending Air leak is small after dressing changed  Remo Lipps C. Roxan Hockey, MD [facility] [phone]

## 2019-03-04 ENCOUNTER — Inpatient Hospital Stay (HOSPITAL_COMMUNITY): Payer: BC Managed Care – PPO

## 2019-03-04 NOTE — Progress Notes (Signed)
Patient noted to have bilateral redness and swelling in the antecubital areas of the arms.  Patient notes tenderness and pain. Warmth noted at sites.  Patient states that she noticed this change in the morning, but it has worsened.  Hendrickson notified and recommended warm compresses and elevation.  Recommendations have been implemented - will continue to monitor patient closely.

## 2019-03-04 NOTE — Progress Notes (Signed)
      [address]       [place] [address]             [phone]      5 Days Post-Op Procedure(s) (LRB): VIDEO ASSISTED THORACOSCOPY (Right) RIGHT UPPER WEDGE AND RIGHT LOWER SEGMENTECTOMY LYMPH NODE DISSECTION (Right)   Subjective:  Patient feeling better today.  Her nausea has improved with use of Zofran.  States she didn't eat much stating her stomach feels small.  I encouraged her to just eat what she can tolerate.    Objective: Vital signs in last 24 hours: Temp:  [97.8 F (36.6 C)-98.8 F (37.1 C)] 97.8 F (36.6 C) (05/19 0406) Pulse Rate:  [65-107] 69 (05/19 0011) Cardiac Rhythm: Normal sinus rhythm (05/19 0700) Resp:  [14-22] 16 (05/19 0602) BP: (93-109)/(60-67) 98/61 (05/19 0406) SpO2:  [93 %-97 %] 96 % (05/19 0011)  Intake/Output from previous day: 05/18 0701 - 05/19 0700 In: 340 [P.O.:340] Out: 240 [Chest Tube:240]  General appearance: alert, cooperative and no distress Heart: regular rate and rhythm Lungs: clear to auscultation bilaterally Abdomen: soft, non-tender; bowel sounds normal; no masses,  no organomegaly Extremities: extremities normal, atraumatic, no cyanosis or edema Wound: clean and dry  Lab Results: Recent Labs    03/02/19 0247 03/03/19 0405  WBC 13.1* 15.2*  HGB 10.5* 11.4*  HCT 31.6* 34.0*  PLT 377 411*   BMET:  Recent Labs    03/02/19 0247 03/03/19 0405  NA 138 136  K 3.7 3.8  CL 100 99  CO2 29 28  GLUCOSE 105* 108*  BUN 7* 10  CREATININE 0.54 0.60  CALCIUM 9.0 9.0    PT/INR: No results for input(s): LABPROT, INR in the last 72 hours. ABG    Component Value Date/Time   PHART 7.368 02/28/2019 0315   HCO3 26.0 02/28/2019 0315   TCO2 32 02/27/2019 0857   O2SAT 98.8 02/28/2019 0315   CBG (last 3)  No results for input(s): GLUCAP in the last 72 hours.  Assessment/Plan: S/P Procedure(s) (LRB): VIDEO ASSISTED THORACOSCOPY (Right) RIGHT UPPER WEDGE AND RIGHT LOWER SEGMENTECTOMY LYMPH NODE  DISSECTION (Right)  1. CV- PAF, maintaining NSR- continue Amiodarone, Lopressor 2. Pulm- CT with 1+ air leak with cough, sub q air appears slightly increased on CXR today, no significant pneumothorax appreciated, leave chest tube on suction today 3. GI- nausea improved with zofran, continue prn usage, increase oral intake as able, she has moved her bowels 4. Lovenox for DVT prophylaxis 5. Dispo- patient stable, remains in NSR on Amiodarone, Lopressor, CT with continued 1+ air leak, slightly increased sub q air on CXR, leave chest tube to suction today, continue current care   LOS: 5 days    Ellwood Handler 03/04/2019

## 2019-03-05 ENCOUNTER — Inpatient Hospital Stay (HOSPITAL_COMMUNITY): Payer: BC Managed Care – PPO

## 2019-03-05 MED ORDER — CEPHALEXIN 500 MG PO CAPS
500.0000 mg | ORAL_CAPSULE | Freq: Two times a day (BID) | ORAL | Status: AC
  Administered 2019-03-05 – 2019-03-11 (×14): 500 mg via ORAL
  Filled 2019-03-05 (×14): qty 1

## 2019-03-05 MED ORDER — ONDANSETRON HCL 4 MG PO TABS
4.0000 mg | ORAL_TABLET | Freq: Four times a day (QID) | ORAL | Status: DC | PRN
  Administered 2019-03-05 – 2019-03-11 (×6): 4 mg via ORAL
  Filled 2019-03-05 (×6): qty 1

## 2019-03-05 MED ORDER — MAGIC MOUTHWASH
10.0000 mL | Freq: Three times a day (TID) | ORAL | Status: AC
  Administered 2019-03-05 – 2019-03-06 (×2): 10 mL via ORAL
  Filled 2019-03-05 (×7): qty 10

## 2019-03-05 NOTE — Progress Notes (Signed)
6 Days Post-Op Procedure(s) (LRB): VIDEO ASSISTED THORACOSCOPY (Right) RIGHT UPPER WEDGE AND RIGHT LOWER SEGMENTECTOMY LYMPH NODE DISSECTION (Right) Subjective: C/o pain swelling right elbow and pain in mouth  Objective: Vital signs in last 24 hours: Temp:  [98.2 F (36.8 C)-98.5 F (36.9 C)] 98.4 F (36.9 C) (05/20 0722) Pulse Rate:  [70-79] 79 (05/19 2300) Cardiac Rhythm: Normal sinus rhythm (05/20 0400) Resp:  [13-21] 20 (05/20 0722) BP: (94-112)/(48-64) 94/50 (05/20 0722) SpO2:  [95 %-100 %] 98 % (05/19 2300)  Hemodynamic parameters for last 24 hours:    Intake/Output from previous day: 05/19 0701 - 05/20 0700 In: 360 [P.O.:360] Out: 100 [Chest Tube:100] Intake/Output this shift: No intake/output data recorded.  General appearance: alert, cooperative and no distress Neurologic: intact Heart: regular rate and rhythm Lungs: diminished breath sounds right base Extremities: cellulitis right antecubital foassa small air leak  Lab Results: Recent Labs    03/03/19 0405  WBC 15.2*  HGB 11.4*  HCT 34.0*  PLT 411*   BMET:  Recent Labs    03/03/19 0405  NA 136  K 3.8  CL 99  CO2 28  GLUCOSE 108*  BUN 10  CREATININE 0.60  CALCIUM 9.0    PT/INR: No results for input(s): LABPROT, INR in the last 72 hours. ABG    Component Value Date/Time   PHART 7.368 02/28/2019 0315   HCO3 26.0 02/28/2019 0315   TCO2 32 02/27/2019 0857   O2SAT 98.8 02/28/2019 0315   CBG (last 3)  No results for input(s): GLUCAP in the last 72 hours.  Assessment/Plan: S/P Procedure(s) (LRB): VIDEO ASSISTED THORACOSCOPY (Right) RIGHT UPPER WEDGE AND RIGHT LOWER SEGMENTECTOMY LYMPH NODE DISSECTION (Right) -Still has a small air leak, will try on water seal again today Cellulitis right antecubital fossa- warm compresses, keflex Oral thrush- magic mouthwash Maintaining SR on amiodarone SCD + enoxaparin for DVT prophylaxis   LOS: 6 days    [name] 03/05/2019

## 2019-03-06 ENCOUNTER — Other Ambulatory Visit: Payer: Self-pay | Admitting: *Deleted

## 2019-03-06 ENCOUNTER — Inpatient Hospital Stay (HOSPITAL_COMMUNITY): Payer: BC Managed Care – PPO

## 2019-03-06 NOTE — Progress Notes (Signed)
The proposed treatment discussed at cancer conference 03/06/2019 is for discussion purpose only and is not a binding recommendation.  The patient was not physically examined nor present for their treatment options.  Therefore, final treatment plans cannot be decided.

## 2019-03-06 NOTE — Progress Notes (Signed)
      [address]       [place] [address]             [phone]      7 Days Post-Op Procedure(s) (LRB): VIDEO ASSISTED THORACOSCOPY (Right) RIGHT UPPER WEDGE AND RIGHT LOWER SEGMENTECTOMY LYMPH NODE DISSECTION (Right)   Subjective:  Patient is feeling better.  She states mouthwash has helped her mouth discomfort.  Her arm is feeling a little better as well.  Objective: Vital signs in last 24 hours: Temp:  [97.9 F (36.6 C)-98.3 F (36.8 C)] 98.2 F (36.8 C) (05/21 0400) Pulse Rate:  [66-74] 66 (05/21 0400) Cardiac Rhythm: Normal sinus rhythm (05/21 0400) Resp:  [15-19] 15 (05/21 0400) BP: (91-125)/(55-68) 94/58 (05/21 0400) SpO2:  [95 %-97 %] 95 % (05/21 0400)  Intake/Output from previous day: 05/20 0701 - 05/21 0700 In: 120 [P.O.:120] Out: 90 [Chest Tube:90]  General appearance: alert, cooperative and no distress Heart: regular rate and rhythm Lungs: clear to auscultation bilaterally Abdomen: soft, non-tender; bowel sounds normal; no masses,  no organomegaly Extremities: RUE erythematous, indurated, tender and hard to touch Wound: clean and dry  Lab Results: No results for input(s): WBC, HGB, HCT, PLT in the last 72 hours. BMET: No results for input(s): NA, K, CL, CO2, GLUCOSE, BUN, CREATININE, CALCIUM in the last 72 hours.  PT/INR: No results for input(s): LABPROT, INR in the last 72 hours. ABG    Component Value Date/Time   PHART 7.368 02/28/2019 0315   HCO3 26.0 02/28/2019 0315   TCO2 32 02/27/2019 0857   O2SAT 98.8 02/28/2019 0315   CBG (last 3)  No results for input(s): GLUCAP in the last 72 hours.  Assessment/Plan: S/P Procedure(s) (LRB): VIDEO ASSISTED THORACOSCOPY (Right) RIGHT UPPER WEDGE AND RIGHT LOWER SEGMENTECTOMY LYMPH NODE DISSECTION (Right)  1. CV- PAF, maintaining NSR on Amiodarone, Lopressor 2. Pulm- chest tube on water seal, + air leak, dressing redone, CXR appears stable with right apical space and sub q air 3.  Oral Thrush- continue magic mouthwash 4. RUE Cellulitis/Phlebitis- likely due to Amiodarone infusion, continue Keflex, warm compresses 5. Continue Lovenox for DVT prophylaxis 6. Dispo- patient stable, arm pain improved, mouth discomfort improved, keep chest tube on water as air leak is present, CXR remains stable, repeat in AM   LOS: 7 days    Ellwood Handler 03/06/2019

## 2019-03-06 NOTE — Progress Notes (Signed)
Nutrition Follow-up  RD working remotely.  DOCUMENTATION CODES:   Underweight, suspect some degree of malnutrition given underweight BMI but unable to diagnose without NFPE  INTERVENTION:   - Magic cup TID with meals, each supplement provides 290 kcal and 9 grams of protein  - Continue Pro-stat 30 ml BID, each packet contains 100 kcal and 15 grams of protein  - Continue MVI with minerals daily  NUTRITION DIAGNOSIS:   Increased nutrient needs related to cancer and cancer related treatments, post-op healing as evidenced by estimated needs.  Ongoing, being addressed via oral nutrition supplements  GOAL:   Patient will meet greater than or equal to 90% of their needs  Progressing  MONITOR:   PO intake, Supplement acceptance, Labs, Weight trends, I & O's  REASON FOR ASSESSMENT:   Other (underweight BMI)    ASSESSMENT:   63 year old female who presented on 5/14 for VATS, right upper and right lower segmentectomies, and lymph node dissection. PMH of tobacco use, non-small cell lung carcinoma stage IIIA, IBS.  Attempted to speak with pt via phone call to room but pt did not answer. Per CVTS notes, pt has been experiencing some nausea. Per note on 5/19, "states she didn't eat much stating her stomach feels small." PA encouraged pt to eat what she can tolerate. MD added Zofran PRN. MD also added Magic Mouthwash for oral thrush. Per PA note this AM, pt reports mouthwash has helped her mouth discomfort.  RD ordered Ensure Enlive TID after initiation visit. Note that this has been d/c. Will order Magic Cup TID with meals. Suspect cold texture of Magic Cup may ease some of pt's mouth discomfort.  Noted plan to keep chest tube on water as air leak is present. CXR remains stable per CVTS.  No new weight since admission.  Meal Completion: 0-75% x last 8 recorded meals (last meal record is from 5/19), averaging 45%  Medications reviewed and include: Dulcolax, Pro-stat 30 ml BID, MVI  with minerals, Senna  Labs reviewed.  CT: 90 ml x 24 hours  Diet Order:   Diet Order            Diet regular Room service appropriate? Yes; Fluid consistency: Thin  Diet effective now              EDUCATION NEEDS:   Not appropriate for education at this time  Skin:  Skin Assessment: Skin Integrity Issues: Incisions: closed surgical incision to right chest  Last BM:  03/02/19  Height:   Ht Readings from Last 1 Encounters:  02/27/19 5\' 5"  (1.651 m)    Weight:   Wt Readings from Last 1 Encounters:  02/27/19 48.7 kg    Ideal Body Weight:  56.8 kg  BMI:  Body mass index is 17.86 kg/m.  Estimated Nutritional Needs:   Kcal:  1400-1600  Protein:  70-80 grams  Fluid:  >/= 1.4 L    Gaynell Face, MS, RD, LDN Inpatient Clinical Dietitian Pager: [phone] Weekend/After Hours: [phone]

## 2019-03-07 ENCOUNTER — Ambulatory Visit (HOSPITAL_COMMUNITY): Payer: BLUE CROSS/BLUE SHIELD | Admitting: Hematology

## 2019-03-07 ENCOUNTER — Inpatient Hospital Stay (HOSPITAL_COMMUNITY): Payer: BC Managed Care – PPO

## 2019-03-07 NOTE — Progress Notes (Signed)
Dressing done to chest tube site , tubing loose  Has 1 lower suture holding it in place with dripping of pleural fluid noted. Dressing renewed. PA Gold informed will come and assess site.

## 2019-03-07 NOTE — Progress Notes (Signed)
      [address]       [place] [address]             [phone]      8 Days Post-Op Procedure(s) (LRB): VIDEO ASSISTED THORACOSCOPY (Right) RIGHT UPPER WEDGE AND RIGHT LOWER SEGMENTECTOMY LYMPH NODE DISSECTION (Right)   Subjective:  No new complaints.  Getting frustrated about chest tube.  Objective: Vital signs in last 24 hours: Temp:  [97.9 F (36.6 C)-98.3 F (36.8 C)] 98.1 F (36.7 C) (05/22 0736) Pulse Rate:  [67-76] 68 (05/22 0351) Cardiac Rhythm: Normal sinus rhythm (05/22 0736) Resp:  [13-16] 14 (05/22 0351) BP: (91-108)/(42-60) 108/60 (05/22 0736) SpO2:  [94 %-98 %] 94 % (05/22 0351)  Intake/Output from previous day: 05/21 0701 - 05/22 0700 In: 240 [P.O.:240] Out: 70 [Chest Tube:70]  General appearance: alert, cooperative and no distress Heart: regular rate and rhythm Lungs: clear to auscultation bilaterally Abdomen: soft, non-tender; bowel sounds normal; no masses,  no organomegaly Extremities: extremities normal, atraumatic, no cyanosis or edema Wound: clean, serous drainage from around chest tube  Lab Results: No results for input(s): WBC, HGB, HCT, PLT in the last 72 hours. BMET: No results for input(s): NA, K, CL, CO2, GLUCOSE, BUN, CREATININE, CALCIUM in the last 72 hours.  PT/INR: No results for input(s): LABPROT, INR in the last 72 hours. ABG    Component Value Date/Time   PHART 7.368 02/28/2019 0315   HCO3 26.0 02/28/2019 0315   TCO2 32 02/27/2019 0857   O2SAT 98.8 02/28/2019 0315   CBG (last 3)  No results for input(s): GLUCAP in the last 72 hours.  Assessment/Plan: S/P Procedure(s) (LRB): VIDEO ASSISTED THORACOSCOPY (Right) RIGHT UPPER WEDGE AND RIGHT LOWER SEGMENTECTOMY LYMPH NODE DISSECTION (Right)  1. CV- PAF, maintaining NSR on Amiodarone, Lopressor 2. Pulm- chest tube with persistent air leak, CXR has remained stable on water seal, leave in place  3. Oral Thrush- continue magic mouthwash 4. RUE  Cellulitis/Phlebtitis- on Keflex, improving 5. Lovenox for DVT prophylaxis 6. Dispo- patient stable, maintaining NSR, chest tube with persistent air leak, continue magic mouthwash, Keflex, repeat CXR in AM  LOS: 8 days    Ellwood Handler 03/07/2019

## 2019-03-08 ENCOUNTER — Inpatient Hospital Stay (HOSPITAL_COMMUNITY): Payer: BC Managed Care – PPO

## 2019-03-08 LAB — BASIC METABOLIC PANEL
Anion gap: 11 (ref 5–15)
BUN: 9 mg/dL (ref 8–23)
CO2: 32 mmol/L (ref 22–32)
Calcium: 9.7 mg/dL (ref 8.9–10.3)
Chloride: 96 mmol/L — ABNORMAL LOW (ref 98–111)
Creatinine, Ser: 0.7 mg/dL (ref 0.44–1.00)
GFR calc Af Amer: >60 mL/min (ref >60)
GFR calc non Af Amer: >60 mL/min (ref >60)
Glucose, Bld: 115 mg/dL — ABNORMAL HIGH (ref 70–99)
Potassium: 4.1 mmol/L (ref 3.5–5.1)
Sodium: 139 mmol/L (ref 135–145)

## 2019-03-08 MED ORDER — SERTRALINE HCL 100 MG PO TABS
100.0000 mg | ORAL_TABLET | Freq: Every day | ORAL | Status: DC
  Administered 2019-03-08 – 2019-03-11 (×4): 100 mg via ORAL
  Filled 2019-03-08 (×4): qty 1

## 2019-03-08 NOTE — Progress Notes (Addendum)
[address]       [place] [address]             [phone]      9 Days Post-Op Procedure(s) (LRB): VIDEO ASSISTED THORACOSCOPY (Right) RIGHT UPPER WEDGE AND RIGHT LOWER SEGMENTECTOMY LYMPH NODE DISSECTION (Right) Subjective: Frustrated with ongoing air leak , c/o constipation , only mild SOB  Objective: Vital signs in last 24 hours: Temp:  [97.8 F (36.6 C)-98.3 F (36.8 C)] 97.8 F (36.6 C) (05/23 0715) Pulse Rate:  [63-79] 68 (05/23 0715) Cardiac Rhythm: Normal sinus rhythm (05/23 0700) Resp:  [12-17] 16 (05/23 0425) BP: (80-104)/(42-55) 98/50 (05/23 0715) SpO2:  [98 %-100 %] 99 % (05/23 0715)  Hemodynamic parameters for last 24 hours:    Intake/Output from previous day: 05/22 0701 - 05/23 0700 In: -  Out: 30 [Chest Tube:30] Intake/Output this shift: No intake/output data recorded.  General appearance: alert, cooperative and no distress Heart: regular rate and rhythm Lungs: mildly dim in right base Abdomen: benign Extremities: no edema or calf tenderness Wound: incis healing well  Lab Results: No results for input(s): WBC, HGB, HCT, PLT in the last 72 hours. BMET:  Recent Labs    03/08/19 0314  NA 139  K 4.1  CL 96*  CO2 32  GLUCOSE 115*  BUN 9  CREATININE 0.70  CALCIUM 9.7    PT/INR: No results for input(s): LABPROT, INR in the last 72 hours. ABG    Component Value Date/Time   PHART 7.368 02/28/2019 0315   HCO3 26.0 02/28/2019 0315   TCO2 32 02/27/2019 0857   O2SAT 98.8 02/28/2019 0315   CBG (last 3)  No results for input(s): GLUCAP in the last 72 hours.  Meds Scheduled Meds: . amiodarone  200 mg Oral BID  . bisacodyl  10 mg Oral Daily  . cephALEXin  500 mg Oral BID  . enoxaparin (LOVENOX) injection  40 mg Subcutaneous Q24H  . feeding supplement (PRO-STAT SUGAR FREE 64)  30 mL Oral BID  . mouth rinse  15 mL Mouth Rinse BID  . metoprolol tartrate  12.5 mg Oral BID  . multivitamin with minerals  1 tablet Oral  Daily  . senna-docusate  1 tablet Oral QHS  . sertraline  100 mg Oral Q1500   Continuous Infusions: . potassium chloride     PRN Meds:.dicyclomine, levalbuterol, nicotine, ondansetron (ZOFRAN) IV, ondansetron, oxyCODONE, oxymetazoline, potassium chloride, sodium chloride, traMADol  Xrays Dg Chest Port 1 View  Result Date: 03/08/2019 CLINICAL DATA:  63 year old female with history of wedge resection in the right lung. EXAM: PORTABLE CHEST 1 VIEW COMPARISON:  Chest x-ray 03/07/2019. FINDINGS: Right-sided chest tube remains stable in position with tip in the medial aspect of the upper right hemithorax. Small right apical pneumothorax occupying less than 10% of the volume of the right hemithorax. Postoperative changes of wedge resection in the right upper lobe. No acute consolidative airspace disease. No pleural effusions. No evidence of pulmonary edema. Heart size is normal. Mediastinal contours are unremarkable. Extensive subcutaneous emphysema in the right chest wall, lower right cervical region and extending into the right upper extremity. IMPRESSION: 1. Support apparatus and postoperative changes, as above. Small right apical pneumothorax occupying less than 10% of the volume of the right hemithorax. 2. Persistent extensive subcutaneous emphysema in the right hemithorax, as above. Electronically Signed   By: Vinnie Langton M.D.   On: 03/08/2019 08:17   Dg Chest Port 1 View  Result Date:  03/07/2019 CLINICAL DATA:  Partial lobectomy with chest tube EXAM: PORTABLE CHEST 1 VIEW COMPARISON:  Yesterday FINDINGS: Chest tube on the right with tip towards the apex. Accounting for soft tissue emphysema there is no definite pneumothorax. Atelectasis/pleural fluid at the right base. The left lung is clear. Normal heart size. IMPRESSION: 1. No visible pneumothorax when accounting for overlapping soft tissue emphysema. 2. Stable atelectasis. Electronically Signed   By: Monte Fantasia M.D.   On: 03/07/2019  07:29    Assessment/Plan: S/P Procedure(s) (LRB): VIDEO ASSISTED THORACOSCOPY (Right) RIGHT UPPER WEDGE AND RIGHT LOWER SEGMENTECTOMY LYMPH NODE DISSECTION (Right)  1 hemodyn stable, maintaining sinus rhythm, SBP runs rel low90's -Low 100's 2 sats good on RA 3 air leak persists, small pntx, + extensive sq air, will place to 10 cm H2O suction. Min output, serous 4 renal fxn stable 5 phlebitis is stable, cont keflex 6 thrush improved with magic mouthwash  LOS: 9 days    [facility] 03/08/2019 Pager [phone]  I have seen and examined the patient and agree with the assessment and plan as outlined.  Subcutaneous air reportedly increased - I am not impressed by physical exam.  Will replace tube to suction for now but hopefully replace to water seal again w/in 24-48 hours  Rexene Alberts, MD 03/08/2019 10:17 AM

## 2019-03-08 NOTE — Plan of Care (Signed)
  Problem: Health Behavior/Discharge Planning: Goal: Ability to manage health-related needs will improve Outcome: Progressing   Problem: Clinical Measurements: Goal: Ability to maintain clinical measurements within normal limits will improve Outcome: Progressing Goal: Will remain free from infection Outcome: Progressing Goal: Diagnostic test results will improve Outcome: Progressing Goal: Respiratory complications will improve Outcome: Progressing Goal: Cardiovascular complication will be avoided Outcome: Progressing   Problem: Activity: Goal: Risk for activity intolerance will decrease Outcome: Progressing   Problem: Nutrition: Goal: Adequate nutrition will be maintained Outcome: Progressing   Problem: Coping: Goal: Level of anxiety will decrease Outcome: Progressing   Problem: Elimination: Goal: Will not experience complications related to bowel motility Outcome: Progressing Goal: Will not experience complications related to urinary retention Outcome: Progressing   Problem: Pain Managment: Goal: General experience of comfort will improve Outcome: Progressing   Problem: Safety: Goal: Ability to remain free from injury will improve Outcome: Progressing   Problem: Skin Integrity: Goal: Risk for impaired skin integrity will decrease Outcome: Progressing   Problem: Education: Goal: Knowledge of disease or condition will improve Outcome: Progressing Goal: Knowledge of the prescribed therapeutic regimen will improve Outcome: Progressing   Problem: Activity: Goal: Risk for activity intolerance will decrease Outcome: Progressing   Problem: Cardiac: Goal: Will achieve and/or maintain hemodynamic stability Outcome: Progressing   Problem: Clinical Measurements: Goal: Postoperative complications will be avoided or minimized Outcome: Progressing   Problem: Respiratory: Goal: Respiratory status will improve Outcome: Progressing   Problem: Pain Management: Goal:  Pain level will decrease Outcome: Progressing   Problem: Skin Integrity: Goal: Wound healing without signs and symptoms infection will improve Outcome: Progressing

## 2019-03-09 ENCOUNTER — Inpatient Hospital Stay (HOSPITAL_COMMUNITY): Payer: BC Managed Care – PPO

## 2019-03-09 MED ORDER — SENNOSIDES-DOCUSATE SODIUM 8.6-50 MG PO TABS
1.0000 | ORAL_TABLET | Freq: Every evening | ORAL | Status: DC | PRN
  Filled 2019-03-09: qty 1

## 2019-03-09 MED ORDER — BISACODYL 5 MG PO TBEC
10.0000 mg | DELAYED_RELEASE_TABLET | Freq: Every day | ORAL | Status: DC | PRN
  Filled 2019-03-09: qty 2

## 2019-03-09 NOTE — Progress Notes (Addendum)
[address]       [place] [address]             [phone]      10 Days Post-Op Procedure(s) (LRB): VIDEO ASSISTED THORACOSCOPY (Right) RIGHT UPPER WEDGE AND RIGHT LOWER SEGMENTECTOMY LYMPH NODE DISSECTION (Right) Subjective: Subjectively feels a little better with some minor discomforts  Objective: Vital signs in last 24 hours: Temp:  [97.6 F (36.4 C)-98.1 F (36.7 C)] 97.7 F (36.5 C) (05/24 0731) Pulse Rate:  [60-71] 62 (05/24 0731) Cardiac Rhythm: Normal sinus rhythm (05/23 2000) Resp:  [15-16] 16 (05/24 0437) BP: (104-118)/(58-70) 114/66 (05/24 0731) SpO2:  [97 %-100 %] 100 % (05/24 0731)  Hemodynamic parameters for last 24 hours:    Intake/Output from previous day: 05/23 0701 - 05/24 0700 In: 657 [P.O.:657] Out: 220 [Chest Tube:220] Intake/Output this shift: No intake/output data recorded.  General appearance: alert, cooperative and no distress Heart: regular rate and rhythm Lungs: clear to auscultation bilaterally Abdomen: benign Extremities: no edema or calf tenderness Wound: incis healing well  Lab Results: No results for input(s): WBC, HGB, HCT, PLT in the last 72 hours. BMET:  Recent Labs    03/08/19 0314  NA 139  K 4.1  CL 96*  CO2 32  GLUCOSE 115*  BUN 9  CREATININE 0.70  CALCIUM 9.7    PT/INR: No results for input(s): LABPROT, INR in the last 72 hours. ABG    Component Value Date/Time   PHART 7.368 02/28/2019 0315   HCO3 26.0 02/28/2019 0315   TCO2 32 02/27/2019 0857   O2SAT 98.8 02/28/2019 0315   CBG (last 3)  No results for input(s): GLUCAP in the last 72 hours.  Meds Scheduled Meds: . amiodarone  200 mg Oral BID  . bisacodyl  10 mg Oral Daily  . cephALEXin  500 mg Oral BID  . enoxaparin (LOVENOX) injection  40 mg Subcutaneous Q24H  . feeding supplement (PRO-STAT SUGAR FREE 64)  30 mL Oral BID  . mouth rinse  15 mL Mouth Rinse BID  . metoprolol tartrate  12.5 mg Oral BID  . multivitamin with  minerals  1 tablet Oral Daily  . senna-docusate  1 tablet Oral QHS  . sertraline  100 mg Oral Q1500   Continuous Infusions: . potassium chloride     PRN Meds:.dicyclomine, levalbuterol, nicotine, ondansetron (ZOFRAN) IV, ondansetron, oxyCODONE, oxymetazoline, potassium chloride, sodium chloride, traMADol  Xrays Dg Chest Port 1 View  Result Date: 03/09/2019 CLINICAL DATA:  63 year old female with history of chest tube. EXAM: PORTABLE CHEST 1 VIEW COMPARISON:  Chest x-ray 03/08/2019. FINDINGS: Suture lines in the right upper lobe from prior wedge resection. Right-sided chest tube is stable in position with tip in the medial aspect of the upper right hemithorax. Previously noted right apical pneumothorax is no longer confidently identified. No acute consolidative airspace disease. No pleural effusions. No evidence of pulmonary edema. Heart size is normal. Upper mediastinal contours are within normal limits. Extensive subcutaneous emphysema along the right chest wall extending to the right axillary region, right upper extremity and lower right cervical region. IMPRESSION: 1. Stable position of right-sided chest tube with resolution of previously noted right apical pneumothorax. Electronically Signed   By: Vinnie Langton M.D.   On: 03/09/2019 08:18   Dg Chest Port 1 View  Result Date: 03/08/2019 CLINICAL DATA:  63 year old female with history of wedge resection in the right lung. EXAM: PORTABLE CHEST 1 VIEW COMPARISON:  Chest x-ray 03/07/2019.  FINDINGS: Right-sided chest tube remains stable in position with tip in the medial aspect of the upper right hemithorax. Small right apical pneumothorax occupying less than 10% of the volume of the right hemithorax. Postoperative changes of wedge resection in the right upper lobe. No acute consolidative airspace disease. No pleural effusions. No evidence of pulmonary edema. Heart size is normal. Mediastinal contours are unremarkable. Extensive subcutaneous  emphysema in the right chest wall, lower right cervical region and extending into the right upper extremity. IMPRESSION: 1. Support apparatus and postoperative changes, as above. Small right apical pneumothorax occupying less than 10% of the volume of the right hemithorax. 2. Persistent extensive subcutaneous emphysema in the right hemithorax, as above. Electronically Signed   By: Vinnie Langton M.D.   On: 03/08/2019 08:17    Assessment/Plan: S/P Procedure(s) (LRB): VIDEO ASSISTED THORACOSCOPY (Right) RIGHT UPPER WEDGE AND RIGHT LOWER SEGMENTECTOMY LYMPH NODE DISSECTION (Right)  1 remains stable with air leak , CXR-  pntx resolved, will leave CT to 10 cm suction for now. Sats excellent on RA 2 no new labs 3 phlebitis slowly improving 4 cont routine activities and pulm toilet  LOS: 10 days    John Giovanni [facility] 03/09/2019 Pager [phone]  I have seen and examined the patient and agree with the assessment and plan as outlined.  Rexene Alberts, MD 03/09/2019 10:25 AM

## 2019-03-10 NOTE — Progress Notes (Addendum)
[address]       [place] [address]             [phone]      11 Days Post-Op Procedure(s) (LRB): VIDEO ASSISTED THORACOSCOPY (Right) RIGHT UPPER WEDGE AND RIGHT LOWER SEGMENTECTOMY LYMPH NODE DISSECTION (Right) Subjective: conts with air leak which appears stable  Objective: Vital signs in last 24 hours: Temp:  [97.5 F (36.4 C)-98 F (36.7 C)] 97.9 F (36.6 C) (05/25 0820) Pulse Rate:  [61-68] 67 (05/25 0923) Cardiac Rhythm: Normal sinus rhythm (05/25 0820) Resp:  [12-18] 18 (05/25 0358) BP: (97-115)/(59-67) 97/63 (05/25 0923) SpO2:  [96 %-100 %] 96 % (05/25 0820)  Hemodynamic parameters for last 24 hours:    Intake/Output from previous day: 05/24 0701 - 05/25 0700 In: 750 [P.O.:750] Out: 40 [Chest Tube:40] Intake/Output this shift: No intake/output data recorded.  General appearance: alert, cooperative and no distress Heart: regular rate and rhythm Lungs: clear to auscultation bilaterally Abdomen: benign Extremities: no edema or calf tenderness Wound: healing well  Lab Results: No results for input(s): WBC, HGB, HCT, PLT in the last 72 hours. BMET:  Recent Labs    03/08/19 0314  NA 139  K 4.1  CL 96*  CO2 32  GLUCOSE 115*  BUN 9  CREATININE 0.70  CALCIUM 9.7    PT/INR: No results for input(s): LABPROT, INR in the last 72 hours. ABG    Component Value Date/Time   PHART 7.368 02/28/2019 0315   HCO3 26.0 02/28/2019 0315   TCO2 32 02/27/2019 0857   O2SAT 98.8 02/28/2019 0315   CBG (last 3)  No results for input(s): GLUCAP in the last 72 hours.  Meds Scheduled Meds: . amiodarone  200 mg Oral BID  . cephALEXin  500 mg Oral BID  . enoxaparin (LOVENOX) injection  40 mg Subcutaneous Q24H  . feeding supplement (PRO-STAT SUGAR FREE 64)  30 mL Oral BID  . mouth rinse  15 mL Mouth Rinse BID  . metoprolol tartrate  12.5 mg Oral BID  . multivitamin with minerals  1 tablet Oral Daily  . sertraline  100 mg Oral Q1500    Continuous Infusions: . potassium chloride     PRN Meds:.bisacodyl, dicyclomine, levalbuterol, nicotine, ondansetron (ZOFRAN) IV, ondansetron, oxyCODONE, oxymetazoline, potassium chloride, senna-docusate, sodium chloride, traMADol  Xrays Dg Chest Port 1 View  Result Date: 03/09/2019 CLINICAL DATA:  63 year old female with history of chest tube. EXAM: PORTABLE CHEST 1 VIEW COMPARISON:  Chest x-ray 03/08/2019. FINDINGS: Suture lines in the right upper lobe from prior wedge resection. Right-sided chest tube is stable in position with tip in the medial aspect of the upper right hemithorax. Previously noted right apical pneumothorax is no longer confidently identified. No acute consolidative airspace disease. No pleural effusions. No evidence of pulmonary edema. Heart size is normal. Upper mediastinal contours are within normal limits. Extensive subcutaneous emphysema along the right chest wall extending to the right axillary region, right upper extremity and lower right cervical region. IMPRESSION: 1. Stable position of right-sided chest tube with resolution of previously noted right apical pneumothorax. Electronically Signed   By: Vinnie Langton M.D.   On: 03/09/2019 08:18    Assessment/Plan: S/P Procedure(s) (LRB): VIDEO ASSISTED THORACOSCOPY (Right) RIGHT UPPER WEDGE AND RIGHT LOWER SEGMENTECTOMY LYMPH NODE DISSECTION (Right)  1 stable hemodynamically in sinus rhythm 2 sats good on RA 3 will place back to H2O seal and get PA/Lat CXR in am   LOS: 11 days  Gaspar Bidding 03/10/2019 Pager [phone]  I have seen and examined the patient and agree with the assessment and plan as outlined.  Try placing tube to H2O seal.  CXR in am.  Possible d/c with mini-Express if stable off suction.  Rexene Alberts, MD 03/10/2019 10:01 AM

## 2019-03-11 ENCOUNTER — Inpatient Hospital Stay (HOSPITAL_COMMUNITY): Payer: BC Managed Care – PPO

## 2019-03-11 MED ORDER — ACETAMINOPHEN 325 MG PO TABS
650.0000 mg | ORAL_TABLET | Freq: Four times a day (QID) | ORAL | Status: DC | PRN
  Administered 2019-03-11: 650 mg via ORAL
  Filled 2019-03-11 (×2): qty 2

## 2019-03-11 NOTE — TOC Progression Note (Signed)
Transition of Care [facility]) - Progression Note    Patient Details  Name: Madeline Gates MRN: [id number] Date of Birth: 07/15/1956  Transition of [facility]) CM/SW Contact  Ella Bodo, RN Phone Number: 03/11/2019, 3:49 PM  Clinical Narrative: Pt s/p Rt VATS on 02/27/19; chest tube with persistent air leak and failed H20 seal.  Pt to dc home with [facility] chest tube; she will need HHRN to manage, and pt is agreeable to [facility] services.   Referral to [facility] for [facility] follow up; start of care 24-48h post dc date.  Pt states she will dc home with husband and son to assist as needed.  She denies any additional home needs.       Expected Discharge Plan: [place] Barriers to Discharge: Continued Medical Work up  Expected Discharge Plan and Services Expected Discharge Plan: [place]   Discharge Planning Services: CM Consult Post Acute Care Choice: [place] arrangements for the past 2 months: [place]: RN [facility] Agency: [place] Date [place]: 03/11/19 Time [place]: 1548 Representative spoke with at [place]: Adela Lank       Readmission Risk Interventions No flowsheet data found.  Reinaldo Raddle, RN, BSN  Trauma/Neuro ICU Case Manager [phone]

## 2019-03-11 NOTE — Discharge Instructions (Signed)
[facility]  1. If you develop worsening onset shortness of breath and/or chest pain, please contact our office or report to the nearest Emergency Department 2. If you develop worsening right sided swelling, please contact our office of report to the nearest Emergency Department   Video-Assisted Thoracic Surgery, Care After This sheet gives you information about how to care for yourself after your procedure. Your health care provider may also give you more specific instructions. If you have problems or questions, contact your health care provider. What can I expect after the procedure? After the procedure, it is common to have:  Some pain and soreness in your chest.  Pain when breathing in (inhaling) and coughing.  Constipation.  Fatigue.  Difficulty sleeping. Follow these instructions at home: Preventing pneumonia  Take deep breaths or do breathing exercises as instructed by your health care provider. Doing this helps prevent lung infection (pneumonia).  Cough frequently. Coughing may cause discomfort, but it is important to clear mucus (phlegm) and expand your lungs. If it hurts to cough, hold a pillow against your chest or place the palms of both hands on top of the incision (use splinting) when you cough. This may help relieve discomfort.  If you were given an incentive spirometer, use it as directed. An incentive spirometer is a tool that measures how well you are filling your lungs with each breath.  Participate in pulmonary rehabilitation as directed by your health care provider. This is a program that combines education, exercise, and support from a team of specialists. The goal is to help you heal and get back to your normal activities as soon as possible. Medicines  Take over-the-counter or prescription medicines only as told by your health care provider.  If you have pain, take pain-relieving medicine before your pain becomes severe. This is important because if  your pain is under control, you will be able to breathe and cough more comfortably.  If you were prescribed an antibiotic medicine, take it as told by your health care provider. Do not stop taking the antibiotic even if you start to feel better. Activity  Ask your health care provider what activities are safe for you.  Avoid activities that use your chest muscles for at least 3-4 weeks.  Do not lift anything that is heavier than 10 lb (4.5 kg), or the limit that your health care provider tells you, until he or she says that it is safe. Incision care  Follow instructions from your health care provider about how to take care of your incision(s). Make sure you: ? Wash your hands with soap and water before you change your bandage (dressing). If soap and water are not available, use hand sanitizer. ? Change your dressing as told by your health care provider. ? Leave stitches (sutures), skin glue, or adhesive strips in place. These skin closures may need to stay in place for 2 weeks or longer. If adhesive strip edges start to loosen and curl up, you may trim the loose edges. Do not remove adhesive strips completely unless your health care provider tells you to do that.  Keep your dressing dry until it has been removed.  Check your incision area every day for signs of infection. Check for: ? Redness, swelling, or pain. ? Fluid or blood. ? Warmth. ? Pus or a bad smell. Bathing  Do not take baths, swim, or use a hot tub until your health care provider approves. You may take showers.  After your dressing has  been removed, use soap and water to gently wash your incision area. Do not use anything else to clean your incision(s) unless your health care provider tells you to do this. Driving   Do not drive until your health care provider approves.  Do not drive or use heavy machinery while taking prescription pain medicine. Eating and drinking  Eat a healthy, balanced diet as instructed by your  health care provider. A healthy diet includes plenty of fresh fruits and vegetables, whole grains, and low-fat (lean) proteins.  Limit foods that are high in fat and processed sugars, such as fried and sweet foods.  Drink enough fluid to keep your urine clear or pale yellow. General instructions   To prevent or treat constipation while you are taking prescription pain medicine, your health care provider may recommend that you: ? Take over-the-counter or prescription medicines. ? Eat foods that are high in fiber, such as beans, fresh fruits and vegetables, and whole grains.  Do not use any products that contain nicotine or tobacco, such as cigarettes and e-cigarettes. If you need help quitting, ask your health care provider.  Avoid secondhand smoke.  Wear compression stockings as told by your health care provider. These stockings help to prevent blood clots and reduce swelling in your legs.  If you have a chest tube, care for it as instructed by your health care provider. Do not travel by airplane during the 2 weeks after your chest tube is removed, or until your health care provider says that this is safe.  Keep all follow-up visits as told by your health care provider. This is important. Contact a health care provider if:  You have redness, swelling, or pain around an incision.  You have fluid or blood coming from an incision.  Your incision area feels warm to the touch.  You have pus or a bad smell coming from an incision.  You have a fever or chills.  You have nausea or vomiting.  You have pain that does not get better with medicine. Get help right away if:  You have chest pain.  Your heart is fluttering or beating rapidly.  You develop a rash.  You have shortness of breath or trouble breathing.  You are confused.  You have trouble speaking.  You feel weak, light-headed, or dizzy.  You faint. Summary  To help prevent lung infection (pneumonia), take deep  breaths or do breathing exercises as instructed by your health care provider.  Cough frequently to clear mucus (phlegm) and expand your lungs. If it hurts to cough, hold a pillow against your chest or place the palms of both hands on top of the incision (use splinting) when you cough.  If you have pain, take pain-relieving medicine before your pain becomes severe. This is important because if your pain is under control, you will be able to breathe and cough more comfortably.  Ask your health care provider what activities are safe for you. This information is not intended to replace advice given to you by your health care provider. Make sure you discuss any questions you have with your health care provider. Document Released: 01/27/2013 Document Revised: 09/11/2016 Document Reviewed: 09/11/2016 Elsevier Interactive Patient Education  2019 [employer].

## 2019-03-11 NOTE — Progress Notes (Signed)
pleurac- drain changed to minipress. And pt educated.care continues.

## 2019-03-11 NOTE — Progress Notes (Addendum)
      [address]       [place] [address]             [phone]      12 Days Post-Op Procedure(s) (LRB): VIDEO ASSISTED THORACOSCOPY (Right) RIGHT UPPER WEDGE AND RIGHT LOWER SEGMENTECTOMY LYMPH NODE DISSECTION (Right)   Subjective:  No new complaints.  States needs to get rid of chest tube and get on with her life.    Objective: Vital signs in last 24 hours: Temp:  [97.6 F (36.4 C)-98.1 F (36.7 C)] 97.7 F (36.5 C) (05/26 0410) Pulse Rate:  [64-86] 86 (05/26 0410) Cardiac Rhythm: Normal sinus rhythm (05/25 2100) Resp:  [14-18] 18 (05/26 0410) BP: (92-112)/(61-76) 111/73 (05/26 0410) SpO2:  [96 %-99 %] 98 % (05/26 0410)  Intake/Output from previous day: 05/25 0701 - 05/26 0700 In: -  Out: 40 [Chest Tube:40]  General appearance: alert, cooperative and no distress Heart: regular rate and rhythm Lungs: clear to auscultation bilaterally Abdomen: soft, non-tender; bowel sounds normal; no masses,  no organomegaly Extremities: extremities normal, atraumatic, no cyanosis or edema Wound: clean and dry  Lab Results: No results for input(s): WBC, HGB, HCT, PLT in the last 72 hours. BMET: No results for input(s): NA, K, CL, CO2, GLUCOSE, BUN, CREATININE, CALCIUM in the last 72 hours.  PT/INR: No results for input(s): LABPROT, INR in the last 72 hours. ABG    Component Value Date/Time   PHART 7.368 02/28/2019 0315   HCO3 26.0 02/28/2019 0315   TCO2 32 02/27/2019 0857   O2SAT 98.8 02/28/2019 0315   CBG (last 3)  No results for input(s): GLUCAP in the last 72 hours.  Assessment/Plan: S/P Procedure(s) (LRB): VIDEO ASSISTED THORACOSCOPY (Right) RIGHT UPPER WEDGE AND RIGHT LOWER SEGMENTECTOMY LYMPH NODE DISSECTION (Right)  1. CV- NSR, + hypotension- nursing held lopressor overnight, on Amiodarone, lopressor as BP allows 2. Pulm- failed trial of water seal over the weekend, she developed an increase in her pneumothorax and increase in sub q air along  her left chest... placed back on suction Saturday, she was again placed to water seal yesterday 5/26.Marland Kitchen continues to have air leak, CXR with pneumothorax and sub q air 3. Dispo- patient has failed water seal trial twice, now back on water seal for 3rd attempt with persistent air leak, pneumothorax is increased in size from last week, but remained stable from yesterday... patient will likely require re-exploration vs. Transition to [facility] if she can tolerate, repeat cxr in AM   LOS: 12 days    [place] Handler 03/11/2019 Patient seen and examined, agree with above Her air leak is dramatically smaller than it was last week. Minimal at the worst. Small amount of air with initial cough but no ongoing leak with multiple repeated coughs over the next 10 minutes. Might get away with pulling tube today, but given prolonged week, will hold off for now. Will place to min[facility]xpress. If no leak in AM- dc chest tube. If there is a leak will dc home tomorrow and remove tube in office next week  Remo Lipps C. Roxan Hockey, MD [facility] [phone]

## 2019-03-12 ENCOUNTER — Inpatient Hospital Stay (HOSPITAL_COMMUNITY): Payer: BC Managed Care – PPO

## 2019-03-12 MED ORDER — AMIODARONE HCL 200 MG PO TABS: 200.0000 mg | ORAL_TABLET | Freq: Every day | ORAL | 1 refills | Status: DC

## 2019-03-12 MED ORDER — OXYCODONE HCL 5 MG PO TABS: 5.0000 mg | ORAL_TABLET | ORAL | 0 refills | Status: DC | PRN

## 2019-03-12 NOTE — Progress Notes (Addendum)
      [address]       [place] [address]             [phone]       13 Days Post-Op Procedure(s) (LRB): VIDEO ASSISTED THORACOSCOPY (Right) RIGHT UPPER WEDGE AND RIGHT LOWER SEGMENTECTOMY LYMPH NODE DISSECTION (Right)  Subjective: Patient without complaints and really wants to go home.  Objective: Vital signs in last 24 hours: Temp:  [97.5 F (36.4 C)-98.3 F (36.8 C)] 98.1 F (36.7 C) (05/27 0721) Pulse Rate:  [60-73] 65 (05/27 0721) Cardiac Rhythm: Normal sinus rhythm (05/26 2130) Resp:  [15-26] 15 (05/27 0356) BP: (91-114)/(57-72) 109/61 (05/27 0721) SpO2:  [94 %-100 %] 99 % (05/27 0721)     Intake/Output from previous day: 05/26 0701 - 05/27 0700 In: 25 [P.O.:460] Out: 10 [Chest Tube:10]   Physical Exam:  Cardiovascular: RRR Pulmonary: Coarse on the right and clear on the left Abdomen: Soft, non tender, bowel sounds present. Extremities: No lower extremity edema. Wounds: Clean and dry.  No erythema or signs of infection. Chest Tube: to mini express, air leak  Lab Results: CBC:No results for input(s): WBC, HGB, HCT, PLT in the last 72 hours. BMET: No results for input(s): NA, K, CL, CO2, GLUCOSE, BUN, CREATININE, CALCIUM in the last 72 hours.  PT/INR: No results for input(s): LABPROT, INR in the last 72 hours. ABG:  INR: Will add last result for INR, ABG once components are confirmed Will add last 4 CBG results once components are confirmed  Assessment/Plan:  1. CV - Previous atrial fibrillation. She has been maintaining SR for days. Of note, she is now always receiving Lopressor as HR and BP at times will not allow. Will decrease Amiodarone to 200 mg daily and stop Lopressor. 2.  Pulmonary - On room air. Chest tube to mini express,  air leak,and with scant output (10 cc). CXR this am appears to show decrease in right apical pneumothorax and ? resolution of right basilar pneumothorax, right lateral chest wall subcutaneous emphysema  is stable. Encourage incentive spirometer 3. Await final CXR interpretation but  discharge later today with ct to mini express and HH.  Alannis Hsia M ZimmermanPA-C 03/12/2019,7:26 AM [phone]

## 2019-03-12 NOTE — TOC Transition Note (Signed)
Transition of Care [facility]) - CM/SW Discharge Note   Patient Details  Name: Madeline Gates MRN: [id number] Date of Birth: 01/27/1956  Transition [facility]) CM/SW Contact:  Bartholomew Crews, RN Phone Number: [phone] 03/12/2019, 9:45 AM   Clinical Narrative:    Patient to transition home today. Notified [place]. No other transition of care needs identified.    Final next level of care: [place] Barriers to Discharge: No Barriers Identified   Patient Goals and CMS Choice Patient states their goals for this hospitalization and ongoing recovery are:: to get back home CMS Medicare.gov Compare Post Acute Care list provided to:: Patient Choice offered to / list presented to : Patient  Discharge Placement                       Discharge Plan and Services   Discharge Planning Services: CM Consult Post Acute Care Choice: Home Health          DME Arranged: N/A DME Agency: NA       HH Arranged: RN [place] Agency: [place] Date [facility] Agency Contacted: 03/12/19 Time [place]: 0945 Representative spoke with at [place]: Adela Lank  Social Determinants of Health (SDOH) Interventions     Readmission Risk Interventions No flowsheet data found.

## 2019-03-13 ENCOUNTER — Other Ambulatory Visit: Payer: Self-pay | Admitting: Thoracic Surgery (Cardiothoracic Vascular Surgery)

## 2019-03-13 DIAGNOSIS — R918 Other nonspecific abnormal finding of lung field: Secondary | ICD-10-CM

## 2019-03-17 ENCOUNTER — Ambulatory Visit: Payer: BLUE CROSS/BLUE SHIELD | Admitting: Thoracic Surgery (Cardiothoracic Vascular Surgery)

## 2019-03-17 ENCOUNTER — Ambulatory Visit (HOSPITAL_COMMUNITY): Payer: BLUE CROSS/BLUE SHIELD | Admitting: Hematology

## 2019-03-18 ENCOUNTER — Other Ambulatory Visit: Payer: Self-pay

## 2019-03-18 ENCOUNTER — Encounter: Payer: BLUE CROSS/BLUE SHIELD | Admitting: Thoracic Surgery (Cardiothoracic Vascular Surgery)

## 2019-03-19 ENCOUNTER — Other Ambulatory Visit: Payer: Self-pay | Admitting: Thoracic Surgery (Cardiothoracic Vascular Surgery)

## 2019-03-19 ENCOUNTER — Ambulatory Visit
Admission: RE | Admit: 2019-03-19 | Discharge: 2019-03-19 | Disposition: A | Discharge status: Home / Self Care | Payer: BC Managed Care – PPO | Source: Ambulatory Visit | Attending: Thoracic Surgery (Cardiothoracic Vascular Surgery) | Admitting: Thoracic Surgery (Cardiothoracic Vascular Surgery)

## 2019-03-19 ENCOUNTER — Ambulatory Visit (INDEPENDENT_AMBULATORY_CARE_PROVIDER_SITE_OTHER): Payer: Self-pay | Admitting: Thoracic Surgery (Cardiothoracic Vascular Surgery)

## 2019-03-19 VITALS — BP 125/68 | HR 79 | Temp 97.5°F | Resp 20 | Ht 65.0 in | Wt 104.0 lb

## 2019-03-19 DIAGNOSIS — R918 Other nonspecific abnormal finding of lung field: Secondary | ICD-10-CM

## 2019-03-19 DIAGNOSIS — Z09 Encounter for follow-up examination after completed treatment for conditions other than malignant neoplasm: Secondary | ICD-10-CM

## 2019-03-19 DIAGNOSIS — Z4682 Encounter for fitting and adjustment of non-vascular catheter: Secondary | ICD-10-CM

## 2019-03-19 MED ORDER — OXYCODONE HCL 5 MG PO TABS: 5.0000 mg | ORAL_TABLET | Freq: Four times a day (QID) | ORAL | 0 refills | Status: DC | PRN

## 2019-03-19 NOTE — Progress Notes (Signed)
[address]       [place] [address]             [phone]       HPI: Mrs. Madeline Gates returns for a scheduled follow-up visit  Madeline Gates is a 63 year old woman with a history of tobacco abuse, COPD, irritable bowel syndrome, and anxiety.  She had a CT of the abdomen and pelvis to evaluate for abdominal cramps.  She was found to have a right lower lobe lung nodule.  A CT of the chest was done which showed a 3.6 x 3.3 cm right apical mass and a 3.1 x 2.3 cm cavitary spiculated mass in the right lower lobe.  On PET CT these lesions were both hypermetabolic.  There was no evidence of regional or distant metastases.  MRI of the brain showed no metastases.  A thoracoscopic right lower lobe segmentectomy and upper lobe wedge resection along with a lymph node dissection on 02/27/2019.  Postoperatively, she had atrial fibrillation.  She converted to sinus rhythm with amiodarone.  She also had a prolonged air leak.  The air leak had nearly resolved at the time of discharge but she did go home with her chest tube on 03/12/2019.  She says she had a lot of pain initially after she first got home.  She was having trouble with the chest tube getting caught on things.  She was using oxycodone quite frequently than but has run out of it.  She says her pain is significantly better.  She has not noticed any bubbles coming through the tubing since discharge.  Past Medical History:  Diagnosis Date  . Anxiety   . IBS (irritable bowel syndrome)   . Multiple lung nodules     Current Outpatient Medications  Medication Sig Dispense Refill  . acetaminophen (TYLENOL) 500 MG tablet Take 500 mg by mouth 2 (two) times daily as needed for headache.    Marland Kitchen amiodarone (PACERONE) 200 MG tablet Take 1 tablet (200 mg total) by mouth daily. 30 tablet 1  . Calcium Carb-Cholecalciferol (CALCIUM 600+D3) 600-800 MG-UNIT TABS Take 1 tablet by mouth daily.    Marland Kitchen doxylamine, Sleep, (UNISOM) 25 MG tablet Take  12.5 mg by mouth at bedtime as needed for sleep.    . Multiple Vitamin (MULTIVITAMIN WITH MINERALS) TABS tablet Take 1 tablet by mouth 2 (two) times a week.    Marland Kitchen oxymetazoline (AFRIN) 0.05 % nasal spray Place 1 spray into both nostrils 2 (two) times daily as needed for congestion.    . sertraline (ZOLOFT) 100 MG tablet Take 100 mg by mouth daily at 3 pm.     . sodium chloride (OCEAN) 0.65 % SOLN nasal spray Place 1 spray into both nostrils as needed for congestion.    Marland Kitchen oxyCODONE (OXY IR/ROXICODONE) 5 MG immediate release tablet Take 1 tablet (5 mg total) by mouth every 6 (six) hours as needed for severe pain. 20 tablet 0   No current facility-administered medications for this visit.     Physical Exam BP 125/68   Pulse 79   Temp (!) 97.5 F (36.4 C) (Skin)   Resp 20   Ht 5\' 5"  (1.651 m)   Wt 104 lb (47.2 kg)   SpO2 98% Comment: RA  BMI 17.88 kg/m  63 year old woman in no acute distress Alert and oriented x3 with no focal deficits Lungs clear breath sounds bilaterally Cardiac regular rate and rhythm Incision well-healed Chest tube with no air leak,  serous fluid in tubing  Diagnostic Tests: CHEST - 2 VIEW  COMPARISON:  03/12/2019  FINDINGS: Partial right lobectomy. Small right pleural effusion and basilar atelectasis. Right-sided chest tube in satisfactory position. Interval decrease in soft tissue emphysema along the right lateral chest wall. Tiny right apical pneumothorax.  Left lung is clear. Stable cardiomediastinal silhouette. No aggressive osseous lesion.  IMPRESSION: 1. Partial right lobectomy. Small right pleural effusion and basilar atelectasis. Right-sided chest tube in satisfactory position. Tiny right apical pneumothorax.   Electronically Signed   By: Kathreen Devoid   On: 03/19/2019 10:47 I personally reviewed the chest x-ray images and generally concur with the findings noted above, although not sure I see a pneumothorax.  Impression: Madeline Gates  is a 63 year old woman with history of tobacco abuse who underwent right basilar segmentectomy and right upper lobe wedge resection for synchronous adenocarcinomas, pathologic stage T4N0, 3A.  She is doing well currently.  Her pain is well controlled.  She is anxious to resume activities.  Prolonged air leak-she had only questionable air leak when she left the hospital.  She has not seen any bubbles coming through the tubing.  She has no air leak with repeated coughing on exam today.  Will DC chest tube.  Postoperative atrial fibrillation-regular rate and rhythm on exam.  We will continue amiodarone for now.  Consider discontinuing at next visit.  She has an appointment with Dr. Delton Coombes on Monday to discuss adjuvant therapy  Plan: DC chest tube PA and lateral chest x-ray after chest tube removal Follow-up with Dr. Delton Coombes as scheduled Return in 3 weeks with PA and lateral chest x-ray  Madeline Nakayama, MD [facility] [phone]  Procedure Chest tube removed without difficulty.  Patient tolerated well.

## 2019-03-20 ENCOUNTER — Ambulatory Visit: Payer: BLUE CROSS/BLUE SHIELD | Admitting: Thoracic Surgery (Cardiothoracic Vascular Surgery)

## 2019-03-24 ENCOUNTER — Inpatient Hospital Stay (HOSPITAL_COMMUNITY): Payer: BC Managed Care – PPO | Attending: Hematology | Admitting: Hematology

## 2019-03-24 ENCOUNTER — Other Ambulatory Visit: Payer: Self-pay

## 2019-03-24 ENCOUNTER — Encounter (HOSPITAL_COMMUNITY): Payer: Self-pay | Admitting: Hematology

## 2019-03-24 VITALS — BP 110/65 | HR 73 | Temp 98.6°F | Resp 18 | Wt 104.5 lb

## 2019-03-24 DIAGNOSIS — F419 Anxiety disorder, unspecified: Secondary | ICD-10-CM | POA: Diagnosis not present

## 2019-03-24 DIAGNOSIS — Z8049 Family history of malignant neoplasm of other genital organs: Secondary | ICD-10-CM | POA: Insufficient documentation

## 2019-03-24 DIAGNOSIS — Z5111 Encounter for antineoplastic chemotherapy: Secondary | ICD-10-CM | POA: Insufficient documentation

## 2019-03-24 DIAGNOSIS — R918 Other nonspecific abnormal finding of lung field: Secondary | ICD-10-CM

## 2019-03-24 DIAGNOSIS — C3491 Malignant neoplasm of unspecified part of right bronchus or lung: Secondary | ICD-10-CM

## 2019-03-24 DIAGNOSIS — Z801 Family history of malignant neoplasm of trachea, bronchus and lung: Secondary | ICD-10-CM | POA: Insufficient documentation

## 2019-03-24 DIAGNOSIS — C3411 Malignant neoplasm of upper lobe, right bronchus or lung: Secondary | ICD-10-CM | POA: Diagnosis present

## 2019-03-24 DIAGNOSIS — Z87891 Personal history of nicotine dependence: Secondary | ICD-10-CM | POA: Diagnosis not present

## 2019-03-24 DIAGNOSIS — H9190 Unspecified hearing loss, unspecified ear: Secondary | ICD-10-CM | POA: Diagnosis not present

## 2019-03-24 DIAGNOSIS — Z832 Family history of diseases of the blood and blood-forming organs and certain disorders involving the immune mechanism: Secondary | ICD-10-CM | POA: Insufficient documentation

## 2019-03-24 MED ORDER — CYANOCOBALAMIN 1000 MCG/ML IJ SOLN
INTRAMUSCULAR | Status: AC
  Filled 2019-03-24: qty 1

## 2019-03-24 MED ORDER — FOLIC ACID 1 MG PO TABS: 1.0000 mg | ORAL_TABLET | Freq: Every day | ORAL | 3 refills | Status: DC

## 2019-03-24 MED ORDER — CYANOCOBALAMIN 1000 MCG/ML IJ SOLN
1000.0000 ug | Freq: Once | INTRAMUSCULAR | Status: AC
  Administered 2019-03-24: 14:00:00 1000 ug via INTRAMUSCULAR

## 2019-03-24 NOTE — Patient Instructions (Addendum)
[place] at [facility] Discharge Instructions  You were seen today by Dr. Delton Coombes. He went over your recent lab results. He will see you back in 10 days  for labs, treatment and follow up.Received Vit B12 injection today as well   Thank you for choosing [facility] to provide your oncology and hematology care.  To afford each patient quality time with our provider, please arrive at least 15 minutes before your scheduled appointment time.   If you have a lab appointment with the [facility] please come in thru the  Main Entrance and check in at the main information desk  You need to re-schedule your appointment should you arrive 10 or more minutes late.  We strive to give you quality time with our providers, and arriving late affects you and other patients whose appointments are after yours.  Also, if you no show three or more times for appointments you may be dismissed from the clinic at the providers discretion.     Again, thank you for choosing [facility].  Our hope is that these requests will decrease the amount of time that you wait before being seen by our physicians.       _____________________________________________________________  Should you have questions after your visit to [facility], please contact our office at [phone] between the hours of 8:00 a.m. and 4:30 p.m.  Voicemails left after 4:00 p.m. will not be returned until the following business day.  For prescription refill requests, have your pharmacy contact our office and allow 72 hours.    Cancer Center Support Programs:   > Cancer Support Group  2nd Tuesday of the month 1pm-2pm, Journey Room

## 2019-03-24 NOTE — Assessment & Plan Note (Signed)
1.  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  She is quit smoking and has been on Chantix for the past few weeks. - 10 to 15 pound weight loss. - CT of the chest with contrast on 01/24/2019 shows 3.6 x 3.3 cm spiculated mass in the right lung apex highly concerning for malignancy.  Also noted a 3.1 x 2.3 cm cavitary spiculated mass in the right lower lobe.  No enlarged lymphadenopathy was seen. - MRI of the brain on 02/04/2019 shows no evidence of metastasis.  Cerebral white matter T2 hyperintensities with a configuration most suspicious for multiple sclerosis.  However patient does not have any clinical signs or symptoms of MS at this time. - PET CT scan on 02/07/2019 shows hypermetabolic right upper and right lower lobe lung masses, consistent with synchronous primary bronchogenic carcinoma.  No evidence of hypermetabolic metastatic disease. -She underwent right upper lobe wedge and right lower lobe segmentectomy and lymph node dissection on 02/27/2019. - Pathology showed 3.3 cm invasive adenocarcinoma in the right lower lobe, positive visceral pleural invasion.  2.7 cm invasive adenocarcinoma in the right upper lobe with no pleural invasion.  11 mediastinal and hilar lymph nodes were negative.  Pathological staging is PT4PN0.  Lymphovascular invasion not identified.  All margins are negative. - We talked about the pathology report in detail.  I have recommended 4 cycles of adjuvant platinum based chemotherapy to decrease the chance of recurrence of her malignancy. - She does not have any neuropathy.  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  Hence she is not a candidate for cisplatin. -We talked about chemotherapy with carboplatin and pemetrexed given once every 3 weeks for 4 cycles.  Patient is reluctant to consider PICC line. -We talked about side effects in detail.  She will proceed with vitamin B12 injection.  We have sent a  prescription for folic acid tablet to be taken daily. -We plan to start her therapy in 1 to 2 weeks.  2.  Family history: -Paternal aunt had lung cancer. -Mother had MDS. -Maternal grandmother had uterine cancer.

## 2019-03-24 NOTE — Progress Notes (Signed)
START ON PATHWAY REGIMEN - Non-Small Cell Lung     A cycle is every 21 days:     Pemetrexed      Carboplatin   **Always confirm dose/schedule in your pharmacy ordering system**  Patient Characteristics: Stage IIB - III - Resected (Adjuvant), No Prior Chemotherapy, Nonsquamous Cell AJCC T Category: T4 Current Disease Status: No Distant Mets or Local Recurrence AJCC N Category: N0 AJCC M Category: M0 AJCC 8 Stage Grouping: IIIA Histology: Nonsquamous Cell Intent of Therapy: Curative Intent, Discussed with Patient

## 2019-03-24 NOTE — Progress Notes (Signed)
Madeline Gates tolerated Vit B12 injection well without complaints or incident. Pt discharged self ambulatory in satisfactory condition

## 2019-03-24 NOTE — Progress Notes (Signed)
[place], [place] [address]   CLINIC:  Medical Oncology/Hematology  PCP:  Dione Housekeeper, [place] [place] [address] [phone]   REASON FOR VISIT:  Follow-up for right lung mass    INTERVAL HISTORY:  Ms. Omar 63 y.o. female for follow-up after her lung surgery.  She had a prolonged air leak and was discharged home with a chest tube.  Finally it was discontinued.  She is feeling much better at this time.  Appetite and energy levels are 75%.  No pain at the surgical site reported.  She wears hearing aids for many years.  Denies any tingling or numbness in extremities.  Denies any nausea, vomiting, diarrhea or constipation.  Denies any bleeding episodes.     REVIEW OF SYSTEMS:  Review of Systems  All other systems reviewed and are negative.    PAST MEDICAL/SURGICAL HISTORY:  Past Medical History:  Diagnosis Date  . Anxiety   . IBS (irritable bowel syndrome)   . Multiple lung nodules    Past Surgical History:  Procedure Laterality Date  . SEGMENTECOMY Right 02/27/2019   Procedure: RIGHT UPPER WEDGE AND RIGHT LOWER SEGMENTECTOMY LYMPH NODE DISSECTION;  Surgeon: [name], MD;  Location: [facility];  Service: Thoracic;  Laterality: Right;  . TONSILLECTOMY    . TUBAL LIGATION    . VIDEO ASSISTED THORACOSCOPY Right 02/27/2019   Procedure: VIDEO ASSISTED THORACOSCOPY;  Surgeon: [name], MD;  Location: [facility];  Service: Thoracic;  Laterality: Right;     SOCIAL HISTORY:  Social History   Socioeconomic History  . Marital status: Married    Spouse name: Percell Miller  . Number of children: 1  . Years of education: Not on file  . Highest education level: Not on file  Occupational History  . Occupation: Forensic psychologist  Social Needs  . Financial resource strain: Not hard at all  . Food insecurity:    Worry: Never true    Inability: Never true  . Transportation needs:    Medical: Yes   Non-medical: Yes  Tobacco Use  . Smoking status: Former Smoker    Packs/day: 1.00    Types: Cigarettes    Last attempt to quit: 01/26/2019    Years since quitting: 0.1  . Smokeless tobacco: Never Used  Substance and Sexual Activity  . Alcohol use: Not Currently  . Drug use: Not Currently  . Sexual activity: Not on file  Lifestyle  . Physical activity:    Days per week: 2 days    Minutes per session: 20 min  . Stress: Rather much  Relationships  . Social connections:    Talks on phone: More than three times a week    Gets together: Twice a week    Attends religious service: Never    Active member of club or organization: No    Attends meetings of clubs or organizations: Never    Relationship status: Married  . Intimate partner violence:    Fear of current or ex partner: No    Emotionally abused: No    Physically abused: No    Forced sexual activity: No  Other Topics Concern  . Not on file  Social History Narrative  . Not on file    FAMILY HISTORY:  Family History  Problem Relation Age of Onset  . Myelodysplastic syndrome Mother   . Clotting disorder Father   . Lung cancer Paternal Aunt     CURRENT MEDICATIONS:  Outpatient Encounter Medications as of 03/24/2019  Medication Sig  . acetaminophen (TYLENOL) 500 MG tablet Take 500 mg by mouth 2 (two) times daily as needed for headache.  Marland Kitchen amiodarone (PACERONE) 200 MG tablet Take 1 tablet (200 mg total) by mouth daily.  . Calcium Carb-Cholecalciferol (CALCIUM 600+D3) 600-800 MG-UNIT TABS Take 1 tablet by mouth daily.  Marland Kitchen doxylamine, Sleep, (UNISOM) 25 MG tablet Take 12.5 mg by mouth at bedtime as needed for sleep.  Marland Kitchen oxymetazoline (AFRIN) 0.05 % nasal spray Place 1 spray into both nostrils 2 (two) times daily as needed for congestion.  . sertraline (ZOLOFT) 100 MG tablet Take 100 mg by mouth daily at 3 pm.   . sodium chloride (OCEAN) 0.65 % SOLN nasal spray Place 1 spray into both nostrils as needed for congestion.  .  dicyclomine (BENTYL) 20 MG tablet Take by mouth.  . folic acid (FOLVITE) 1 MG tablet Take 1 tablet (1 mg total) by mouth daily.  . Multiple Vitamin (MULTIVITAMIN WITH MINERALS) TABS tablet Take 1 tablet by mouth 2 (two) times a week.  Marland Kitchen oxyCODONE (OXY IR/ROXICODONE) 5 MG immediate release tablet Take 1 tablet (5 mg total) by mouth every 6 (six) hours as needed for severe pain. (Patient not taking: Reported on 03/24/2019)  . [EXPIRED] cyanocobalamin ((VITAMIN B-12)) injection 1,000 mcg    No facility-administered encounter medications on file as of 03/24/2019.     ALLERGIES:  Allergies  Allergen Reactions  . Demerol [Meperidine Hcl] Swelling    Swelling around IV site  . Ibuprofen Diarrhea and Nausea And Vomiting     PHYSICAL EXAM:  ECOG Performance status: 0  Vitals:   03/24/19 1307  BP: 110/65  Pulse: 73  Resp: 18  Temp: 98.6 F (37 C)  SpO2: 100%   Filed Weights   03/24/19 1307  Weight: 104 lb 8 oz (47.4 kg)    Physical Exam Vitals signs reviewed.  Constitutional:      Appearance: Normal appearance.  Cardiovascular:     Rate and Rhythm: Normal rate and regular rhythm.     Pulses: Normal pulses.     Heart sounds: Normal heart sounds.  Pulmonary:     Breath sounds: Normal breath sounds.  Abdominal:     General: There is no distension.     Palpations: Abdomen is soft. There is no mass.  Musculoskeletal:        General: No swelling.  Skin:    General: Skin is warm.  Neurological:     General: No focal deficit present.     Mental Status: She is alert and oriented to person, place, and time.  Psychiatric:        Mood and Affect: Mood normal.        Behavior: Behavior normal.      LABORATORY DATA:  I have reviewed the labs as listed.  CBC    Component Value Date/Time   WBC 15.2 (H) 03/03/2019 0405   RBC 3.69 (L) 03/03/2019 0405   HGB 11.4 (L) 03/03/2019 0405   HCT 34.0 (L) 03/03/2019 0405   PLT 411 (H) 03/03/2019 0405   MCV 92.1 03/03/2019 0405   MCH  30.9 03/03/2019 0405   MCHC 33.5 03/03/2019 0405   RDW 13.8 03/03/2019 0405   CMP Latest Ref Rng & Units 03/08/2019 03/03/2019 03/02/2019  Glucose 70 - 99 mg/dL 115(H) 108(H) 105(H)  BUN 8 - 23 mg/dL 9 10 7(L)  Creatinine 0.44 - 1.00 mg/dL 0.70 0.60 0.54  Sodium 135 -  145 mmol/L 139 136 138  Potassium 3.5 - 5.1 mmol/L 4.1 3.8 3.7  Chloride 98 - 111 mmol/L 96(L) 99 100  CO2 22 - 32 mmol/L 32 28 29  Calcium 8.9 - 10.3 mg/dL 9.7 9.0 9.0  Total Protein 6.5 - 8.1 g/dL - - -  Total Bilirubin 0.3 - 1.2 mg/dL - - -  Alkaline Phos 38 - 126 U/L - - -  AST 15 - 41 U/L - - -  ALT 0 - 44 U/L - - -       DIAGNOSTIC IMAGING:  I have independently reviewed the scans and discussed with the patient.   I have reviewed Venita Lick LPN's note and agree with the documentation.  I personally performed a face-to-face visit, made revisions and my assessment and plan is as follows.    ASSESSMENT & PLAN:   Primary lung adenocarcinoma, right (HCC) 1.  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  She is quit smoking and has been on Chantix for the past few weeks. - 10 to 15 pound weight loss. - CT of the chest with contrast on 01/24/2019 shows 3.6 x 3.3 cm spiculated mass in the right lung apex highly concerning for malignancy.  Also noted a 3.1 x 2.3 cm cavitary spiculated mass in the right lower lobe.  No enlarged lymphadenopathy was seen. - MRI of the brain on 02/04/2019 shows no evidence of metastasis.  Cerebral white matter T2 hyperintensities with a configuration most suspicious for multiple sclerosis.  However patient does not have any clinical signs or symptoms of MS at this time. - PET CT scan on 02/07/2019 shows hypermetabolic right upper and right lower lobe lung masses, consistent with synchronous primary bronchogenic carcinoma.  No evidence of hypermetabolic metastatic disease. -She underwent right upper lobe wedge and right lower lobe  segmentectomy and lymph node dissection on 02/27/2019. - Pathology showed 3.3 cm invasive adenocarcinoma in the right lower lobe, positive visceral pleural invasion.  2.7 cm invasive adenocarcinoma in the right upper lobe with no pleural invasion.  11 mediastinal and hilar lymph nodes were negative.  Pathological staging is PT4PN0.  Lymphovascular invasion not identified.  All margins are negative. - We talked about the pathology report in detail.  I have recommended 4 cycles of adjuvant platinum based chemotherapy to decrease the chance of recurrence of her malignancy. - She does not have any neuropathy.  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  Hence she is not a candidate for cisplatin. -We talked about chemotherapy with carboplatin and pemetrexed given once every 3 weeks for 4 cycles.  Patient is reluctant to consider PICC line. -We talked about side effects in detail.  She will proceed with vitamin B12 injection.  We have sent a prescription for folic acid tablet to be taken daily. -We plan to start her therapy in 1 to 2 weeks.  2.  Family history: -Paternal aunt had lung cancer. -Mother had MDS. -Maternal grandmother had uterine cancer.  Total time spent is 40 minutes with more than 50% of the time spent face-to-face discussing new diagnosis, treatment plan and coordination of care.    Orders placed this encounter:  No orders of the defined types were placed in this encounter.     Derek Jack, MD [facility] [phone]

## 2019-03-25 ENCOUNTER — Encounter: Payer: BLUE CROSS/BLUE SHIELD | Admitting: Thoracic Surgery (Cardiothoracic Vascular Surgery)

## 2019-03-25 MED ORDER — PROCHLORPERAZINE MALEATE 10 MG PO TABS: 10.0000 mg | ORAL_TABLET | Freq: Four times a day (QID) | ORAL | 1 refills | Status: DC | PRN

## 2019-03-25 NOTE — Patient Instructions (Addendum)
[facility] Chemotherapy Teaching    You have been diagnosed with Stage III non-small cell lung cancer.  You will be treated every 21 days x 4 cycles with two chemotherapy medications - carboplatin and pemetrexed (Alimta).  The intent of treatment is to cure your cancer.  You will see the doctor regularly throughout treatment.  We monitor your lab work prior to every treatment. The doctor monitors your response to treatment by the way you are feeling, your blood work, and scans periodically.  There will be wait times while you are here for treatment.  It will take about 30 minutes to 1 hour for your lab work to result.  Then there will be wait times while pharmacy mixes your medications.   You must take folic acid every day by mouth beginning 7 days before your first dose of alimta.  You must keep taking folic acid every day during the time you are being treated with alimta, and for every day for 21 days after you receive your last alimta dose.    You will get B12 injections while you are on alimta.  The first one about 1 week prior to starting treatment and then about every 9 weeks during treatment.   Medications you will receive in the clinic prior to your chemotherapy medications:  Aloxi:  ALOXI is used in adults to help prevent the nausea and vomiting that happens with certain anti-cancer medicines (chemotherapy).  Aloxi is a long acting medication, and will remain in your system for 24-36 hours.   Emend:  This is an anti-nausea medication that is used with Aloxi to help prevent nausea and vomiting caused by chemotherapy.  Dexamethasone:  This is a steroid given prior to chemotherapy to help prevent allergic reactions; it may also help prevent and control nausea and diarrhea.    Carboplatin (Generic Name) Other Names: Paraplatin, CBDCA  About This Drug Carboplatin is a drug used to treat cancer. This drug is given in the vein (IV).  Takes 30 minutes to infuse.   Possible  Side Effects (More Common) . Nausea and throwing up (vomiting). These symptoms may happen within a few hours after your treatment and may last up to 24 hours. Medicines are available to stop or lessen these side effects. . Bone marrow depression. This is a decrease in the number of white blood cells, red blood cells, and platelets. This may raise your risk of infection, make you tired and weak (fatigue), and raise your risk of bleeding. . Soreness of the mouth and throat. You may have red areas, white patches, or sores that hurt. . This drug may affect how your kidneys work. Your kidney function will be checked as needed. . Electrolyte changes. Your blood will be checked for electrolyte changes as needed.  Possible Side Effects (Less Common) . Hair loss. Some patients lose their hair on the scalp and body. You may notice your hair thinning seven to 14 days after getting this drug. . Effects on the nerves are called peripheral neuropathy. You may feel numbness, tingling, or pain in your hands and feet. It may be hard for you to button your clothes, open jars, or walk as usual. The effect on the nerves may get worse with more doses of the drug. These effects get better in some people after the drug is stopped but it does not get better in all people. . Loose bowel movements (diarrhea) that may last for several days . Decreased hearing or ringing in  the ears . Changes in the way food and drinks taste . Changes in liver function. Your liver function will be checked as needed  Allergic Reactions Serious allergic reactions including anaphylaxis are rare. While you are getting this drug in your vein (IV), tell your nurse right away if you have any of these symptoms of an allergic reaction: . Trouble catching your breath . Feeling like your tongue or throat are swelling . Feeling your heart beat quickly or in a not normal way (palpitations) . Feeling dizzy or lightheaded . Flushing, itching, rash,  and/or hives  Treating Side Effects . Drink 6-8 cups of fluids each day unless your doctor has told you to limit your fluid intake due to some other health problem. A cup is 8 ounces of fluid. If you throw up or have loose bowel movements, you should drink more fluids so that you do not become dehydrated (lack water in the body from losing too much fluid). . Mouth care is very important. Your mouth care should consist of routine, gentle cleaning of your teeth or dentures and rinsing your mouth with a mixture of 1/2 teaspoon of salt in 8 ounces of water or  teaspoon of baking soda in 8 ounces of water. This should be done at least after each meal and at bedtime. . If you have mouth sores, avoid mouthwash that has alcohol. Avoid alcohol and smoking because they can bother your mouth and throat. . If you have numbness and tingling in your hands and feet, be careful when cooking, walking, and handling sharp objects and hot liquids. . Talk with your nurse about getting a wig before you lose your hair. Also, call the [place] at 800-ACS-2345 to find out information about the "Look Good, Feel Better" program close to where you live. It is a free program where women getting chemotherapy can learn about wigs, turbans and scarves as well as makeup techniques and skin and nail care.  Food and Drug Interactions There are no known interactions of carboplatin with food. This drug may interact with other medicines. Tell your doctor and pharmacist about all the medicines and dietary supplements (vitamins, minerals, herbs and others) that you are taking at this time. The safety and use of dietary supplements and alternative diets are often not known. Using these might affect your cancer or interfere with your treatment. Until more is known, you should not use dietary supplements or alternative diets without your cancer doctor's help.  When to Call the Doctor Call your doctor or nurse right away if you  have any of these symptoms: . Fever of 100.5 F (38 C) or above; chills . Bleeding or bruising that is not normal . Wheezing or trouble breathing . Nausea that stops you from eating or drinking . Throwing up more than once a day . Rash or itching . Loose bowel movements (diarrhea) more than four times a day or diarrhea with weakness or feeling lightheaded . Call your doctor or nurse as soon as possible if any of these symptoms happen: . Numbness, tingling, decreased feeling or weakness in fingers, toes, arms, or legs . Change in hearing, ringing in the ears . Blurred vision or other changes in eyesight . Decreased urine . Yellowing of skin or eyes  Sexual Problems and Reproductive Concerns Sexual problems and reproduction concerns may happen. In both men and women, this drug may affect your ability to have children. This cannot be determined before your treatment. Talk with your doctor  or nurse if you plan to have children. Ask for information on sperm or egg banking. In men, this drug may interfere with your ability to make sperm, but it should not change your ability to have sexual relations. In women, menstrual bleeding may become irregular or stop while you are getting this drug. Do not assume that you cannot become pregnant if you do not have a menstrual period. Women may go through signs of menopause (change of life) like vaginal dryness or itching. Vaginal lubricants can be used to lessen vaginal dryness, itching, and pain during sexual relations. Genetic counseling is available for you to talk about the effects of this drug therapy on future pregnancies. Also, a genetic counselor can look at the possible risk of problems in the unborn baby due to this medicine if an exposure happens during pregnancy. . Pregnancy warning: This drug may have harmful effects on the unborn child, so effective methods of birth control should be used during your cancer treatment. . Breast feeding warning: It is  not known if this drug passes into breast milk. For this reason, women should talk to their doctor about the risks and benefits of breast feeding during treatment with this drug because this drug may enter the breast milk and badly harm a breast feeding baby.   Pemetrexed (Generic Name) Other Names: ALIMTA  About This Drug Pemetrexed is used to treat cancer. It is given in the vein (IV).  Takes 10 minutes to infuse.   Possible Side Effects (More Common) . Bone marrow depression. This is a decrease in the number of white blood cells, red blood cells, and platelets. This may raise your risk of infection, make you tired and weak (fatigue), and raise your risk of bleeding. . Fatigue . Soreness of the mouth and throat. You may have red areas, white patches, or sores that hurt.  Possible Side Effects (less common) . Trouble breathing or feeling short of breath . Nausea and throwing up (vomiting) . Skin rash  Treating Side Effects . Ask your doctor or nurse about medicine that is available to help stop or lessen nausea and throwing up. . If you get a rash, do not put anything on it unless your doctor or nurse says you may. Keep the area around the rash clean and dry. . Mouth care is very important. Your mouth care should consist of routine, gentle cleaning of your teeth or dentures and rinsing your mouth with a mixture of 1/2 teaspoon of salt in 8 ounces of water or  teaspoon of baking soda in 8 ounces of water. This should be done at least after each meal and at bedtime. . If you have mouth sores, avoid mouthwash that has alcohol. Avoid alcohol and smoking because they can bother your mouth and throat.  Food and Drug Interactions There are no known interactions of this medicine with food. Tell your doctor if you are taking ibuprofen. Pemetrexed may interact with other medicines. Tell your doctor and pharmacist about all the medicines and dietary supplements (vitamins, minerals, herbs and  others) that you are taking at this time. The safety and use of dietary supplements and alternative diets are often not known. Using these might affect your cancer or interfere with your treatment. Until more is known, you should not use dietary supplements or alternative diets without your cancer doctor's help.  When to Call the Doctor Call your doctor or nurse right away if you have any of these symptoms: . Temperature of 100.5  F (38 C) or above . Chills . Easy bruising or bleeding . Trouble breathing . Chest pain . Nausea that stops you from eating or drinking . Throwing up more than 3 times in a day Call your doctor or nurse as soon as possible if you have any of these symptoms: . Rash that does not go away with prescribed medicine . Nausea or vomiting that does not go away with prescribed medicine . Extreme fatigue that interferes with normal activities  Reproduction Concerns . Pregnancy warning: This drug may have harmful effects on the unborn child, so effective methods of birth control should be used during your cancer treatment. . Genetic counseling is available for you to talk about the effects of this drug therapy on future pregnancies. Also, a genetic counselor can look at the possible risk of problems in the unborn baby due to this medicine if an exposure happens during pregnancy. . Breast feeding warning: It is not known if this drug passes into breast milk. For this reason, women should talk to their doctor about the risks and benefits of breast feeding during treatment with this drug because this drug may enter the breast milk and badly harm a breast feeding baby.  SELF CARE ACTIVITIES WHILE ON CHEMOTHERAPY:  Hydration Increase your fluid intake 48 hours prior to treatment and drink at least 8 to 12 cups (64 ounces) of water/decaffeinated beverages per day after treatment. You can still have your cup of coffee or soda but these beverages do not count as part of your 8 to 12  cups that you need to drink daily. No alcohol intake.  Medications Continue taking your normal prescription medication as prescribed.  If you start any new herbal or new supplements please let us know first to make sure it is safe.  Mouth Care Have teeth cleaned professionally before starting treatment. Keep dentures and partial plates clean. Use soft toothbrush and do not use mouthwashes that contain alcohol. Biotene is a good mouthwash that is available at most pharmacies or may be ordered by calling [phone]. Use warm salt water gargles (1 teaspoon salt per 1 quart warm water) before and after meals and at bedtime. Or you may rinse with 2 tablespoons of three-percent hydrogen peroxide mixed in eight ounces of water. If you are still having problems with your mouth or sores in your mouth please call the clinic. If you need dental work, please let the doctor know before you go for your appointment so that we can coordinate the best possible time for you in regards to your chemo regimen. You need to also let your dentist know that you are actively taking chemo. We may need to do labs prior to your dental appointment.  Skin Care Always use sunscreen that has not expired and with SPF (Sun Protection Factor) of 50 or higher. Wear hats to protect your head from the sun. Remember to use sunscreen on your hands, ears, face, & feet.  Use good moisturizing lotions such as udder cream, eucerin, or even Vaseline. Some chemotherapies can cause dry skin, color changes in your skin and nails.    . Avoid long, hot showers or baths. . Use gentle, fragrance-free soaps and laundry detergent. . Use moisturizers, preferably creams or ointments rather than lotions because the thicker consistency is better at preventing skin dehydration. Apply the cream or ointment within 15 minutes of showering. Reapply moisturizer at night, and moisturize your hands every time after you wash them.  Hair Loss (if  your doctor says  your hair will fall out)  . If your doctor says that your hair is likely to fall out, decide before you begin chemo whether you want to wear a wig. You may want to shop before treatment to match your hair color. . Hats, turbans, and scarves can also camouflage hair loss, although some people prefer to leave their heads uncovered. If you go bare-headed outdoors, be sure to use sunscreen on your scalp. . Cut your hair short. It eases the inconvenience of shedding lots of hair, but it also can reduce the emotional impact of watching your hair fall out. . Don't perm or color your hair during chemotherapy. Those chemical treatments are already damaging to hair and can enhance hair loss. Once your chemo treatments are done and your hair has grown back, it's OK to resume dyeing or perming hair.  With chemotherapy, hair loss is almost always temporary. But when it grows back, it may be a different color or texture. In older adults who still had hair color before chemotherapy, the new growth may be completely gray.  Often, new hair is very fine and soft.  Infection Prevention Please wash your hands for at least 30 seconds using warm soapy water. Handwashing is the #1 way to prevent the spread of germs. Stay away from sick people or people who are getting over a cold. If you develop respiratory systems such as green/yellow mucus production or productive cough or persistent cough let us know and we will see if you need an antibiotic. It is a good idea to keep a pair of gloves on when going into grocery stores/[employer] to decrease your risk of coming into contact with germs on the carts, etc. Carry alcohol hand gel with you at all times and use it frequently if out in public. If your temperature reaches 100.5 or higher please call the clinic and let us know.  If it is after hours or on the weekend please go to the ER if your temperature is over 100.5.  Please have your own personal thermometer at home to use.    Sex  and bodily fluids If you are going to have sex, a condom must be used to protect the person that isn't taking chemotherapy. Chemo can decrease your libido (sex drive). For a few days after chemotherapy, chemotherapy can be excreted through your bodily fluids.  When using the toilet please close the lid and flush the toilet twice.  Do this for a few day after you have had chemotherapy.   Effects of chemotherapy on your sex life Some changes are simple and won't last long. They won't affect your sex life permanently.  Sometimes you may feel: . too tired . not strong enough to be very active . sick or sore  . not in the mood . anxious or low Your anxiety might not seem related to sex. For example, you may be worried about the cancer and how your treatment is going. Or you may be worried about money, or about how you family are coping with your illness. These things can cause stress, which can affect your interest in sex. It's important to talk to your partner about how you feel. Remember - the changes to your sex life don't usually last long. There's usually no medical reason to stop having sex during chemo. The drugs won't have any long term physical effects on your performance or enjoyment of sex. Cancer can't be passed on to your partner during  sex  Contraception It's important to use reliable contraception during treatment. Avoid getting pregnant while you or your partner are having chemotherapy. This is because the drugs may harm the baby. Sometimes chemotherapy drugs can leave a man or woman infertile.  This means you would not be able to have children in the future. You might want to talk to someone about permanent infertility. It can be very difficult to learn that you may no longer be able to have children. Some people find counselling helpful. There might be ways to preserve your fertility, although this is easier for men than for women. You may want to speak to a fertility expert. You can  talk about sperm banking or harvesting your eggs. You can also ask about other fertility options, such as donor eggs. If you have or have had breast cancer, your doctor might advise you not to take the contraceptive pill. This is because the hormones in it might affect the cancer.  It is not known for sure whether or not chemotherapy drugs can be passed on through semen or secretions from the vagina. Because of this some doctors advise people to use a barrier method if you have sex during treatment. This applies to vaginal, anal or oral sex. Generally, doctors advise a barrier method only for the time you are actually having the treatment and for about a week after your treatment. Advice like this can be worrying, but this does not mean that you have to avoid being intimate with your partner. You can still have close contact with your partner and continue to enjoy sex.  Animals If you have cats or birds we just ask that you not change the litter or change the cage.  Please have someone else do this for you while you are on chemotherapy.   Food Safety During and After Cancer Treatment Food safety is important for people both during and after cancer treatment. Cancer and cancer treatments, such as chemotherapy, radiation therapy, and stem cell/bone marrow transplantation, often weaken the immune system. This makes it harder for your body to protect itself from foodborne illness, also called food poisoning. Foodborne illness is caused by eating food that contains harmful bacteria, parasites, or viruses.  Foods to avoid Some foods have a higher risk of becoming tainted with bacteria. These include: Marland Kitchen Unwashed fresh fruit and vegetables, especially leafy vegetables that can hide dirt and other contaminants . Raw sprouts, such as alfalfa sprouts . Raw or undercooked beef, especially ground beef, or other raw or undercooked meat and poultry . Fatty, fried, or spicy foods immediately before or after  treatment.  These can sit heavy on your stomach and make you feel nauseous. . Raw or undercooked shellfish, such as oysters. . Sushi and sashimi, which often contain raw fish.  . Unpasteurized beverages, such as unpasteurized fruit juices, raw milk, raw yogurt, or cider . Undercooked eggs, such as soft boiled, over easy, and poached; raw, unpasteurized eggs; or foods made with raw egg, such as homemade raw cookie dough and homemade mayonnaise  Simple steps for food safety  Shop smart. . Do not buy food stored or displayed in an unclean area. . Do not buy bruised or damaged fruits or vegetables. . Do not buy cans that have cracks, dents, or bulges. . Pick up foods that can spoil at the end of your shopping trip and store them in a cooler on the way home.  Prepare and clean up foods carefully. . Rinse all fresh fruits  and vegetables under running water, and dry them with a clean towel or paper towel. . Clean the top of cans before opening them. . After preparing food, wash your hands for 20 seconds with hot water and soap. Pay special attention to areas between fingers and under nails. . Clean your utensils and dishes with hot water and soap. Marland Kitchen Disinfect your kitchen and cutting boards using 1 teaspoon of liquid, unscented bleach mixed into 1 quart of water.    Dispose of old food. . Eat canned and packaged food before its expiration date (the "use by" or "best before" date). . Consume refrigerated leftovers within 3 to 4 days. After that time, throw out the food. Even if the food does not smell or look spoiled, it still may be unsafe. Some bacteria, such as Listeria, can grow even on foods stored in the refrigerator if they are kept for too long.  Take precautions when eating out. . At restaurants, avoid buffets and salad bars where food sits out for a long time and comes in contact with many people. Food can become contaminated when someone with a virus, often a norovirus, or another "bug"  handles it. . Put any leftover food in a "to-go" container yourself, rather than having the server do it. And, refrigerate leftovers as soon as you get home. . Choose restaurants that are clean and that are willing to prepare your food as you order it cooked.   MEDICATIONS:                                                                                                                                                                Compazine/Prochlorperazine 10mg  tablet. Take 1 tablet every 6 hours as needed for nausea/vomiting. (This can make you sleepy)   EMLA cream. Apply a quarter size amount to port site 1 hour prior to chemo. Do not rub in. Cover with plastic wrap.   Over-the-Counter Meds:  Colace - 100 mg capsules - take 2 capsules daily.  If this doesn't help then you can increase to 2 capsules twice daily.  Call us if this does not help your bowels move.   Imodium 2mg  capsule. Take 2 capsules after the 1st loose stool and then 1 capsule every 2 hours until you go a total of 12 hours without having a loose stool. Call the [facility] if loose stools continue. If diarrhea occurs at bedtime, take 2 capsules at bedtime. Then take 2 capsules every 4 hours until morning. Call [facility].    Diarrhea Sheet   If you are having loose stools/diarrhea, please purchase Imodium and begin taking as outlined:  At the first sign of poorly formed or loose stools you should begin taking Imodium (loperamide) 2 mg capsules.  Take two  tablets (4mg ) followed by one tablet (2mg ) every 2 hours - DO NOT EXCEED 8 tablets in 24 hours.  If it is bedtime and you are having loose stools, take 2 tablets at bedtime, then 2 tablets every 4 hours until morning.   Always call the [facility] if you are having loose stools/diarrhea that you can't get under control.  Loose stools/diarrhea leads to dehydration (loss of water) in your body.  We have other options of trying to get the loose stools/diarrhea to stop  but you must let us know!   Constipation Sheet  Colace - 100 mg capsules - take 2 capsules daily.  If this doesn't help then you can increase to 2 capsules twice daily.  Please call if the above does not work for you.   Do not go more than 2 days without a bowel movement.  It is very important that you do not become constipated.  It will make you feel sick to your stomach (nausea) and can cause abdominal pain and vomiting.   Nausea Sheet   Compazine/Prochlorperazine 10mg  tablet. Take 1 tablet every 6 hours as needed for nausea/vomiting. (This can make you sleepy)  If you are having persistent nausea (nausea that does not stop) please call the [facility] and let us know the amount of nausea that you are experiencing.  If you begin to vomit, you need to call the [facility] and if it is the weekend and you have vomited more than one time and can't get it to stop-go to the Emergency Room.  Persistent nausea/vomiting can lead to dehydration (loss of fluid in your body) and will make you feel terrible.   Ice chips, sips of clear liquids, foods that are @ room temperature, crackers, and toast tend to be better tolerated.   SYMPTOMS TO REPORT AS SOON AS POSSIBLE AFTER TREATMENT:   FEVER GREATER THAN 100.5 F  CHILLS WITH OR WITHOUT FEVER  NAUSEA AND VOMITING THAT IS NOT CONTROLLED WITH YOUR NAUSEA MEDICATION  UNUSUAL SHORTNESS OF BREATH  UNUSUAL BRUISING OR BLEEDING  TENDERNESS IN MOUTH AND THROAT WITH OR WITHOUT PRESENCE OF ULCERS  URINARY PROBLEMS  BOWEL PROBLEMS  UNUSUAL RASH      Wear comfortable clothing and clothing appropriate for easy access to any Portacath or PICC line. Let us know if there is anything that we can do to make your therapy better!    What to do if you need assistance after hours or on the weekends: CALL [phone].  HOLD on the line, do not hang up.  You will hear multiple messages but at the end you will be connected with a nurse triage line.   They will contact the doctor if necessary.  Most of the time they will be able to assist you.  Do not call the hospital operator.      I have been informed and understand all of the instructions given to me and have received a copy. I have been instructed to call the clinic [phone] or my family physician as soon as possible for continued medical care, if indicated. I do not have any more questions at this time but understand that I may call the [facility] or the Patient Navigator at [phone] during office hours should I have questions or need assistance in obtaining follow-up care.

## 2019-03-26 ENCOUNTER — Other Ambulatory Visit (HOSPITAL_COMMUNITY): Payer: Self-pay | Admitting: Hematology

## 2019-03-26 ENCOUNTER — Other Ambulatory Visit: Payer: Self-pay | Admitting: *Deleted

## 2019-03-26 ENCOUNTER — Telehealth (HOSPITAL_COMMUNITY): Payer: Self-pay | Admitting: *Deleted

## 2019-03-26 DIAGNOSIS — F419 Anxiety disorder, unspecified: Secondary | ICD-10-CM

## 2019-03-26 DIAGNOSIS — C3491 Malignant neoplasm of unspecified part of right bronchus or lung: Secondary | ICD-10-CM

## 2019-03-26 MED ORDER — ALPRAZOLAM 0.25 MG PO TABS: 0.2500 mg | ORAL_TABLET | Freq: Two times a day (BID) | ORAL | 0 refills | Status: DC | PRN

## 2019-03-26 NOTE — Telephone Encounter (Signed)
Patient called into clinic and left a voicemail stating she was having anxiety attacks about  starting chemotherapy next week. She experienced symptoms such as not sleeping, no appetite, and crying. Patient wanted to know if provider could prescribe her anti-anxiety medication as needed for anxiety. Notified Dr. Larwance Sachs and he prescribed Xanax and sent it to patient's pharmacy on file. Called patient back to let her know Dr. Raliegh Ip prescribed her Xanax 0.25 mg as needed. Patient verbalized understanding.

## 2019-03-31 ENCOUNTER — Other Ambulatory Visit (HOSPITAL_COMMUNITY)
Admission: RE | Admit: 2019-03-31 | Discharge: 2019-03-31 | Disposition: A | Discharge status: Home / Self Care | Payer: BC Managed Care – PPO | Source: Ambulatory Visit | Attending: Hematology | Admitting: Hematology

## 2019-03-31 ENCOUNTER — Other Ambulatory Visit: Payer: Self-pay

## 2019-03-31 DIAGNOSIS — Z1159 Encounter for screening for other viral diseases: Secondary | ICD-10-CM | POA: Insufficient documentation

## 2019-04-01 LAB — NOVEL CORONAVIRUS, NAA (HOSP ORDER, SEND-OUT TO REF LAB; TAT 18-24 HRS): SARS-CoV-2, NAA: NOT DETECTED

## 2019-04-03 ENCOUNTER — Inpatient Hospital Stay (HOSPITAL_COMMUNITY): Payer: BC Managed Care – PPO | Admitting: Hematology

## 2019-04-03 ENCOUNTER — Ambulatory Visit (HOSPITAL_COMMUNITY)
Admission: RE | Admit: 2019-04-03 | Discharge: 2019-04-03 | Disposition: A | Discharge status: Home / Self Care | Payer: BC Managed Care – PPO | Source: Ambulatory Visit | Attending: Thoracic Surgery (Cardiothoracic Vascular Surgery) | Admitting: Thoracic Surgery (Cardiothoracic Vascular Surgery)

## 2019-04-03 ENCOUNTER — Inpatient Hospital Stay (HOSPITAL_COMMUNITY): Payer: BC Managed Care – PPO

## 2019-04-03 ENCOUNTER — Other Ambulatory Visit: Payer: Self-pay

## 2019-04-03 ENCOUNTER — Encounter (HOSPITAL_COMMUNITY): Payer: Self-pay | Admitting: Hematology

## 2019-04-03 VITALS — BP 108/56 | HR 68 | Temp 98.6°F | Resp 16 | Wt 105.2 lb

## 2019-04-03 DIAGNOSIS — Z801 Family history of malignant neoplasm of trachea, bronchus and lung: Secondary | ICD-10-CM | POA: Diagnosis not present

## 2019-04-03 DIAGNOSIS — C3491 Malignant neoplasm of unspecified part of right bronchus or lung: Secondary | ICD-10-CM

## 2019-04-03 DIAGNOSIS — Z87891 Personal history of nicotine dependence: Secondary | ICD-10-CM

## 2019-04-03 DIAGNOSIS — F419 Anxiety disorder, unspecified: Secondary | ICD-10-CM | POA: Diagnosis not present

## 2019-04-03 DIAGNOSIS — Z902 Acquired absence of lung [part of]: Secondary | ICD-10-CM

## 2019-04-03 DIAGNOSIS — H9190 Unspecified hearing loss, unspecified ear: Secondary | ICD-10-CM | POA: Diagnosis not present

## 2019-04-03 DIAGNOSIS — C3411 Malignant neoplasm of upper lobe, right bronchus or lung: Secondary | ICD-10-CM

## 2019-04-03 DIAGNOSIS — Z5111 Encounter for antineoplastic chemotherapy: Secondary | ICD-10-CM | POA: Diagnosis not present

## 2019-04-03 DIAGNOSIS — Z8049 Family history of malignant neoplasm of other genital organs: Secondary | ICD-10-CM

## 2019-04-03 DIAGNOSIS — Z832 Family history of diseases of the blood and blood-forming organs and certain disorders involving the immune mechanism: Secondary | ICD-10-CM

## 2019-04-03 LAB — CBC WITH DIFFERENTIAL/PLATELET
Abs Immature Granulocytes: 0.03 10*3/uL (ref 0.00–0.07)
Basophils Absolute: 0.1 10*3/uL (ref 0.0–0.1)
Basophils Relative: 1 %
Eosinophils Absolute: 0.2 10*3/uL (ref 0.0–0.5)
Eosinophils Relative: 2 %
HCT: 39.9 % (ref 36.0–46.0)
Hemoglobin: 12.5 g/dL (ref 12.0–15.0)
Immature Granulocytes: 0 %
Lymphocytes Relative: 22 %
Lymphs Abs: 2.4 10*3/uL (ref 0.7–4.0)
MCH: 30.1 pg (ref 26.0–34.0)
MCHC: 31.3 g/dL (ref 30.0–36.0)
MCV: 96.1 fL (ref 80.0–100.0)
Monocytes Absolute: 0.5 10*3/uL (ref 0.1–1.0)
Monocytes Relative: 5 %
Neutro Abs: 7.4 10*3/uL (ref 1.7–7.7)
Neutrophils Relative %: 70 %
Platelets: 653 10*3/uL — ABNORMAL HIGH (ref 150–400)
RBC: 4.15 MIL/uL (ref 3.87–5.11)
RDW: 14.1 % (ref 11.5–15.5)
WBC: 10.6 10*3/uL — ABNORMAL HIGH (ref 4.0–10.5)
nRBC: 0 % (ref 0.0–0.2)

## 2019-04-03 LAB — COMPREHENSIVE METABOLIC PANEL
ALT: 9 U/L (ref 0–44)
AST: 16 U/L (ref 15–41)
Albumin: 4 g/dL (ref 3.5–5.0)
Alkaline Phosphatase: 96 U/L (ref 38–126)
Anion gap: 14 (ref 5–15)
BUN: 11 mg/dL (ref 8–23)
CO2: 26 mmol/L (ref 22–32)
Calcium: 9.7 mg/dL (ref 8.9–10.3)
Chloride: 101 mmol/L (ref 98–111)
Creatinine, Ser: 0.86 mg/dL (ref 0.44–1.00)
GFR calc Af Amer: >60 mL/min (ref >60)
GFR calc non Af Amer: >60 mL/min (ref >60)
Glucose, Bld: 109 mg/dL — ABNORMAL HIGH (ref 70–99)
Potassium: 3.4 mmol/L — ABNORMAL LOW (ref 3.5–5.1)
Sodium: 141 mmol/L (ref 135–145)
Total Bilirubin: 0.3 mg/dL (ref 0.3–1.2)
Total Protein: 7.7 g/dL (ref 6.5–8.1)

## 2019-04-03 MED ORDER — LORAZEPAM 0.5 MG PO TABS: 0.5000 mg | ORAL_TABLET | Freq: Three times a day (TID) | ORAL | 0 refills | Status: DC | PRN

## 2019-04-03 MED ORDER — LORAZEPAM 0.5 MG PO TABS: 0.5000 mg | ORAL_TABLET | Freq: Three times a day (TID) | ORAL | 3 refills | Status: DC | PRN

## 2019-04-03 NOTE — Progress Notes (Signed)
[place] [place], [place] [id number]   CLINIC:  Medical Oncology/Hematology  PCP:  Dione Housekeeper, [address] [place] [address] [phone]   REASON FOR VISIT:  Follow-up for right lung cancer.    INTERVAL HISTORY:  Ms. Ryther 63 y.o. female seen for follow-up for right lung cancer.  She is here to discuss chemotherapy.  She denies any tingling or numbness in extremities.  Has mild hearing loss.  Denies any ringing in the ears.  Denies any fevers, night sweats or weight loss in the recent few weeks.  She is functioning well.  She has lots of animals at her house.  She takes care of them.  Appetite is 50%.  Energy levels are 75%.  She has very mild pain at the surgical site which is improving.     REVIEW OF SYSTEMS:  Review of Systems  All other systems reviewed and are negative.    PAST MEDICAL/SURGICAL HISTORY:  Past Medical History:  Diagnosis Date  . Anxiety   . IBS (irritable bowel syndrome)   . Multiple lung nodules    Past Surgical History:  Procedure Laterality Date  . SEGMENTECOMY Right 02/27/2019   Procedure: RIGHT UPPER WEDGE AND RIGHT LOWER SEGMENTECTOMY LYMPH NODE DISSECTION;  Surgeon: [name], MD;  Location: [facility];  Service: Thoracic;  Laterality: Right;  . TONSILLECTOMY    . TUBAL LIGATION    . VIDEO ASSISTED THORACOSCOPY Right 02/27/2019   Procedure: VIDEO ASSISTED THORACOSCOPY;  Surgeon: [name], MD;  Location: [facility];  Service: Thoracic;  Laterality: Right;     SOCIAL HISTORY:  Social History   Socioeconomic History  . Marital status: Married    Spouse name: Percell Miller  . Number of children: 1  . Years of education: Not on file  . Highest education level: Not on file  Occupational History  . Occupation: Forensic psychologist  Social Needs  . Financial resource strain: Not hard at all  . Food insecurity    Worry: Never true    Inability: Never true  . Transportation needs   Medical: Yes    Non-medical: Yes  Tobacco Use  . Smoking status: Former Smoker    Packs/day: 1.00    Types: Cigarettes    Quit date: 01/26/2019    Years since quitting: 0.1  . Smokeless tobacco: Never Used  Substance and Sexual Activity  . Alcohol use: Not Currently  . Drug use: Not Currently  . Sexual activity: Not on file  Lifestyle  . Physical activity    Days per week: 2 days    Minutes per session: 20 min  . Stress: Rather much  Relationships  . Social connections    Talks on phone: More than three times a week    Gets together: Twice a week    Attends religious service: Never    Active member of club or organization: No    Attends meetings of clubs or organizations: Never    Relationship status: Married  . Intimate partner violence    Fear of current or ex partner: No    Emotionally abused: No    Physically abused: No    Forced sexual activity: No  Other Topics Concern  . Not on file  Social History Narrative  . Not on file    FAMILY HISTORY:  Family History  Problem Relation Age of Onset  . Myelodysplastic syndrome Mother   . Clotting disorder Father   . Lung  cancer Paternal Aunt     CURRENT MEDICATIONS:  Outpatient Encounter Medications as of 04/03/2019  Medication Sig  . acetaminophen (TYLENOL) 500 MG tablet Take 500 mg by mouth 2 (two) times daily as needed for headache.  Marland Kitchen amiodarone (PACERONE) 200 MG tablet Take 1 tablet (200 mg total) by mouth daily.  . Calcium Carb-Cholecalciferol (CALCIUM 600+D3) 600-800 MG-UNIT TABS Take 1 tablet by mouth daily.  Marland Kitchen doxylamine, Sleep, (UNISOM) 25 MG tablet Take 12.5 mg by mouth at bedtime as needed for sleep.  . folic acid (FOLVITE) 1 MG tablet Take 1 tablet (1 mg total) by mouth daily.  Marland Kitchen oxymetazoline (AFRIN) 0.05 % nasal spray Place 1 spray into both nostrils 2 (two) times daily as needed for congestion.  . sertraline (ZOLOFT) 100 MG tablet Take 100 mg by mouth daily at 3 pm.   . sodium chloride (OCEAN) 0.65 %  SOLN nasal spray Place 1 spray into both nostrils as needed for congestion.  . [DISCONTINUED] ALPRAZolam (XANAX) 0.25 MG tablet Take 1 tablet (0.25 mg total) by mouth 2 (two) times daily as needed for anxiety.  Derrill Memo ON 04/04/2019] CARBOPLATIN IV Inject into the vein every 21 ( twenty-one) days.  Marland Kitchen dicyclomine (BENTYL) 20 MG tablet Take by mouth.  Marland Kitchen LORazepam (ATIVAN) 0.5 MG tablet Take 1 tablet (0.5 mg total) by mouth every 8 (eight) hours as needed for anxiety.  Derrill Memo ON 04/04/2019] PEMEtrexed 500 mg/m2 in sodium chloride 0.9 % 100 mL Inject into the vein every 21 ( twenty-one) days.  . prochlorperazine (COMPAZINE) 10 MG tablet Take 1 tablet (10 mg total) by mouth every 6 (six) hours as needed (Nausea or vomiting). (Patient not taking: Reported on 04/03/2019)  . [DISCONTINUED] LORazepam (ATIVAN) 0.5 MG tablet Take 1 tablet (0.5 mg total) by mouth every 8 (eight) hours as needed for anxiety.  . [DISCONTINUED] Multiple Vitamin (MULTIVITAMIN WITH MINERALS) TABS tablet Take 1 tablet by mouth 2 (two) times a week.  . [DISCONTINUED] oxyCODONE (OXY IR/ROXICODONE) 5 MG immediate release tablet Take 1 tablet (5 mg total) by mouth every 6 (six) hours as needed for severe pain. (Patient not taking: Reported on 03/24/2019)   No facility-administered encounter medications on file as of 04/03/2019.     ALLERGIES:  Allergies  Allergen Reactions  . Demerol [Meperidine Hcl] Swelling    Swelling around IV site  . Ibuprofen Diarrhea and Nausea And Vomiting     PHYSICAL EXAM:  ECOG Performance status: 0  Vitals:   04/03/19 0900  BP: (!) 108/56  Pulse: 68  Resp: 16  Temp: 98.6 F (37 C)  SpO2: 99%   Filed Weights   04/03/19 0900  Weight: 105 lb 4 oz (47.7 kg)    Physical Exam Vitals signs reviewed.  Constitutional:      Appearance: Normal appearance.  Cardiovascular:     Rate and Rhythm: Normal rate and regular rhythm.     Pulses: Normal pulses.     Heart sounds: Normal heart sounds.   Pulmonary:     Breath sounds: Normal breath sounds.  Abdominal:     General: There is no distension.     Palpations: Abdomen is soft. There is no mass.  Musculoskeletal:        General: No swelling.  Skin:    General: Skin is warm.  Neurological:     General: No focal deficit present.     Mental Status: She is alert and oriented to person, place, and time.  Psychiatric:  Mood and Affect: Mood normal.        Behavior: Behavior normal.      LABORATORY DATA:  I have reviewed the labs as listed.  CBC    Component Value Date/Time   WBC 10.6 (H) 04/03/2019 0842   RBC 4.15 04/03/2019 0842   HGB 12.5 04/03/2019 0842   HCT 39.9 04/03/2019 0842   PLT 653 (H) 04/03/2019 0842   MCV 96.1 04/03/2019 0842   MCH 30.1 04/03/2019 0842   MCHC 31.3 04/03/2019 0842   RDW 14.1 04/03/2019 0842   LYMPHSABS 2.4 04/03/2019 0842   MONOABS 0.5 04/03/2019 0842   EOSABS 0.2 04/03/2019 0842   BASOSABS 0.1 04/03/2019 0842   CMP Latest Ref Rng & Units 04/03/2019 03/08/2019 03/03/2019  Glucose 70 - 99 mg/dL 109(H) 115(H) 108(H)  BUN 8 - 23 mg/dL 11 9 10   Creatinine 0.44 - 1.00 mg/dL 0.86 0.70 0.60  Sodium 135 - 145 mmol/L 141 139 136  Potassium 3.5 - 5.1 mmol/L 3.4(L) 4.1 3.8  Chloride 98 - 111 mmol/L 101 96(L) 99  CO2 22 - 32 mmol/L 26 32 28  Calcium 8.9 - 10.3 mg/dL 9.7 9.7 9.0  Total Protein 6.5 - 8.1 g/dL 7.7 - -  Total Bilirubin 0.3 - 1.2 mg/dL 0.3 - -  Alkaline Phos 38 - 126 U/L 96 - -  AST 15 - 41 U/L 16 - -  ALT 0 - 44 U/L 9 - -       DIAGNOSTIC IMAGING:  I have independently reviewed the scans and discussed with the patient.   I have reviewed Venita Lick LPN's note and agree with the documentation.  I personally performed a face-to-face visit, made revisions and my assessment and plan is as follows.    ASSESSMENT & PLAN:   Primary lung adenocarcinoma, right (HCC) 1.  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  She is quit smoking and has been on Chantix for the past few weeks. - 10 to 15 pound weight loss. - CT of the chest with contrast on 01/24/2019 shows 3.6 x 3.3 cm spiculated mass in the right lung apex highly concerning for malignancy.  Also noted a 3.1 x 2.3 cm cavitary spiculated mass in the right lower lobe.  No enlarged lymphadenopathy was seen. - MRI of the brain on 02/04/2019 shows no evidence of metastasis.  Cerebral white matter T2 hyperintensities with a configuration most suspicious for multiple sclerosis.  However patient does not have any clinical signs or symptoms of MS at this time. - PET CT scan on 02/07/2019 shows hypermetabolic right upper and right lower lobe lung masses, consistent with synchronous primary bronchogenic carcinoma.  No evidence of hypermetabolic metastatic disease. -She underwent right upper lobe wedge and right lower lobe segmentectomy and lymph node dissection on 02/27/2019. - Pathology showed 3.3 cm invasive adenocarcinoma in the right lower lobe, positive visceral pleural invasion.  2.7 cm invasive adenocarcinoma in the right upper lobe with no pleural invasion.  11 mediastinal and hilar lymph nodes were negative.  Pathological staging is PT4PN0.  Lymphovascular invasion not identified.  All margins are negative. - We talked about the pathology report in detail. -I have recommended 4 cycles of carboplatin and pemetrexed.  She was not a good candidate for cisplatin.  She is very anxious and reluctant to consider chemotherapy if there are any significant side effects. - We talked about the chemotherapy regimen with carboplatin and pemetrexed  every 3 weeks for 4 cycles.  We talked about the side effects in detail.  She is already taking folic acid.  She received B12 injection last week. -We reviewed her blood work.  She may proceed with cycle 1 tomorrow.  She wants to do the treatments without a PICC line. - We will see her back in 3 weeks for follow-up.  2.   Family history: -Paternal aunt had lung cancer. -Mother had MDS. -Maternal grandmother had uterine cancer.  3.  Anxiety: - We have given her Xanax 0.25 mg.  It made her cry and more depressed. - I have sent a prescription for Ativan 0.5 mg.  She was told to take half tablet as needed.  Total time spent is 40 minutes with more than 50% of the time spent face-to-face discussing new diagnosis, treatment plan and coordination of care.    Orders placed this encounter:  No orders of the defined types were placed in this encounter.     Derek Jack, MD [facility] [phone]

## 2019-04-03 NOTE — Progress Notes (Signed)

## 2019-04-03 NOTE — Assessment & Plan Note (Signed)
1.  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  She is quit smoking and has been on Chantix for the past few weeks. - 10 to 15 pound weight loss. - CT of the chest with contrast on 01/24/2019 shows 3.6 x 3.3 cm spiculated mass in the right lung apex highly concerning for malignancy.  Also noted a 3.1 x 2.3 cm cavitary spiculated mass in the right lower lobe.  No enlarged lymphadenopathy was seen. - MRI of the brain on 02/04/2019 shows no evidence of metastasis.  Cerebral white matter T2 hyperintensities with a configuration most suspicious for multiple sclerosis.  However patient does not have any clinical signs or symptoms of MS at this time. - PET CT scan on 02/07/2019 shows hypermetabolic right upper and right lower lobe lung masses, consistent with synchronous primary bronchogenic carcinoma.  No evidence of hypermetabolic metastatic disease. -She underwent right upper lobe wedge and right lower lobe segmentectomy and lymph node dissection on 02/27/2019. - Pathology showed 3.3 cm invasive adenocarcinoma in the right lower lobe, positive visceral pleural invasion.  2.7 cm invasive adenocarcinoma in the right upper lobe with no pleural invasion.  11 mediastinal and hilar lymph nodes were negative.  Pathological staging is PT4PN0.  Lymphovascular invasion not identified.  All margins are negative. - We talked about the pathology report in detail. -I have recommended 4 cycles of carboplatin and pemetrexed.  She was not a good candidate for cisplatin.  She is very anxious and reluctant to consider chemotherapy if there are any significant side effects. - We talked about the chemotherapy regimen with carboplatin and pemetrexed every 3 weeks for 4 cycles.  We talked about the side effects in detail.  She is already taking folic acid.  She received B12 injection last week. -We reviewed her blood work.  She may proceed with cycle 1 tomorrow.  She wants to  do the treatments without a PICC line. - We will see her back in 3 weeks for follow-up.  2.  Family history: -Paternal aunt had lung cancer. -Mother had MDS. -Maternal grandmother had uterine cancer.  3.  Anxiety: - We have given her Xanax 0.25 mg.  It made her cry and more depressed. - I have sent a prescription for Ativan 0.5 mg.  She was told to take half tablet as needed.

## 2019-04-03 NOTE — Patient Instructions (Addendum)
[facility] Discharge Instructions  You were seen today by Dr. Katragadda. He went over your recent lab results. He will see you back in 3 weeks for labs and follow up.   Thank you for choosing [facility] at [facility] to provide your oncology and hematology care.  To afford each patient quality time with our provider, please arrive at least 15 minutes before your scheduled appointment time.   If you have a lab appointment with the Cancer Center please come in thru the  Main Entrance and check in at the main information desk  You need to re-schedule your appointment should you arrive 10 or more minutes late.  We strive to give you quality time with our providers, and arriving late affects you and other patients whose appointments are after yours.  Also, if you no show three or more times for appointments you may be dismissed from the clinic at the providers discretion.     Again, thank you for choosing [facility].  Our hope is that these requests will decrease the amount of time that you wait before being seen by our physicians.       _____________________________________________________________  Should you have questions after your visit to [facility], please contact our office at [phone] between the hours of 8:00 a.m. and 4:30 p.m.  Voicemails left after 4:00 p.m. will not be returned until the following business day.  For prescription refill requests, have your pharmacy contact our office and allow 72 hours.    Cancer Center Support Programs:   > Cancer Support Group  2nd Tuesday of the month 1pm-2pm, Journey Room    

## 2019-04-04 ENCOUNTER — Encounter (HOSPITAL_COMMUNITY): Payer: Self-pay

## 2019-04-04 ENCOUNTER — Inpatient Hospital Stay (HOSPITAL_COMMUNITY): Payer: BC Managed Care – PPO

## 2019-04-04 VITALS — BP 107/54 | HR 67 | Temp 98.0°F | Resp 16 | Wt 106.8 lb

## 2019-04-04 DIAGNOSIS — C3491 Malignant neoplasm of unspecified part of right bronchus or lung: Secondary | ICD-10-CM

## 2019-04-04 DIAGNOSIS — Z5111 Encounter for antineoplastic chemotherapy: Secondary | ICD-10-CM | POA: Diagnosis not present

## 2019-04-04 MED ORDER — SODIUM CHLORIDE 0.9 % IV SOLN
375.5000 mg | Freq: Once | INTRAVENOUS | Status: AC
  Administered 2019-04-04: 380 mg via INTRAVENOUS
  Filled 2019-04-04: qty 38

## 2019-04-04 MED ORDER — PALONOSETRON HCL INJECTION 0.25 MG/5ML
0.2500 mg | Freq: Once | INTRAVENOUS | Status: AC
  Administered 2019-04-04: 10:00:00 0.25 mg via INTRAVENOUS
  Filled 2019-04-04: qty 5

## 2019-04-04 MED ORDER — SODIUM CHLORIDE 0.9 % IV SOLN
Freq: Once | INTRAVENOUS | Status: AC
  Administered 2019-04-04: 10:00:00 via INTRAVENOUS
  Filled 2019-04-04: qty 5

## 2019-04-04 MED ORDER — SODIUM CHLORIDE 0.9 % IV SOLN
Freq: Once | INTRAVENOUS | Status: AC
  Administered 2019-04-04: 10:00:00 via INTRAVENOUS

## 2019-04-04 MED ORDER — SODIUM CHLORIDE 0.9 % IV SOLN
480.0000 mg/m2 | Freq: Once | INTRAVENOUS | Status: AC
  Administered 2019-04-04: 700 mg via INTRAVENOUS
  Filled 2019-04-04: qty 8

## 2019-04-04 NOTE — Progress Notes (Signed)
Azucena Fallen tolerated chemo tx well without complaints or incident. Labs from yesterday reviewed prior to administering chemotherapy medications. Peripheral IV site checked by 2 RN's with positive blood return noted prior to and after each chemo medication administered. VSS upon discharge.Reviewed drug specific information with all questions answered. Pt discharged self ambulatory in satisfactory condition

## 2019-04-04 NOTE — Patient Instructions (Signed)
[facility] Discharge Instructions for Patients Receiving Chemotherapy   Beginning January 23rd 2017 lab work for the [facility] will be done in the  Main lab at [facility] on 1st floor. If you have a lab appointment with the [facility] please come in thru the  Main Entrance and check in at the main information desk   Today you received the following chemotherapy agents Alimta and Carboplatin. Follow-up as scheduled. Call clinic for any questions or concerns  To help prevent nausea and vomiting after your treatment, we encourage you to take your nausea medication   If you develop nausea and vomiting, or diarrhea that is not controlled by your medication, call the clinic.  The clinic phone number is [phone]. Office hours are Monday-Friday 8:30am-5:00pm.  BELOW ARE SYMPTOMS THAT SHOULD BE REPORTED IMMEDIATELY:  *FEVER GREATER THAN 101.0 F  *CHILLS WITH OR WITHOUT FEVER  NAUSEA AND VOMITING THAT IS NOT CONTROLLED WITH YOUR NAUSEA MEDICATION  *UNUSUAL SHORTNESS OF BREATH  *UNUSUAL BRUISING OR BLEEDING  TENDERNESS IN MOUTH AND THROAT WITH OR WITHOUT PRESENCE OF ULCERS  *URINARY PROBLEMS  *BOWEL PROBLEMS  UNUSUAL RASH Items with * indicate a potential emergency and should be followed up as soon as possible. If you have an emergency after office hours please contact your primary care physician or go to the nearest emergency department.  Please call the clinic during office hours if you have any questions or concerns.   You may also contact the Patient Navigator at [phone] should you have any questions or need assistance in obtaining follow up care.      Resources For Cancer Patients and their Caregivers ? American Cancer Society: Can assist with transportation, wigs, general needs, runs Look Good Feel Better.        (980) 613-0251 ? Cancer Care: Provides financial assistance, online support groups, medication/co-pay assistance.   1-800-813-HOPE 704-654-3073) ? [facility] Assists [place] cancer patients and their families through emotional , educational and financial support.  239-185-6846 ? [place] DSS Where to apply for food stamps, Medicaid and utility assistance. 873-789-3226 ? RCATS: Transportation to medical appointments. 646 288 2948 ? Social Security Administration: May apply for disability if have a Stage IV cancer. 303-475-0010 [phone] [phone] ? [employer], Disability and Transit Services: Assists with nutrition, care and transit needs. 865-052-6469

## 2019-04-07 ENCOUNTER — Telehealth (HOSPITAL_COMMUNITY): Payer: Self-pay

## 2019-04-07 NOTE — Telephone Encounter (Signed)
24 HR Chemo F/U call : Pt reports she had a good weekend but today she is more fatigued and has had some nausea as well which she is taking her nausea medication for. Pt denies any vomiting, diarrhea or pain. Encouraged the pt to continue to take her nausea medication,eat small frequent meals and drink plenty of fluids as well as call us for any other issues or questions. Pt verbalized understanding.

## 2019-04-08 ENCOUNTER — Encounter: Payer: BC Managed Care – PPO | Admitting: Thoracic Surgery (Cardiothoracic Vascular Surgery)

## 2019-04-16 ENCOUNTER — Other Ambulatory Visit (HOSPITAL_COMMUNITY): Payer: Self-pay | Admitting: Hematology

## 2019-04-16 DIAGNOSIS — C3491 Malignant neoplasm of unspecified part of right bronchus or lung: Secondary | ICD-10-CM

## 2019-04-27 ENCOUNTER — Other Ambulatory Visit: Payer: Self-pay | Admitting: Physician Assistant

## 2019-04-28 ENCOUNTER — Encounter (HOSPITAL_COMMUNITY): Payer: Self-pay | Admitting: Hematology

## 2019-04-28 ENCOUNTER — Other Ambulatory Visit (HOSPITAL_COMMUNITY): Payer: BC Managed Care – PPO

## 2019-04-28 ENCOUNTER — Other Ambulatory Visit: Payer: Self-pay

## 2019-04-28 ENCOUNTER — Inpatient Hospital Stay (HOSPITAL_COMMUNITY): Payer: BC Managed Care – PPO | Admitting: Hematology

## 2019-04-28 ENCOUNTER — Inpatient Hospital Stay (HOSPITAL_COMMUNITY): Payer: BC Managed Care – PPO | Attending: Hematology

## 2019-04-28 ENCOUNTER — Ambulatory Visit (HOSPITAL_COMMUNITY): Payer: BC Managed Care – PPO | Admitting: Hematology

## 2019-04-28 ENCOUNTER — Ambulatory Visit (HOSPITAL_COMMUNITY): Payer: BC Managed Care – PPO

## 2019-04-28 ENCOUNTER — Inpatient Hospital Stay (HOSPITAL_COMMUNITY): Payer: BC Managed Care – PPO

## 2019-04-28 VITALS — BP 98/48 | HR 65 | Temp 98.1°F | Resp 18

## 2019-04-28 DIAGNOSIS — R110 Nausea: Secondary | ICD-10-CM | POA: Insufficient documentation

## 2019-04-28 DIAGNOSIS — C3491 Malignant neoplasm of unspecified part of right bronchus or lung: Secondary | ICD-10-CM

## 2019-04-28 DIAGNOSIS — Z87891 Personal history of nicotine dependence: Secondary | ICD-10-CM

## 2019-04-28 DIAGNOSIS — C3411 Malignant neoplasm of upper lobe, right bronchus or lung: Secondary | ICD-10-CM

## 2019-04-28 DIAGNOSIS — F419 Anxiety disorder, unspecified: Secondary | ICD-10-CM

## 2019-04-28 DIAGNOSIS — Z79899 Other long term (current) drug therapy: Secondary | ICD-10-CM | POA: Insufficient documentation

## 2019-04-28 DIAGNOSIS — Z5111 Encounter for antineoplastic chemotherapy: Secondary | ICD-10-CM | POA: Diagnosis present

## 2019-04-28 LAB — CBC WITH DIFFERENTIAL/PLATELET
Abs Immature Granulocytes: 0.02 10*3/uL (ref 0.00–0.07)
Basophils Absolute: 0.1 10*3/uL (ref 0.0–0.1)
Basophils Relative: 1 %
Eosinophils Absolute: 0.1 10*3/uL (ref 0.0–0.5)
Eosinophils Relative: 1 %
HCT: 40 % (ref 36.0–46.0)
Hemoglobin: 12.9 g/dL (ref 12.0–15.0)
Immature Granulocytes: 0 %
Lymphocytes Relative: 24 %
Lymphs Abs: 1.9 10*3/uL (ref 0.7–4.0)
MCH: 30.6 pg (ref 26.0–34.0)
MCHC: 32.3 g/dL (ref 30.0–36.0)
MCV: 95 fL (ref 80.0–100.0)
Monocytes Absolute: 0.7 10*3/uL (ref 0.1–1.0)
Monocytes Relative: 9 %
Neutro Abs: 5.4 10*3/uL (ref 1.7–7.7)
Neutrophils Relative %: 65 %
Platelets: 446 10*3/uL — ABNORMAL HIGH (ref 150–400)
RBC: 4.21 MIL/uL (ref 3.87–5.11)
RDW: 15.3 % (ref 11.5–15.5)
WBC: 8.2 10*3/uL (ref 4.0–10.5)
nRBC: 0 % (ref 0.0–0.2)

## 2019-04-28 LAB — COMPREHENSIVE METABOLIC PANEL
ALT: 17 U/L (ref 0–44)
AST: 21 U/L (ref 15–41)
Albumin: 4.3 g/dL (ref 3.5–5.0)
Alkaline Phosphatase: 94 U/L (ref 38–126)
Anion gap: 13 (ref 5–15)
BUN: 12 mg/dL (ref 8–23)
CO2: 25 mmol/L (ref 22–32)
Calcium: 10.1 mg/dL (ref 8.9–10.3)
Chloride: 100 mmol/L (ref 98–111)
Creatinine, Ser: 0.9 mg/dL (ref 0.44–1.00)
GFR calc Af Amer: >60 mL/min (ref >60)
GFR calc non Af Amer: >60 mL/min (ref >60)
Glucose, Bld: 106 mg/dL — ABNORMAL HIGH (ref 70–99)
Potassium: 3.8 mmol/L (ref 3.5–5.1)
Sodium: 138 mmol/L (ref 135–145)
Total Bilirubin: <0.1 mg/dL — ABNORMAL LOW (ref 0.3–1.2)
Total Protein: 7.6 g/dL (ref 6.5–8.1)

## 2019-04-28 MED ORDER — PALONOSETRON HCL INJECTION 0.25 MG/5ML
INTRAVENOUS | Status: AC
  Filled 2019-04-28: qty 5

## 2019-04-28 MED ORDER — SODIUM CHLORIDE 0.9 % IV SOLN
Freq: Once | INTRAVENOUS | Status: AC
  Administered 2019-04-28: 10:00:00 via INTRAVENOUS

## 2019-04-28 MED ORDER — PALONOSETRON HCL INJECTION 0.25 MG/5ML
0.2500 mg | Freq: Once | INTRAVENOUS | Status: AC
  Administered 2019-04-28: 0.25 mg via INTRAVENOUS

## 2019-04-28 MED ORDER — SODIUM CHLORIDE 0.9 % IV SOLN
Freq: Once | INTRAVENOUS | Status: AC
  Administered 2019-04-28: 10:00:00 via INTRAVENOUS
  Filled 2019-04-28: qty 5

## 2019-04-28 MED ORDER — SODIUM CHLORIDE 0.9 % IV SOLN
364.5000 mg | Freq: Once | INTRAVENOUS | Status: AC
  Administered 2019-04-28: 360 mg via INTRAVENOUS
  Filled 2019-04-28: qty 36

## 2019-04-28 MED ORDER — SODIUM CHLORIDE 0.9 % IV SOLN
480.0000 mg/m2 | Freq: Once | INTRAVENOUS | Status: AC
  Administered 2019-04-28: 700 mg via INTRAVENOUS
  Filled 2019-04-28: qty 8

## 2019-04-28 NOTE — Assessment & Plan Note (Signed)
1.  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  No evidence of hypermetabolic metastatic disease. - Right upper lobe wedge and right lower lobe segmentectomy and lymph node dissection on 02/27/2019, pathology showing 3.3 cm invasive adenocarcinoma of the right lower lobe, positive visceral pleural invasion.  2.7 cm invasive adenocarcinoma in the right upper lobe with no pleural invasion.  11 mediastinal and hilar lymph nodes were negative.  Pathological staging is PT4PN0.  Lymphovascular invasion not identified.  All margins are negative. - Cycle 1 of carboplatin and pemetrexed on 04/04/2019. - She has experienced 3 days of tiredness after chemotherapy.  She also had some mouth sores which are gone at this time. - She called our office and we called in Zofran which worked better than Compazine. - We have reviewed her blood work.  She may proceed with cycle 2 without any dose modifications. -We will see her back in 3 weeks for follow-up.  2.  Anxiety: - She is taking Ativan 0.5 mg at bedtime as needed.  She is taking one fourth of the same pill during daytime if needed. -She has tried Xanax which made her more depressed.

## 2019-04-28 NOTE — Patient Instructions (Signed)
[facility] Discharge Instructions for Patients Receiving Chemotherapy   Beginning January 23rd 2017 lab work for the [facility] will be done in the  Main lab at [facility] on 1st floor. If you have a lab appointment with the [facility] please come in thru the  Main Entrance and check in at the main information desk   Today you received the following chemotherapy agents Alimta and Carboplatin. Follow-up as scheduled. Call clinic for any questions or concerns  To help prevent nausea and vomiting after your treatment, we encourage you to take your nausea medication   If you develop nausea and vomiting, or diarrhea that is not controlled by your medication, call the clinic.  The clinic phone number is [phone]. Office hours are Monday-Friday 8:30am-5:00pm.  BELOW ARE SYMPTOMS THAT SHOULD BE REPORTED IMMEDIATELY:  *FEVER GREATER THAN 101.0 F  *CHILLS WITH OR WITHOUT FEVER  NAUSEA AND VOMITING THAT IS NOT CONTROLLED WITH YOUR NAUSEA MEDICATION  *UNUSUAL SHORTNESS OF BREATH  *UNUSUAL BRUISING OR BLEEDING  TENDERNESS IN MOUTH AND THROAT WITH OR WITHOUT PRESENCE OF ULCERS  *URINARY PROBLEMS  *BOWEL PROBLEMS  UNUSUAL RASH Items with * indicate a potential emergency and should be followed up as soon as possible. If you have an emergency after office hours please contact your primary care physician or go to the nearest emergency department.  Please call the clinic during office hours if you have any questions or concerns.   You may also contact the Patient Navigator at [phone] should you have any questions or need assistance in obtaining follow up care.      Resources For Cancer Patients and their Caregivers ? American Cancer Society: Can assist with transportation, wigs, general needs, runs Look Good Feel Better.        803-140-0917 ? Cancer Care: Provides financial assistance, online support groups, medication/co-pay assistance.   1-800-813-HOPE (903)480-3709) ? [place] Assists [place] cancer patients and their families through emotional , educational and financial support.  9808225020 ? [place] DSS Where to apply for food stamps, Medicaid and utility assistance. 206-041-9176 ? RCATS: Transportation to medical appointments. 530-133-3463 ? Social Security Administration: May apply for disability if have a Stage IV cancer. 256-504-6325 [phone] [phone] ? [employer], Disability and Transit Services: Assists with nutrition, care and transit needs. 805-221-3796

## 2019-04-28 NOTE — Patient Instructions (Addendum)
[facility] at [facility] Discharge Instructions  You were seen today by Dr. Katragadda. He went over your recent lab results. He will see you back in 3 weeks for labs and follow up.   Thank you for choosing [facility] at [facility] to provide your oncology and hematology care.  To afford each patient quality time with our provider, please arrive at least 15 minutes before your scheduled appointment time.   If you have a lab appointment with the Cancer Center please come in thru the  Main Entrance and check in at the main information desk  You need to re-schedule your appointment should you arrive 10 or more minutes late.  We strive to give you quality time with our providers, and arriving late affects you and other patients whose appointments are after yours.  Also, if you no show three or more times for appointments you may be dismissed from the clinic at the providers discretion.     Again, thank you for choosing [facility].  Our hope is that these requests will decrease the amount of time that you wait before being seen by our physicians.       _____________________________________________________________  Should you have questions after your visit to [facility], please contact our office at [phone] between the hours of 8:00 a.m. and 4:30 p.m.  Voicemails left after 4:00 p.m. will not be returned until the following business day.  For prescription refill requests, have your pharmacy contact our office and allow 72 hours.    Cancer Center Support Programs:   > Cancer Support Group  2nd Tuesday of the month 1pm-2pm, Journey Room    

## 2019-04-28 NOTE — Progress Notes (Signed)
[place] [place], [place] [address]   CLINIC:  Medical Oncology/Hematology  PCP:  Dione Housekeeper, [place] [address] [phone]   REASON FOR VISIT:  Follow-up for right lung cancer.    INTERVAL HISTORY:  Madeline Gates 63 y.o. female seen for cycle 2 of chemotherapy for her right lung cancer.  After first cycle she felt very tired for 4 to 5 days.  She also experienced some nausea which was not controlled with Compazine.  When she called's, we have sent Zofran which helped.  She also developed some mouth sores which lasted few days.  Denied any vomiting, diarrhea or constipation.  Anxiety is well controlled with Ativan at bedtime.  She rarely requires Ativan during daytime.  Appetite is 75%.  Energy levels are 75%.  Denies any ER visits or hospitalizations.     REVIEW OF SYSTEMS:  Review of Systems  HENT:   Positive for mouth sores.   Gastrointestinal: Positive for nausea.  Psychiatric/Behavioral: The patient is nervous/anxious.   All other systems reviewed and are negative.    PAST MEDICAL/SURGICAL HISTORY:  Past Medical History:  Diagnosis Date  . Anxiety   . IBS (irritable bowel syndrome)   . Multiple lung nodules    Past Surgical History:  Procedure Laterality Date  . SEGMENTECOMY Right 02/27/2019   Procedure: RIGHT UPPER WEDGE AND RIGHT LOWER SEGMENTECTOMY LYMPH NODE DISSECTION;  Surgeon: [name], MD;  Location: [facility];  Service: Thoracic;  Laterality: Right;  . TONSILLECTOMY    . TUBAL LIGATION    . VIDEO ASSISTED THORACOSCOPY Right 02/27/2019   Procedure: VIDEO ASSISTED THORACOSCOPY;  Surgeon: [name], MD;  Location: [place];  Service: Thoracic;  Laterality: Right;     SOCIAL HISTORY:  Social History   Socioeconomic History  . Marital status: Married    Spouse name: Percell Miller  . Number of children: 1  . Years of education: Not on file  . Highest education level: Not on file   Occupational History  . Occupation: Forensic psychologist  Social Needs  . Financial resource strain: Not hard at all  . Food insecurity    Worry: Never true    Inability: Never true  . Transportation needs    Medical: Yes    Non-medical: Yes  Tobacco Use  . Smoking status: Former Smoker    Packs/day: 1.00    Types: Cigarettes    Quit date: 01/26/2019    Years since quitting: 0.2  . Smokeless tobacco: Never Used  Substance and Sexual Activity  . Alcohol use: Not Currently  . Drug use: Not Currently  . Sexual activity: Not on file  Lifestyle  . Physical activity    Days per week: 2 days    Minutes per session: 20 min  . Stress: Rather much  Relationships  . Social connections    Talks on phone: More than three times a week    Gets together: Twice a week    Attends religious service: Never    Active member of club or organization: No    Attends meetings of clubs or organizations: Never    Relationship status: Married  . Intimate partner violence    Fear of current or ex partner: No    Emotionally abused: No    Physically abused: No    Forced sexual activity: No  Other Topics Concern  . Not on file  Social History Narrative  . Not on file  FAMILY HISTORY:  Family History  Problem Relation Age of Onset  . Myelodysplastic syndrome Mother   . Clotting disorder Father   . Lung cancer Paternal Aunt     CURRENT MEDICATIONS:  Outpatient Encounter Medications as of 04/28/2019  Medication Sig  . amiodarone (PACERONE) 200 MG tablet Take 1 tablet (200 mg total) by mouth daily.  . Calcium Carb-Cholecalciferol (CALCIUM 600+D3) 600-800 MG-UNIT TABS Take 1 tablet by mouth daily.  Marland Kitchen CARBOPLATIN IV Inject into the vein every 21 ( twenty-one) days.  Marland Kitchen dicyclomine (BENTYL) 20 MG tablet Take 20 mg by mouth as needed.   . folic acid (FOLVITE) 1 MG tablet Take 1 tablet (1 mg total) by mouth daily.  Marland Kitchen LORazepam (ATIVAN) 0.5 MG tablet Take 1 tablet (0.5 mg total) by mouth every 8 (eight)  hours as needed for anxiety.  . ondansetron (ZOFRAN) 4 MG tablet TAKE ONE TABLET (4 MG DOSE) BY MOUTH DAILY AS NEEDED FOR NAUSEA.  Marland Kitchen oxymetazoline (AFRIN) 0.05 % nasal spray Place 1 spray into both nostrils 2 (two) times daily as needed for congestion.  Marland Kitchen PEMEtrexed 500 mg/m2 in sodium chloride 0.9 % 100 mL Inject into the vein every 21 ( twenty-one) days.  . prochlorperazine (COMPAZINE) 10 MG tablet TAKE 1 TABLET (10 MG TOTAL) BY MOUTH EVERY 6 (SIX) HOURS AS NEEDED (NAUSEA OR VOMITING).  Marland Kitchen sertraline (ZOLOFT) 100 MG tablet Take 100 mg by mouth daily at 3 pm.   . sodium chloride (OCEAN) 0.65 % SOLN nasal spray Place 1 spray into both nostrils as needed for congestion.  . [DISCONTINUED] acetaminophen (TYLENOL) 500 MG tablet Take 500 mg by mouth 2 (two) times daily as needed for headache.  . [DISCONTINUED] doxylamine, Sleep, (UNISOM) 25 MG tablet Take 12.5 mg by mouth at bedtime as needed for sleep.   No facility-administered encounter medications on file as of 04/28/2019.     ALLERGIES:  Allergies  Allergen Reactions  . Demerol [Meperidine Hcl] Swelling    Swelling around IV site  . Ibuprofen Diarrhea and Nausea And Vomiting     PHYSICAL EXAM:  ECOG Performance status: 0  Vitals:   04/28/19 0849  BP: (!) 110/56  Pulse: 68  Resp: 16  Temp: (!) 97.3 F (36.3 C)  SpO2: 100%   Filed Weights   04/28/19 0849  Weight: 105 lb (47.6 kg)    Physical Exam Vitals signs reviewed.  Constitutional:      Appearance: Normal appearance.  Cardiovascular:     Rate and Rhythm: Normal rate and regular rhythm.     Pulses: Normal pulses.     Heart sounds: Normal heart sounds.  Pulmonary:     Breath sounds: Normal breath sounds.  Abdominal:     General: There is no distension.     Palpations: Abdomen is soft. There is no mass.  Musculoskeletal:        General: No swelling.  Skin:    General: Skin is warm.  Neurological:     General: No focal deficit present.     Mental Status: She is  alert and oriented to person, place, and time.  Psychiatric:        Mood and Affect: Mood normal.        Behavior: Behavior normal.      LABORATORY DATA:  I have reviewed the labs as listed.  CBC    Component Value Date/Time   WBC 8.2 04/28/2019 0832   RBC 4.21 04/28/2019 0832   HGB 12.9 04/28/2019 0832  HCT 40.0 04/28/2019 0832   PLT 446 (H) 04/28/2019 0832   MCV 95.0 04/28/2019 0832   MCH 30.6 04/28/2019 0832   MCHC 32.3 04/28/2019 0832   RDW 15.3 04/28/2019 0832   LYMPHSABS 1.9 04/28/2019 0832   MONOABS 0.7 04/28/2019 0832   EOSABS 0.1 04/28/2019 0832   BASOSABS 0.1 04/28/2019 0832   CMP Latest Ref Rng & Units 04/28/2019 04/03/2019 03/08/2019  Glucose 70 - 99 mg/dL 106(H) 109(H) 115(H)  BUN 8 - 23 mg/dL 12 11 9   Creatinine 0.44 - 1.00 mg/dL 0.90 0.86 0.70  Sodium 135 - 145 mmol/L 138 141 139  Potassium 3.5 - 5.1 mmol/L 3.8 3.4(L) 4.1  Chloride 98 - 111 mmol/L 100 101 96(L)  CO2 22 - 32 mmol/L 25 26 32  Calcium 8.9 - 10.3 mg/dL 10.1 9.7 9.7  Total Protein 6.5 - 8.1 g/dL 7.6 7.7 -  Total Bilirubin 0.3 - 1.2 mg/dL <0.1(L) 0.3 -  Alkaline Phos 38 - 126 U/L 94 96 -  AST 15 - 41 U/L 21 16 -  ALT 0 - 44 U/L 17 9 -       DIAGNOSTIC IMAGING:  I have independently reviewed the scans and discussed with the patient.   I have reviewed Venita Lick LPN's note and agree with the documentation.  I personally performed a face-to-face visit, made revisions and my assessment and plan is as follows.    ASSESSMENT & PLAN:   Primary lung adenocarcinoma, right ([place]) 1.  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  No evidence of hypermetabolic metastatic disease. - Right upper lobe wedge and right lower lobe segmentectomy and lymph node dissection on 02/27/2019, pathology showing 3.3 cm invasive adenocarcinoma of the right lower  lobe, positive visceral pleural invasion.  2.7 cm invasive adenocarcinoma in the right upper lobe with no pleural invasion.  11 mediastinal and hilar lymph nodes were negative.  Pathological staging is PT4PN0.  Lymphovascular invasion not identified.  All margins are negative. - Cycle 1 of carboplatin and pemetrexed on 04/04/2019. - She has experienced 3 days of tiredness after chemotherapy.  She also had some mouth sores which are gone at this time. - She called our office and we called in Zofran which worked better than Compazine. - We have reviewed her blood work.  She may proceed with cycle 2 without any dose modifications. -We will see her back in 3 weeks for follow-up.  2.  Anxiety: - She is taking Ativan 0.5 mg at bedtime as needed.  She is taking one fourth of the same pill during daytime if needed. -She has tried Xanax which made her more depressed.  Total time spent is 25 minutes with more than 50% of the time spent face-to-face discussing treatment plan, counseling and coordination of care.    Orders placed this encounter:  No orders of the defined types were placed in this encounter.     Derek Jack, MD [place] [phone]

## 2019-04-28 NOTE — Progress Notes (Signed)
0925 Labs reviewed with and pt seen by Dr. Delton Coombes and pt approved for chemo tx today per MD                            Madeline Gates tolerated chemo tx well without complaints or incident. Peripheral IV site checked by 2 RN's with positive blood return noted prior to and after each infusion. VSS upon discharge. Pt discharged self ambulatory in satisfactory condition

## 2019-05-12 ENCOUNTER — Other Ambulatory Visit: Payer: Self-pay | Admitting: Physician Assistant

## 2019-05-20 ENCOUNTER — Inpatient Hospital Stay (HOSPITAL_COMMUNITY): Payer: BC Managed Care – PPO | Admitting: Hematology

## 2019-05-20 ENCOUNTER — Encounter (HOSPITAL_COMMUNITY): Payer: Self-pay | Admitting: Hematology

## 2019-05-20 ENCOUNTER — Inpatient Hospital Stay (HOSPITAL_COMMUNITY): Payer: BC Managed Care – PPO | Attending: Hematology

## 2019-05-20 ENCOUNTER — Inpatient Hospital Stay (HOSPITAL_COMMUNITY): Payer: BC Managed Care – PPO

## 2019-05-20 ENCOUNTER — Other Ambulatory Visit: Payer: Self-pay

## 2019-05-20 VITALS — BP 92/55 | HR 65 | Temp 97.9°F | Resp 18

## 2019-05-20 DIAGNOSIS — C3411 Malignant neoplasm of upper lobe, right bronchus or lung: Secondary | ICD-10-CM | POA: Diagnosis present

## 2019-05-20 DIAGNOSIS — Z5111 Encounter for antineoplastic chemotherapy: Secondary | ICD-10-CM | POA: Insufficient documentation

## 2019-05-20 DIAGNOSIS — C3491 Malignant neoplasm of unspecified part of right bronchus or lung: Secondary | ICD-10-CM

## 2019-05-20 LAB — CBC WITH DIFFERENTIAL/PLATELET
Abs Immature Granulocytes: 0.05 10*3/uL (ref 0.00–0.07)
Basophils Absolute: 0 10*3/uL (ref 0.0–0.1)
Basophils Relative: 0 %
Eosinophils Absolute: 0.1 10*3/uL (ref 0.0–0.5)
Eosinophils Relative: 1 %
HCT: 38.4 % (ref 36.0–46.0)
Hemoglobin: 12.2 g/dL (ref 12.0–15.0)
Immature Granulocytes: 1 %
Lymphocytes Relative: 27 %
Lymphs Abs: 2.5 10*3/uL (ref 0.7–4.0)
MCH: 30.5 pg (ref 26.0–34.0)
MCHC: 31.8 g/dL (ref 30.0–36.0)
MCV: 96 fL (ref 80.0–100.0)
Monocytes Absolute: 0.7 10*3/uL (ref 0.1–1.0)
Monocytes Relative: 8 %
Neutro Abs: 5.7 10*3/uL (ref 1.7–7.7)
Neutrophils Relative %: 63 %
Platelets: 443 10*3/uL — ABNORMAL HIGH (ref 150–400)
RBC: 4 MIL/uL (ref 3.87–5.11)
RDW: 16.7 % — ABNORMAL HIGH (ref 11.5–15.5)
WBC: 9.1 10*3/uL (ref 4.0–10.5)
nRBC: 0 % (ref 0.0–0.2)

## 2019-05-20 LAB — COMPREHENSIVE METABOLIC PANEL
ALT: 13 U/L (ref 0–44)
AST: 20 U/L (ref 15–41)
Albumin: 4.3 g/dL (ref 3.5–5.0)
Alkaline Phosphatase: 93 U/L (ref 38–126)
Anion gap: 10 (ref 5–15)
BUN: 9 mg/dL (ref 8–23)
CO2: 27 mmol/L (ref 22–32)
Calcium: 9.9 mg/dL (ref 8.9–10.3)
Chloride: 103 mmol/L (ref 98–111)
Creatinine, Ser: 0.94 mg/dL (ref 0.44–1.00)
GFR calc Af Amer: >60 mL/min (ref >60)
GFR calc non Af Amer: >60 mL/min (ref >60)
Glucose, Bld: 102 mg/dL — ABNORMAL HIGH (ref 70–99)
Potassium: 4.4 mmol/L (ref 3.5–5.1)
Sodium: 140 mmol/L (ref 135–145)
Total Bilirubin: 0.4 mg/dL (ref 0.3–1.2)
Total Protein: 7.9 g/dL (ref 6.5–8.1)

## 2019-05-20 MED ORDER — CYANOCOBALAMIN 1000 MCG/ML IJ SOLN
1000.0000 ug | Freq: Once | INTRAMUSCULAR | Status: AC
  Administered 2019-05-20: 11:00:00 1000 ug via INTRAMUSCULAR
  Filled 2019-05-20: qty 1

## 2019-05-20 MED ORDER — SODIUM CHLORIDE 0.9 % IV SOLN
354.0000 mg | Freq: Once | INTRAVENOUS | Status: AC
  Administered 2019-05-20: 12:00:00 350 mg via INTRAVENOUS
  Filled 2019-05-20: qty 35

## 2019-05-20 MED ORDER — PALONOSETRON HCL INJECTION 0.25 MG/5ML
0.2500 mg | Freq: Once | INTRAVENOUS | Status: AC
  Administered 2019-05-20: 0.25 mg via INTRAVENOUS
  Filled 2019-05-20: qty 5

## 2019-05-20 MED ORDER — SODIUM CHLORIDE 0.9 % IV SOLN
475.0000 mg/m2 | Freq: Once | INTRAVENOUS | Status: AC
  Administered 2019-05-20: 700 mg via INTRAVENOUS
  Filled 2019-05-20: qty 8

## 2019-05-20 MED ORDER — SODIUM CHLORIDE 0.9 % IV SOLN
Freq: Once | INTRAVENOUS | Status: AC
  Administered 2019-05-20: 11:00:00 via INTRAVENOUS
  Filled 2019-05-20: qty 5

## 2019-05-20 MED ORDER — SODIUM CHLORIDE 0.9 % IV SOLN
Freq: Once | INTRAVENOUS | Status: AC
  Administered 2019-05-20: 10:00:00 via INTRAVENOUS

## 2019-05-20 MED ORDER — SODIUM CHLORIDE 0.9% FLUSH
10.0000 mL | INTRAVENOUS | Status: DC | PRN
  Administered 2019-05-20: 10 mL
  Filled 2019-05-20: qty 10

## 2019-05-20 MED ORDER — HEPARIN SOD (PORK) LOCK FLUSH 100 UNIT/ML IV SOLN: 500.0000 [IU] | Freq: Once | INTRAVENOUS | Status: DC | PRN

## 2019-05-20 NOTE — Progress Notes (Signed)
[address] [place], [place] [address]   CLINIC:  Medical Oncology/Hematology  PCP:  Dione Housekeeper, [place] [address] [phone]   REASON FOR VISIT:  Follow-up for right lung cancer.    INTERVAL HISTORY:  Madeline Gates 63 y.o. female seen for cycle 3 of chemotherapy for her right lung cancer.  Appetite and energy levels are 75%.  She had nausea which lasted about 6 days.  She vomited once in those 6 days.  She took Compazine which did not help with nausea.  Zofran helped slightly.  She felt better in the last 2 weeks.  She was able to work from home.  Denies any fevers or night sweats.  She is taking folic acid daily.  No mouth sores reported.  No diarrhea or constipation.  Appetite has been stable.  Anxiety is controlled well with Ativan.     REVIEW OF SYSTEMS:  Review of Systems  Gastrointestinal: Positive for nausea and vomiting.  Psychiatric/Behavioral: The patient is nervous/anxious.   All other systems reviewed and are negative.    PAST MEDICAL/SURGICAL HISTORY:  Past Medical History:  Diagnosis Date  . Anxiety   . IBS (irritable bowel syndrome)   . Multiple lung nodules    Past Surgical History:  Procedure Laterality Date  . SEGMENTECOMY Right 02/27/2019   Procedure: RIGHT UPPER WEDGE AND RIGHT LOWER SEGMENTECTOMY LYMPH NODE DISSECTION;  Surgeon: [name], MD;  Location: [facility];  Service: Thoracic;  Laterality: Right;  . TONSILLECTOMY    . TUBAL LIGATION    . VIDEO ASSISTED THORACOSCOPY Right 02/27/2019   Procedure: VIDEO ASSISTED THORACOSCOPY;  Surgeon: [name], MD;  Location: [facility];  Service: Thoracic;  Laterality: Right;     SOCIAL HISTORY:  Social History   Socioeconomic History  . Marital status: Married    Spouse name: Percell Miller  . Number of children: 1  . Years of education: Not on file  . Highest education level: Not on file  Occupational History  . Occupation:  Forensic psychologist  Social Needs  . Financial resource strain: Not hard at all  . Food insecurity    Worry: Never true    Inability: Never true  . Transportation needs    Medical: Yes    Non-medical: Yes  Tobacco Use  . Smoking status: Former Smoker    Packs/day: 1.00    Types: Cigarettes    Quit date: 01/26/2019    Years since quitting: 0.3  . Smokeless tobacco: Never Used  Substance and Sexual Activity  . Alcohol use: Not Currently  . Drug use: Not Currently  . Sexual activity: Not on file  Lifestyle  . Physical activity    Days per week: 2 days    Minutes per session: 20 min  . Stress: Rather much  Relationships  . Social connections    Talks on phone: More than three times a week    Gets together: Twice a week    Attends religious service: Never    Active member of club or organization: No    Attends meetings of clubs or organizations: Never    Relationship status: Married  . Intimate partner violence    Fear of current or ex partner: No    Emotionally abused: No    Physically abused: No    Forced sexual activity: No  Other Topics Concern  . Not on file  Social History Narrative  . Not on file  FAMILY HISTORY:  Family History  Problem Relation Age of Onset  . Myelodysplastic syndrome Mother   . Clotting disorder Father   . Lung cancer Paternal Aunt     CURRENT MEDICATIONS:  Outpatient Encounter Medications as of 05/20/2019  Medication Sig  . Calcium Carb-Cholecalciferol (CALCIUM 600+D3) 600-800 MG-UNIT TABS Take 1 tablet by mouth daily.  Marland Kitchen CARBOPLATIN IV Inject into the vein every 21 ( twenty-one) days.  Marland Kitchen dicyclomine (BENTYL) 20 MG tablet Take 20 mg by mouth as needed.   . folic acid (FOLVITE) 1 MG tablet Take 1 tablet (1 mg total) by mouth daily.  Marland Kitchen loratadine (CLARITIN) 10 MG tablet Take 10 mg by mouth daily.  Marland Kitchen LORazepam (ATIVAN) 0.5 MG tablet Take 1 tablet (0.5 mg total) by mouth every 8 (eight) hours as needed for anxiety.  . ondansetron (ZOFRAN) 4  MG tablet TAKE ONE TABLET (4 MG DOSE) BY MOUTH DAILY AS NEEDED FOR NAUSEA.  Marland Kitchen oxymetazoline (AFRIN) 0.05 % nasal spray Place 1 spray into both nostrils 2 (two) times daily as needed for congestion.  Marland Kitchen PEMEtrexed 500 mg/m2 in sodium chloride 0.9 % 100 mL Inject into the vein every 21 ( twenty-one) days.  . prochlorperazine (COMPAZINE) 10 MG tablet TAKE 1 TABLET (10 MG TOTAL) BY MOUTH EVERY 6 (SIX) HOURS AS NEEDED (NAUSEA OR VOMITING).  Marland Kitchen sertraline (ZOLOFT) 100 MG tablet Take 100 mg by mouth daily at 3 pm.   . sodium chloride (OCEAN) 0.65 % SOLN nasal spray Place 1 spray into both nostrils as needed for congestion.  . [DISCONTINUED] amiodarone (PACERONE) 200 MG tablet Take 1 tablet (200 mg total) by mouth daily.   No facility-administered encounter medications on file as of 05/20/2019.     ALLERGIES:  Allergies  Allergen Reactions  . Demerol [Meperidine Hcl] Swelling    Swelling around IV site  . Ibuprofen Diarrhea and Nausea And Vomiting     PHYSICAL EXAM:  ECOG Performance status: 0  Vitals:   05/20/19 0800  BP: 102/80  Pulse: 61  Resp: 16  Temp: (!) 97.1 F (36.2 C)  SpO2: 100%   Filed Weights   05/20/19 0800  Weight: 108 lb 1 oz (49 kg)    Physical Exam Vitals signs reviewed.  Constitutional:      Appearance: Normal appearance.  Cardiovascular:     Rate and Rhythm: Normal rate and regular rhythm.     Pulses: Normal pulses.     Heart sounds: Normal heart sounds.  Pulmonary:     Breath sounds: Normal breath sounds.  Abdominal:     General: There is no distension.     Palpations: Abdomen is soft. There is no mass.  Musculoskeletal:        General: No swelling.  Skin:    General: Skin is warm.  Neurological:     General: No focal deficit present.     Mental Status: She is alert and oriented to person, place, and time.  Psychiatric:        Mood and Affect: Mood normal.        Behavior: Behavior normal.      LABORATORY DATA:  I have reviewed the labs as  listed.  CBC    Component Value Date/Time   WBC 9.1 05/20/2019 0824   RBC 4.00 05/20/2019 0824   HGB 12.2 05/20/2019 0824   HCT 38.4 05/20/2019 0824   PLT 443 (H) 05/20/2019 0824   MCV 96.0 05/20/2019 0824   MCH 30.5 05/20/2019 0824  MCHC 31.8 05/20/2019 0824   RDW 16.7 (H) 05/20/2019 0824   LYMPHSABS 2.5 05/20/2019 0824   MONOABS 0.7 05/20/2019 0824   EOSABS 0.1 05/20/2019 0824   BASOSABS 0.0 05/20/2019 0824   CMP Latest Ref Rng & Units 05/20/2019 04/28/2019 04/03/2019  Glucose 70 - 99 mg/dL 102(H) 106(H) 109(H)  BUN 8 - 23 mg/dL 9 12 11   Creatinine 0.44 - 1.00 mg/dL 0.94 0.90 0.86  Sodium 135 - 145 mmol/L 140 138 141  Potassium 3.5 - 5.1 mmol/L 4.4 3.8 3.4(L)  Chloride 98 - 111 mmol/L 103 100 101  CO2 22 - 32 mmol/L 27 25 26   Calcium 8.9 - 10.3 mg/dL 9.9 10.1 9.7  Total Protein 6.5 - 8.1 g/dL 7.9 7.6 7.7  Total Bilirubin 0.3 - 1.2 mg/dL 0.4 <0.1(L) 0.3  Alkaline Phos 38 - 126 U/L 93 94 96  AST 15 - 41 U/L 20 21 16   ALT 0 - 44 U/L 13 17 9        DIAGNOSTIC IMAGING:  I have independently reviewed the scans and discussed with the patient.   I have reviewed Madeline Lick LPN's note and agree with the documentation.  I personally performed a face-to-face visit, made revisions and my assessment and plan is as follows.    ASSESSMENT & PLAN:   Primary lung adenocarcinoma, right ([place]) 1.  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 - Right upper lobe wedge and right lower lobe segmentectomy and lymph node dissection on 02/27/2019, pathology showing 3.3 cm invasive adenocarcinoma of the right lower lobe, positive visceral pleural invasion.  2.7 cm invasive adenocarcinoma the right upper lobe with no pleural invasion.  11 mediastinal and hilar lymph nodes were negative.  Pathological staging is PT4PN0.  Lymphovascular invasion  not identified.  Margins negative. -2 cycles of carboplatin and pemetrexed in the adjuvant setting on 04/04/2019 and 04/28/2019. - We have reviewed her labs.  She may proceed with cycle 3 without any dose modifications. -We will see her back in 3 weeks for follow-up.  2.  Anxiety: - She is taking Ativan 0.5 mg at bedtime as needed.  3.  Nausea/vomiting: - She had nausea lasting for 6 days after last cycle.  Compazine did not help.  Zofran helped better than Compazine but not completely. -I have told her to try taking half tablet of Ativan.  Total time spent is 25 minutes with more than 50% of the time spent face-to-face discussing treatment plan, counseling and coordination of care.    Orders placed this encounter:  No orders of the defined types were placed in this encounter.     Derek Jack, MD [place] [phone]

## 2019-05-20 NOTE — Assessment & Plan Note (Addendum)
1.  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 - Right upper lobe wedge and right lower lobe segmentectomy and lymph node dissection on 02/27/2019, pathology showing 3.3 cm invasive adenocarcinoma of the right lower lobe, positive visceral pleural invasion.  2.7 cm invasive adenocarcinoma the right upper lobe with no pleural invasion.  11 mediastinal and hilar lymph nodes were negative.  Pathological staging is PT4PN0.  Lymphovascular invasion not identified.  Margins negative. -2 cycles of carboplatin and pemetrexed in the adjuvant setting on 04/04/2019 and 04/28/2019. - We have reviewed her labs.  She may proceed with cycle 3 without any dose modifications. -We will see her back in 3 weeks for follow-up.  2.  Anxiety: - She is taking Ativan 0.5 mg at bedtime as needed.  3.  Nausea/vomiting: - She had nausea lasting for 6 days after last cycle.  Compazine did not help.  Zofran helped better than Compazine but not completely. -I have told her to try taking half tablet of Ativan.

## 2019-05-20 NOTE — Patient Instructions (Signed)
[facility] at [facility] Discharge Instructions  You were seen today by Dr. Katragadda. He went over your recent lab results. He will see you back in 3 weeks for labs and follow up.   Thank you for choosing [facility] at [facility] to provide your oncology and hematology care.  To afford each patient quality time with our provider, please arrive at least 15 minutes before your scheduled appointment time.   If you have a lab appointment with the Cancer Center please come in thru the  Main Entrance and check in at the main information desk  You need to re-schedule your appointment should you arrive 10 or more minutes late.  We strive to give you quality time with our providers, and arriving late affects you and other patients whose appointments are after yours.  Also, if you no show three or more times for appointments you may be dismissed from the clinic at the providers discretion.     Again, thank you for choosing [facility].  Our hope is that these requests will decrease the amount of time that you wait before being seen by our physicians.       _____________________________________________________________  Should you have questions after your visit to [facility], please contact our office at [phone] between the hours of 8:00 a.m. and 4:30 p.m.  Voicemails left after 4:00 p.m. will not be returned until the following business day.  For prescription refill requests, have your pharmacy contact our office and allow 72 hours.    Cancer Center Support Programs:   > Cancer Support Group  2nd Tuesday of the month 1pm-2pm, Journey Room    

## 2019-05-20 NOTE — Progress Notes (Signed)
Patient seen by Dr. Delton Coombes with lab review and ok to treat today verbal order.    Patient tolerated chemotherapy with no complaints voiced.  Peripheral IV site clean and dry with no bruising or swelling noted at site.  Good blood return noted before and after administration of chemotherapy.  Band aid applied.  Patient left ambulatory with VSS and no s/s of distress noted.

## 2019-06-10 ENCOUNTER — Encounter (HOSPITAL_COMMUNITY): Payer: Self-pay | Admitting: Hematology

## 2019-06-10 ENCOUNTER — Other Ambulatory Visit: Payer: Self-pay

## 2019-06-10 ENCOUNTER — Inpatient Hospital Stay (HOSPITAL_COMMUNITY): Payer: BC Managed Care – PPO

## 2019-06-10 ENCOUNTER — Inpatient Hospital Stay (HOSPITAL_BASED_OUTPATIENT_CLINIC_OR_DEPARTMENT_OTHER): Payer: BC Managed Care – PPO | Admitting: Hematology

## 2019-06-10 VITALS — BP 128/68 | HR 62 | Temp 97.1°F | Resp 18 | Wt 109.4 lb

## 2019-06-10 VITALS — BP 118/71 | HR 62 | Resp 18

## 2019-06-10 DIAGNOSIS — C3491 Malignant neoplasm of unspecified part of right bronchus or lung: Secondary | ICD-10-CM

## 2019-06-10 DIAGNOSIS — Z5111 Encounter for antineoplastic chemotherapy: Secondary | ICD-10-CM | POA: Diagnosis not present

## 2019-06-10 LAB — COMPREHENSIVE METABOLIC PANEL
ALT: 26 U/L (ref 0–44)
AST: 32 U/L (ref 15–41)
Albumin: 4.1 g/dL (ref 3.5–5.0)
Alkaline Phosphatase: 79 U/L (ref 38–126)
Anion gap: 9 (ref 5–15)
BUN: 8 mg/dL (ref 8–23)
CO2: 27 mmol/L (ref 22–32)
Calcium: 9.8 mg/dL (ref 8.9–10.3)
Chloride: 103 mmol/L (ref 98–111)
Creatinine, Ser: 0.9 mg/dL (ref 0.44–1.00)
GFR calc Af Amer: >60 mL/min (ref >60)
GFR calc non Af Amer: >60 mL/min (ref >60)
Glucose, Bld: 102 mg/dL — ABNORMAL HIGH (ref 70–99)
Potassium: 3.3 mmol/L — ABNORMAL LOW (ref 3.5–5.1)
Sodium: 139 mmol/L (ref 135–145)
Total Bilirubin: 0.4 mg/dL (ref 0.3–1.2)
Total Protein: 7.4 g/dL (ref 6.5–8.1)

## 2019-06-10 LAB — CBC WITH DIFFERENTIAL/PLATELET
Abs Immature Granulocytes: 0.02 10*3/uL (ref 0.00–0.07)
Basophils Absolute: 0 10*3/uL (ref 0.0–0.1)
Basophils Relative: 1 %
Eosinophils Absolute: 0.1 10*3/uL (ref 0.0–0.5)
Eosinophils Relative: 1 %
HCT: 35.3 % — ABNORMAL LOW (ref 36.0–46.0)
Hemoglobin: 11.5 g/dL — ABNORMAL LOW (ref 12.0–15.0)
Immature Granulocytes: 0 %
Lymphocytes Relative: 33 %
Lymphs Abs: 2.1 10*3/uL (ref 0.7–4.0)
MCH: 31.8 pg (ref 26.0–34.0)
MCHC: 32.6 g/dL (ref 30.0–36.0)
MCV: 97.5 fL (ref 80.0–100.0)
Monocytes Absolute: 0.7 10*3/uL (ref 0.1–1.0)
Monocytes Relative: 11 %
Neutro Abs: 3.5 10*3/uL (ref 1.7–7.7)
Neutrophils Relative %: 54 %
Platelets: 405 10*3/uL — ABNORMAL HIGH (ref 150–400)
RBC: 3.62 MIL/uL — ABNORMAL LOW (ref 3.87–5.11)
RDW: 17.6 % — ABNORMAL HIGH (ref 11.5–15.5)
WBC: 6.3 10*3/uL (ref 4.0–10.5)
nRBC: 0 % (ref 0.0–0.2)

## 2019-06-10 MED ORDER — PALONOSETRON HCL INJECTION 0.25 MG/5ML
0.2500 mg | Freq: Once | INTRAVENOUS | Status: AC
  Administered 2019-06-10: 0.25 mg via INTRAVENOUS

## 2019-06-10 MED ORDER — PALONOSETRON HCL INJECTION 0.25 MG/5ML
INTRAVENOUS | Status: AC
  Filled 2019-06-10: qty 5

## 2019-06-10 MED ORDER — SODIUM CHLORIDE 0.9 % IV SOLN
Freq: Once | INTRAVENOUS | Status: AC
  Administered 2019-06-10: 11:00:00 via INTRAVENOUS
  Filled 2019-06-10: qty 5

## 2019-06-10 MED ORDER — SODIUM CHLORIDE 0.9 % IV SOLN
475.0000 mg/m2 | Freq: Once | INTRAVENOUS | Status: AC
  Administered 2019-06-10: 12:00:00 700 mg via INTRAVENOUS
  Filled 2019-06-10: qty 20

## 2019-06-10 MED ORDER — SODIUM CHLORIDE 0.9 % IV SOLN
364.5000 mg | Freq: Once | INTRAVENOUS | Status: AC
  Administered 2019-06-10: 13:00:00 360 mg via INTRAVENOUS
  Filled 2019-06-10: qty 36

## 2019-06-10 MED ORDER — SODIUM CHLORIDE 0.9 % IV SOLN
Freq: Once | INTRAVENOUS | Status: AC
  Administered 2019-06-10: 11:00:00 via INTRAVENOUS

## 2019-06-10 NOTE — Patient Instructions (Addendum)
[facility] Discharge Instructions  You were seen today by Dr. Katragadda. He went over your recent lab results. He will see you back in 4 weeks for labs and follow up.   Thank you for choosing [facility] at [facility] to provide your oncology and hematology care.  To afford each patient quality time with our provider, please arrive at least 15 minutes before your scheduled appointment time.   If you have a lab appointment with the Cancer Center please come in thru the  Main Entrance and check in at the main information desk  You need to re-schedule your appointment should you arrive 10 or more minutes late.  We strive to give you quality time with our providers, and arriving late affects you and other patients whose appointments are after yours.  Also, if you no show three or more times for appointments you may be dismissed from the clinic at the providers discretion.     Again, thank you for choosing [facility].  Our hope is that these requests will decrease the amount of time that you wait before being seen by our physicians.       _____________________________________________________________  Should you have questions after your visit to [facility], please contact our office at [phone] between the hours of 8:00 a.m. and 4:30 p.m.  Voicemails left after 4:00 p.m. will not be returned until the following business day.  For prescription refill requests, have your pharmacy contact our office and allow 72 hours.    Cancer Center Support Programs:   > Cancer Support Group  2nd Tuesday of the month 1pm-2pm, Journey Room    

## 2019-06-10 NOTE — Patient Instructions (Signed)
[facility] Discharge Instructions for Patients Receiving Chemotherapy   Beginning January 23rd 2017 lab work for the [facility] will be done in the  Main lab at [facility] on 1st floor. If you have a lab appointment with the [facility] [place] please come in thru the  Main Entrance and check in at the main information desk   Today you received the following chemotherapy agents Alimta and Carboplatin  To help prevent nausea and vomiting after your treatment, we encourage you to take your nausea medication  If you develop nausea and vomiting, or diarrhea that is not controlled by your medication, call the clinic.  The clinic phone number is [phone]. Office hours are Monday-Friday 8:30am-5:00pm.  BELOW ARE SYMPTOMS THAT SHOULD BE REPORTED IMMEDIATELY:  *FEVER GREATER THAN 101.0 F  *CHILLS WITH OR WITHOUT FEVER  NAUSEA AND VOMITING THAT IS NOT CONTROLLED WITH YOUR NAUSEA MEDICATION  *UNUSUAL SHORTNESS OF BREATH  *UNUSUAL BRUISING OR BLEEDING  TENDERNESS IN MOUTH AND THROAT WITH OR WITHOUT PRESENCE OF ULCERS  *URINARY PROBLEMS  *BOWEL PROBLEMS  UNUSUAL RASH Items with * indicate a potential emergency and should be followed up as soon as possible. If you have an emergency after office hours please contact your primary care physician or go to the nearest emergency department.  Please call the clinic during office hours if you have any questions or concerns.   You may also contact the Patient Navigator at [phone] should you have any questions or need assistance in obtaining follow up care.      Resources For Cancer Patients and their Caregivers ? American Cancer Society: Can assist with transportation, wigs, general needs, runs Look Good Feel Better.        810-748-4808 ? Cancer Care: Provides financial assistance, online support groups, medication/co-pay assistance.  1-800-813-HOPE 703-591-9000) ? [facility]  [place] cancer patients and their families through emotional , educational and financial support.  (579)029-3100 ? [place] DSS Where to apply for food stamps, Medicaid and utility assistance. 680-530-5119 ? [facility]: Transportation to medical appointments. 325-134-8097 ? Social Security Administration: May apply for disability if have a Stage IV cancer. 304-369-0186 310-267-2376 ? [employer], Disability and Transit Services: Assists with nutrition, care and transit needs. ([phone]

## 2019-06-10 NOTE — Assessment & Plan Note (Signed)
1.  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 -Right upper lobe wedge and right lower lobe segmentectomy and lymph node dissection on 02/27/2019, pathology showing 3.3 cm, invasive adenocarcinoma the right lower lobe, positive visceral pleural invasion.  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  11 mediastinal and hilar lymph nodes are negative.  Pathological staging is PT4PN0.  Lymphovascular invasion is negative.  Margins negative. -3 cycles of carboplatin and pemetrexed in the adjuvant setting from 04/04/2019 through 05/20/2019. -She has tolerated her last cycle reasonably well.  She did experience some nausea and vomiting. -We have reviewed her blood work.  She will proceed with her final treatment today. -We will see her back in 4 weeks for follow-up.  We will plan to repeat CT chest with contrast prior to next visit.  2.  Anxiety: -She is taking Ativan 0.5 mg at bedtime as needed.  3.  Nausea/vomiting: - Compazine did not help.  Zofran helped but not completely. -She is taking half tablet of Ativan as needed which is helping.

## 2019-06-10 NOTE — Progress Notes (Signed)
[address] [place], [place] [address]   CLINIC:  Medical Oncology/Hematology  PCP:  Dione Housekeeper, [place] [id number] [phone]   REASON FOR VISIT:  Follow-up for right lung cancer.    INTERVAL HISTORY:  Madeline Gates 63 y.o. female seen for cycle 4 of chemotherapy.  She has completed cycle 3 on 05/20/2019.  Appetite and energy levels are 75%.  She did report some nausea and vomiting after last treatment.  However she took Ativan in addition to Zofran which helped.  Denies any fevers or night sweats.  Denies any major weakness.  Denies any ER visits or hospitalizations.     REVIEW OF SYSTEMS:  Review of Systems  Gastrointestinal: Positive for nausea and vomiting.  Psychiatric/Behavioral: The patient is nervous/anxious.   All other systems reviewed and are negative.    PAST MEDICAL/SURGICAL HISTORY:  Past Medical History:  Diagnosis Date  . Anxiety   . IBS (irritable bowel syndrome)   . Multiple lung nodules    Past Surgical History:  Procedure Laterality Date  . SEGMENTECOMY Right 02/27/2019   Procedure: RIGHT UPPER WEDGE AND RIGHT LOWER SEGMENTECTOMY LYMPH NODE DISSECTION;  Surgeon: [name], MD;  Location: [place];  Service: Thoracic;  Laterality: Right;  . TONSILLECTOMY    . TUBAL LIGATION    . VIDEO ASSISTED THORACOSCOPY Right 02/27/2019   Procedure: VIDEO ASSISTED THORACOSCOPY;  Surgeon: [name], MD;  Location: [place];  Service: Thoracic;  Laterality: Right;     SOCIAL HISTORY:  Social History   Socioeconomic History  . Marital status: Married    Spouse name: Percell Miller  . Number of children: 1  . Years of education: Not on file  . Highest education level: Not on file  Occupational History  . Occupation: Forensic psychologist  Social Needs  . Financial resource strain: Not hard at all  . Food insecurity    Worry: Never true    Inability: Never true  . Transportation needs   Medical: Yes    Non-medical: Yes  Tobacco Use  . Smoking status: Former Smoker    Packs/day: 1.00    Types: Cigarettes    Quit date: 01/26/2019    Years since quitting: 0.3  . Smokeless tobacco: Never Used  Substance and Sexual Activity  . Alcohol use: Not Currently  . Drug use: Not Currently  . Sexual activity: Not on file  Lifestyle  . Physical activity    Days per week: 2 days    Minutes per session: 20 min  . Stress: Rather much  Relationships  . Social connections    Talks on phone: More than three times a week    Gets together: Twice a week    Attends religious service: Never    Active member of club or organization: No    Attends meetings of clubs or organizations: Never    Relationship status: Married  . Intimate partner violence    Fear of current or ex partner: No    Emotionally abused: No    Physically abused: No    Forced sexual activity: No  Other Topics Concern  . Not on file  Social History Narrative  . Not on file    FAMILY HISTORY:  Family History  Problem Relation Age of Onset  . Myelodysplastic syndrome Mother   . Clotting disorder Father   . Lung cancer Paternal Aunt     CURRENT MEDICATIONS:  Outpatient Encounter Medications as of  06/10/2019  Medication Sig  . Calcium Carb-Cholecalciferol (CALCIUM 600+D3) 600-800 MG-UNIT TABS Take 1 tablet by mouth daily.  Marland Kitchen CARBOPLATIN IV Inject into the vein every 21 ( twenty-one) days.  Marland Kitchen dicyclomine (BENTYL) 20 MG tablet Take 20 mg by mouth as needed.   . folic acid (FOLVITE) 1 MG tablet Take 1 tablet (1 mg total) by mouth daily.  Marland Kitchen loratadine (CLARITIN) 10 MG tablet Take 10 mg by mouth as needed.   Marland Kitchen LORazepam (ATIVAN) 0.5 MG tablet Take 1 tablet (0.5 mg total) by mouth every 8 (eight) hours as needed for anxiety.  . ondansetron (ZOFRAN) 4 MG tablet TAKE ONE TABLET (4 MG DOSE) BY MOUTH DAILY AS NEEDED FOR NAUSEA.  Marland Kitchen oxymetazoline (AFRIN) 0.05 % nasal spray Place 1 spray into both nostrils 2 (two) times  daily as needed for congestion.  Marland Kitchen PEMEtrexed 500 mg/m2 in sodium chloride 0.9 % 100 mL Inject into the vein every 21 ( twenty-one) days.  . prochlorperazine (COMPAZINE) 10 MG tablet TAKE 1 TABLET (10 MG TOTAL) BY MOUTH EVERY 6 (SIX) HOURS AS NEEDED (NAUSEA OR VOMITING).  Marland Kitchen sertraline (ZOLOFT) 100 MG tablet Take 100 mg by mouth daily at 3 pm.   . sodium chloride (OCEAN) 0.65 % SOLN nasal spray Place 1 spray into both nostrils as needed for congestion.   No facility-administered encounter medications on file as of 06/10/2019.     ALLERGIES:  Allergies  Allergen Reactions  . Demerol [Meperidine Hcl] Swelling    Swelling around IV site  . Ibuprofen Diarrhea and Nausea And Vomiting     PHYSICAL EXAM:  ECOG Performance status: 0  Vitals:   06/10/19 1009  BP: 128/68  Pulse: 62  Resp: 18  Temp: (!) 97.1 F (36.2 C)  SpO2: 100%   Filed Weights   06/10/19 1009  Weight: 109 lb 6.4 oz (49.6 kg)    Physical Exam Vitals signs reviewed.  Constitutional:      Appearance: Normal appearance.  Cardiovascular:     Rate and Rhythm: Normal rate and regular rhythm.     Pulses: Normal pulses.     Heart sounds: Normal heart sounds.  Pulmonary:     Breath sounds: Normal breath sounds.  Abdominal:     General: There is no distension.     Palpations: Abdomen is soft. There is no mass.  Musculoskeletal:        General: No swelling.  Skin:    General: Skin is warm.  Neurological:     General: No focal deficit present.     Mental Status: She is alert and oriented to person, place, and time.  Psychiatric:        Mood and Affect: Mood normal.        Behavior: Behavior normal.      LABORATORY DATA:  I have reviewed the labs as listed.  CBC    Component Value Date/Time   WBC 6.3 06/10/2019 0831   RBC 3.62 (L) 06/10/2019 0831   HGB 11.5 (L) 06/10/2019 0831   HCT 35.3 (L) 06/10/2019 0831   PLT 405 (H) 06/10/2019 0831   MCV 97.5 06/10/2019 0831   MCH 31.8 06/10/2019 0831   MCHC  32.6 06/10/2019 0831   RDW 17.6 (H) 06/10/2019 0831   LYMPHSABS 2.1 06/10/2019 0831   MONOABS 0.7 06/10/2019 0831   EOSABS 0.1 06/10/2019 0831   BASOSABS 0.0 06/10/2019 0831   CMP Latest Ref Rng & Units 06/10/2019 05/20/2019 04/28/2019  Glucose 70 - 99 mg/dL 102(H)  102(H) 106(H)  BUN 8 - 23 mg/dL 8 9 12   Creatinine 0.44 - 1.00 mg/dL 0.90 0.94 0.90  Sodium 135 - 145 mmol/L 139 140 138  Potassium 3.5 - 5.1 mmol/L 3.3(L) 4.4 3.8  Chloride 98 - 111 mmol/L 103 103 100  CO2 22 - 32 mmol/L 27 27 25   Calcium 8.9 - 10.3 mg/dL 9.8 9.9 10.1  Total Protein 6.5 - 8.1 g/dL 7.4 7.9 7.6  Total Bilirubin 0.3 - 1.2 mg/dL 0.4 0.4 <0.1(L)  Alkaline Phos 38 - 126 U/L 79 93 94  AST 15 - 41 U/L 32 20 21  ALT 0 - 44 U/L 26 13 17        DIAGNOSTIC IMAGING:  I have independently reviewed the scans and discussed with the patient.   I have reviewed Venita Lick LPN's note and agree with the documentation.  I personally performed a face-to-face visit, made revisions and my assessment and plan is as follows.    ASSESSMENT & PLAN:   Primary lung adenocarcinoma, right ([place]) 1.  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 -Right upper lobe wedge and right lower lobe segmentectomy and lymph node dissection on 02/27/2019, pathology showing 3.3 cm, invasive adenocarcinoma the right lower lobe, positive visceral pleural invasion.  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  11 mediastinal and hilar lymph nodes are negative.  Pathological staging is PT4PN0.  Lymphovascular invasion is negative.  Margins negative. -3 cycles of carboplatin and pemetrexed in the adjuvant setting from 04/04/2019 through 05/20/2019. -She has tolerated her last cycle reasonably well.  She did experience some nausea and vomiting. -We have reviewed her blood work.  She will proceed  with her final treatment today. -We will see her back in 4 weeks for follow-up.  We will plan to repeat CT chest with contrast prior to next visit.  2.  Anxiety: -She is taking Ativan 0.5 mg at bedtime as needed.  3.  Nausea/vomiting: - Compazine did not help.  Zofran helped but not completely. -She is taking half tablet of Ativan as needed which is helping.  Total time spent is 25 minutes with more than 50% of the time spent face-to-face discussing treatment plan, counseling and coordination of care.    Orders placed this encounter:  Orders Placed This Encounter  Procedures  . CT Chest W Contrast      Derek Jack, MD [place] [phone]

## 2019-06-10 NOTE — Progress Notes (Signed)
Madeline Gates presents today for cycle 4 Carbo/Alimta. Per MD, ok to proceed with treatment today. Patient tolerated treatment without incident or complaint. Peripheral IV noted for positive blood return prior to, during, and post treatment by 2 RNs. VSS. Discharged self ambulatory in satisfactory condition.

## 2019-07-01 ENCOUNTER — Other Ambulatory Visit (HOSPITAL_COMMUNITY): Payer: Self-pay | Admitting: Hematology

## 2019-07-03 ENCOUNTER — Ambulatory Visit (HOSPITAL_COMMUNITY)
Admission: RE | Admit: 2019-07-03 | Discharge: 2019-07-03 | Disposition: A | Discharge status: Home / Self Care | Payer: BC Managed Care – PPO | Source: Ambulatory Visit | Attending: Hematology | Admitting: Hematology

## 2019-07-03 ENCOUNTER — Other Ambulatory Visit: Payer: Self-pay

## 2019-07-03 ENCOUNTER — Inpatient Hospital Stay (HOSPITAL_COMMUNITY): Payer: BC Managed Care – PPO | Attending: Hematology

## 2019-07-03 DIAGNOSIS — C3491 Malignant neoplasm of unspecified part of right bronchus or lung: Secondary | ICD-10-CM | POA: Insufficient documentation

## 2019-07-03 LAB — COMPREHENSIVE METABOLIC PANEL
ALT: 20 U/L (ref 0–44)
AST: 26 U/L (ref 15–41)
Albumin: 4.4 g/dL (ref 3.5–5.0)
Alkaline Phosphatase: 76 U/L (ref 38–126)
Anion gap: 11 (ref 5–15)
BUN: 13 mg/dL (ref 8–23)
CO2: 25 mmol/L (ref 22–32)
Calcium: 9.3 mg/dL (ref 8.9–10.3)
Chloride: 101 mmol/L (ref 98–111)
Creatinine, Ser: 0.97 mg/dL (ref 0.44–1.00)
GFR calc Af Amer: >60 mL/min (ref >60)
GFR calc non Af Amer: >60 mL/min (ref >60)
Glucose, Bld: 99 mg/dL (ref 70–99)
Potassium: 4.3 mmol/L (ref 3.5–5.1)
Sodium: 137 mmol/L (ref 135–145)
Total Bilirubin: 0.2 mg/dL — ABNORMAL LOW (ref 0.3–1.2)
Total Protein: 7.5 g/dL (ref 6.5–8.1)

## 2019-07-03 LAB — CBC WITH DIFFERENTIAL/PLATELET
Abs Immature Granulocytes: 0.02 10*3/uL (ref 0.00–0.07)
Basophils Absolute: 0 10*3/uL (ref 0.0–0.1)
Basophils Relative: 0 %
Eosinophils Absolute: 0 10*3/uL (ref 0.0–0.5)
Eosinophils Relative: 1 %
HCT: 34.8 % — ABNORMAL LOW (ref 36.0–46.0)
Hemoglobin: 11.3 g/dL — ABNORMAL LOW (ref 12.0–15.0)
Immature Granulocytes: 0 %
Lymphocytes Relative: 31 %
Lymphs Abs: 2.3 10*3/uL (ref 0.7–4.0)
MCH: 32.8 pg (ref 26.0–34.0)
MCHC: 32.5 g/dL (ref 30.0–36.0)
MCV: 101.2 fL — ABNORMAL HIGH (ref 80.0–100.0)
Monocytes Absolute: 0.7 10*3/uL (ref 0.1–1.0)
Monocytes Relative: 10 %
Neutro Abs: 4.1 10*3/uL (ref 1.7–7.7)
Neutrophils Relative %: 58 %
Platelets: 429 10*3/uL — ABNORMAL HIGH (ref 150–400)
RBC: 3.44 MIL/uL — ABNORMAL LOW (ref 3.87–5.11)
RDW: 18.8 % — ABNORMAL HIGH (ref 11.5–15.5)
WBC: 7.2 10*3/uL (ref 4.0–10.5)
nRBC: 0 % (ref 0.0–0.2)

## 2019-07-03 MED ORDER — IOHEXOL 300 MG/ML  SOLN
75.0000 mL | Freq: Once | INTRAMUSCULAR | Status: AC | PRN
  Administered 2019-07-03: 10:00:00 75 mL via INTRAVENOUS

## 2019-07-07 ENCOUNTER — Encounter (HOSPITAL_COMMUNITY): Payer: Self-pay | Admitting: Hematology

## 2019-07-07 ENCOUNTER — Other Ambulatory Visit: Payer: Self-pay

## 2019-07-07 ENCOUNTER — Inpatient Hospital Stay (HOSPITAL_COMMUNITY): Payer: BC Managed Care – PPO | Admitting: Hematology

## 2019-07-07 VITALS — BP 121/61 | HR 84 | Temp 97.3°F | Resp 14 | Wt 111.6 lb

## 2019-07-07 DIAGNOSIS — C3491 Malignant neoplasm of unspecified part of right bronchus or lung: Secondary | ICD-10-CM

## 2019-07-07 NOTE — Progress Notes (Signed)
Madeline Gates, [place] [address]   CLINIC:  Medical Oncology/Hematology  PCP:  Dione Housekeeper, [place] [phone]   REASON FOR VISIT:  Follow-up for right lung cancer.    INTERVAL HISTORY:  Madeline Gates 63 y.o. female seen for follow-up of right lung cancer.  She has quit smoking.  Her energy levels are improving.  Last cycle of chemotherapy was on 06/10/2019.  She did scans on 07/03/2019.  Appetite is 100%.  Energy levels are 75 to 100%.  She did not lose hair with chemotherapy but had some thinning.  Denies any nausea, vomiting, diarrhea or constipation.  Denies any infections or hospitalizations.  No new cough or hemoptysis reported.  No chest pains reported.     REVIEW OF SYSTEMS:  Review of Systems  All other systems reviewed and are negative.    PAST MEDICAL/SURGICAL HISTORY:  Past Medical History:  Diagnosis Date  . Anxiety   . IBS (irritable bowel syndrome)   . Multiple lung nodules    Past Surgical History:  Procedure Laterality Date  . SEGMENTECOMY Right 02/27/2019   Procedure: RIGHT UPPER WEDGE AND RIGHT LOWER SEGMENTECTOMY LYMPH NODE DISSECTION;  Surgeon: [name], MD;  Location: [facility];  Service: Thoracic;  Laterality: Right;  . TONSILLECTOMY    . TUBAL LIGATION    . VIDEO ASSISTED THORACOSCOPY Right 02/27/2019   Procedure: VIDEO ASSISTED THORACOSCOPY;  Surgeon: [name], MD;  Location: [place];  Service: Thoracic;  Laterality: Right;     SOCIAL HISTORY:  Social History   Socioeconomic History  . Marital status: Married    Spouse name: Percell Miller  . Number of children: 1  . Years of education: Not on file  . Highest education level: Not on file  Occupational History  . Occupation: Forensic psychologist  Social Needs  . Financial resource strain: Not hard at all  . Food insecurity    Worry: Never true    Inability: Never true  . Transportation needs    Medical:  Yes    Non-medical: Yes  Tobacco Use  . Smoking status: Former Smoker    Packs/day: 1.00    Types: Cigarettes    Quit date: 01/26/2019    Years since quitting: 0.4  . Smokeless tobacco: Never Used  Substance and Sexual Activity  . Alcohol use: Not Currently  . Drug use: Not Currently  . Sexual activity: Not on file  Lifestyle  . Physical activity    Days per week: 2 days    Minutes per session: 20 min  . Stress: Rather much  Relationships  . Social connections    Talks on phone: More than three times a week    Gets together: Twice a week    Attends religious service: Never    Active member of club or organization: No    Attends meetings of clubs or organizations: Never    Relationship status: Married  . Intimate partner violence    Fear of current or ex partner: No    Emotionally abused: No    Physically abused: No    Forced sexual activity: No  Other Topics Concern  . Not on file  Social History Narrative  . Not on file    FAMILY HISTORY:  Family History  Problem Relation Age of Onset  . Myelodysplastic syndrome Mother   . Clotting disorder Father   . Lung cancer Paternal Aunt     CURRENT  MEDICATIONS:  Outpatient Encounter Medications as of 07/07/2019  Medication Sig  . BIOTIN PO Take 1 tablet by mouth daily.  . Multiple Vitamins-Minerals (MULTIVITAMIN ADULT PO) Take 1 tablet by mouth. Pt takes twice a week  . Calcium Carb-Cholecalciferol (CALCIUM 600+D3) 600-800 MG-UNIT TABS Take 1 tablet by mouth daily.  Marland Kitchen CARBOPLATIN IV Inject into the vein every 21 ( twenty-one) days.  Marland Kitchen dicyclomine (BENTYL) 20 MG tablet Take 20 mg by mouth as needed.   . loratadine (CLARITIN) 10 MG tablet Take 10 mg by mouth as needed.   Marland Kitchen LORazepam (ATIVAN) 0.5 MG tablet Take 1 tablet (0.5 mg total) by mouth every 8 (eight) hours as needed for anxiety.  . ondansetron (ZOFRAN) 4 MG tablet TAKE ONE TABLET (4 MG DOSE) BY MOUTH DAILY AS NEEDED FOR NAUSEA.  Marland Kitchen oxymetazoline (AFRIN) 0.05 % nasal  spray Place 1 spray into both nostrils 2 (two) times daily as needed for congestion.  Marland Kitchen PEMEtrexed 500 mg/m2 in sodium chloride 0.9 % 100 mL Inject into the vein every 21 ( twenty-one) days.  . prochlorperazine (COMPAZINE) 10 MG tablet TAKE 1 TABLET (10 MG TOTAL) BY MOUTH EVERY 6 (SIX) HOURS AS NEEDED (NAUSEA OR VOMITING).  Marland Kitchen sertraline (ZOLOFT) 100 MG tablet Take 100 mg by mouth daily at 3 pm.   . sodium chloride (OCEAN) 0.65 % SOLN nasal spray Place 1 spray into both nostrils as needed for congestion.  . [DISCONTINUED] folic acid (FOLVITE) 1 MG tablet TAKE 1 TABLET BY MOUTH EVERY DAY (Patient not taking: Reported on 07/07/2019)   No facility-administered encounter medications on file as of 07/07/2019.     ALLERGIES:  Allergies  Allergen Reactions  . Demerol [Meperidine Hcl] Swelling    Swelling around IV site  . Ibuprofen Diarrhea and Nausea And Vomiting     PHYSICAL EXAM:  ECOG Performance status: 0  Vitals:   07/07/19 1044  BP: 121/61  Pulse: 84  Resp: 14  Temp: (!) 97.3 F (36.3 C)  SpO2: 100%   Filed Weights   07/07/19 1044  Weight: 111 lb 9.6 oz (50.6 kg)    Physical Exam Vitals signs reviewed.  Constitutional:      Appearance: Normal appearance.  Cardiovascular:     Rate and Rhythm: Normal rate and regular rhythm.     Pulses: Normal pulses.     Heart sounds: Normal heart sounds.  Pulmonary:     Breath sounds: Normal breath sounds.  Abdominal:     General: There is no distension.     Palpations: Abdomen is soft. There is no mass.  Musculoskeletal:        General: No swelling.  Skin:    General: Skin is warm.  Neurological:     General: No focal deficit present.     Mental Status: She is alert and oriented to person, place, and time.  Psychiatric:        Mood and Affect: Mood normal.        Behavior: Behavior normal.      LABORATORY DATA:  I have reviewed the labs as listed.  CBC    Component Value Date/Time   WBC 7.2 07/03/2019 0940   RBC 3.44  (L) 07/03/2019 0940   HGB 11.3 (L) 07/03/2019 0940   HCT 34.8 (L) 07/03/2019 0940   PLT 429 (H) 07/03/2019 0940   MCV 101.2 (H) 07/03/2019 0940   MCH 32.8 07/03/2019 0940   MCHC 32.5 07/03/2019 0940   RDW 18.8 (H) 07/03/2019 0940  LYMPHSABS 2.3 07/03/2019 0940   MONOABS 0.7 07/03/2019 0940   EOSABS 0.0 07/03/2019 0940   BASOSABS 0.0 07/03/2019 0940   CMP Latest Ref Rng & Units 07/03/2019 06/10/2019 05/20/2019  Glucose 70 - 99 mg/dL 99 102(H) 102(H)  BUN 8 - 23 mg/dL 13 8 9   Creatinine 0.44 - 1.00 mg/dL 0.97 0.90 0.94  Sodium 135 - 145 mmol/L 137 139 140  Potassium 3.5 - 5.1 mmol/L 4.3 3.3(L) 4.4  Chloride 98 - 111 mmol/L 101 103 103  CO2 22 - 32 mmol/L 25 27 27   Calcium 8.9 - 10.3 mg/dL 9.3 9.8 9.9  Total Protein 6.5 - 8.1 g/dL 7.5 7.4 7.9  Total Bilirubin 0.3 - 1.2 mg/dL 0.2(L) 0.4 0.4  Alkaline Phos 38 - 126 U/L 76 79 93  AST 15 - 41 U/L 26 32 20  ALT 0 - 44 U/L 20 26 13        DIAGNOSTIC IMAGING:  I have independently reviewed the scans and discussed with the patient.   I have reviewed Madeline Lick LPN's note and agree with the documentation.  I personally performed a face-to-face visit, made revisions and my assessment and plan is as follows.    ASSESSMENT & PLAN:   Primary lung adenocarcinoma, right ([place]) 1.  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 - Right upper lobe wedge and right lower lobe segmentectomy and lymph node dissection on 02/27/2019. -Pathology shows 3.3 cm invasive adenocarcinoma of the right lower lobe, positive visceral pleural invasion.  2.7 cm adenocarcinoma of the right upper lobe with no perineural invasion.  11 mediastinal and hilar lymph nodes are negative.  Pathological staging is PT4PN0.  Lymphovascular invasion negative.  Margins negative. - 4 cycles of adjuvant chemotherapy with carboplatin and pemetrexed  from 04/04/2019 through 06/10/2019. - She has tolerated her last cycle very well.  Her energy levels are slowly getting back to normal. - We reviewed blood work which is grossly within normal limits.  We reviewed CT chest with contrast dated 07/03/2019 which showed postsurgical changes with no cyst finding suspicious for residual/recurrent tumor at the wedge resection sites in the right upper and right lower lobe.  No findings suspicious for metastatic disease.  Tiny 3 mm sub-solid posterior right upper lobe pulmonary nodule will need follow-up. -I will see her back in 3 months with repeat CT scan.  2.  Anxiety: -She will continue Ativan 0.5 mg at bedtime as needed.  Total time spent is 25 minutes with more than 50% of the time spent face-to-face discussing surveillance plan, counseling and coordination of care.    Orders placed this encounter:  Orders Placed This Encounter  Procedures  . CT CHEST W CONTRAST  . CBC with Differential/Platelet  . Comprehensive metabolic panel      Derek Jack, MD [place] [phone]

## 2019-07-07 NOTE — Assessment & Plan Note (Signed)
1.  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 - Right upper lobe wedge and right lower lobe segmentectomy and lymph node dissection on 02/27/2019. -Pathology shows 3.3 cm invasive adenocarcinoma of the right lower lobe, positive visceral pleural invasion.  2.7 cm adenocarcinoma of the right upper lobe with no perineural invasion.  11 mediastinal and hilar lymph nodes are negative.  Pathological staging is PT4PN0.  Lymphovascular invasion negative.  Margins negative. - 4 cycles of adjuvant chemotherapy with carboplatin and pemetrexed from 04/04/2019 through 06/10/2019. - She has tolerated her last cycle very well.  Her energy levels are slowly getting back to normal. - We reviewed blood work which is grossly within normal limits.  We reviewed CT chest with contrast dated 07/03/2019 which showed postsurgical changes with no cyst finding suspicious for residual/recurrent tumor at the wedge resection sites in the right upper and right lower lobe.  No findings suspicious for metastatic disease.  Tiny 3 mm sub-solid posterior right upper lobe pulmonary nodule will need follow-up. -I will see her back in 3 months with repeat CT scan.  2.  Anxiety: -She will continue Ativan 0.5 mg at bedtime as needed.

## 2019-07-07 NOTE — Patient Instructions (Addendum)
[place] at [facility] Discharge Instructions  You were seen today by Dr. Delton Coombes. He went over your recent scan results. He will see you back in 3 months for labs, scan and follow up.   Thank you for choosing [place] at [facility] to provide your oncology and hematology care.  To afford each patient quality time with our provider, please arrive at least 15 minutes before your scheduled appointment time.   If you have a lab appointment with the [facility] please come in thru the  Main Entrance and check in at the main information desk  You need to re-schedule your appointment should you arrive 10 or more minutes late.  We strive to give you quality time with our providers, and arriving late affects you and other patients whose appointments are after yours.  Also, if you no show three or more times for appointments you may be dismissed from the clinic at the providers discretion.     Again, thank you for choosing [facility]  Our hope is that these requests will decrease the amount of time that you wait before being seen by our physicians.       _____________________________________________________________  Should you have questions after your visit to [facility], please contact our office at [phone] between the hours of 8:00 a.m. and 4:30 p.m.  Voicemails left after 4:00 p.m. will not be returned until the following business day.  For prescription refill requests, have your pharmacy contact our office and allow 72 hours.    Cancer Center Support Programs:   > Cancer Support Group  2nd Tuesday of the month 1pm-2pm, Journey Room

## 2019-10-15 ENCOUNTER — Other Ambulatory Visit: Payer: Self-pay

## 2019-10-15 ENCOUNTER — Inpatient Hospital Stay (HOSPITAL_COMMUNITY): Payer: BC Managed Care – PPO | Attending: Hematology

## 2019-10-15 ENCOUNTER — Ambulatory Visit (HOSPITAL_COMMUNITY)
Admission: RE | Admit: 2019-10-15 | Discharge: 2019-10-15 | Disposition: A | Discharge status: Home / Self Care | Payer: BC Managed Care – PPO | Source: Ambulatory Visit | Attending: Hematology | Admitting: Hematology

## 2019-10-15 DIAGNOSIS — C50911 Malignant neoplasm of unspecified site of right female breast: Secondary | ICD-10-CM | POA: Insufficient documentation

## 2019-10-15 DIAGNOSIS — C3491 Malignant neoplasm of unspecified part of right bronchus or lung: Secondary | ICD-10-CM | POA: Diagnosis present

## 2019-10-15 LAB — CBC WITH DIFFERENTIAL/PLATELET
Abs Immature Granulocytes: 0.03 10*3/uL (ref 0.00–0.07)
Basophils Absolute: 0.1 10*3/uL (ref 0.0–0.1)
Basophils Relative: 1 %
Eosinophils Absolute: 0.1 10*3/uL (ref 0.0–0.5)
Eosinophils Relative: 1 %
HCT: 40.2 % (ref 36.0–46.0)
Hemoglobin: 13.2 g/dL (ref 12.0–15.0)
Immature Granulocytes: 0 %
Lymphocytes Relative: 33 %
Lymphs Abs: 2.9 10*3/uL (ref 0.7–4.0)
MCH: 32.6 pg (ref 26.0–34.0)
MCHC: 32.8 g/dL (ref 30.0–36.0)
MCV: 99.3 fL (ref 80.0–100.0)
Monocytes Absolute: 0.5 10*3/uL (ref 0.1–1.0)
Monocytes Relative: 5 %
Neutro Abs: 5.4 10*3/uL (ref 1.7–7.7)
Neutrophils Relative %: 60 %
Platelets: 362 10*3/uL (ref 150–400)
RBC: 4.05 MIL/uL (ref 3.87–5.11)
RDW: 11.6 % (ref 11.5–15.5)
WBC: 9 10*3/uL (ref 4.0–10.5)
nRBC: 0 % (ref 0.0–0.2)

## 2019-10-15 LAB — COMPREHENSIVE METABOLIC PANEL
ALT: 6 U/L (ref 0–44)
AST: 16 U/L (ref 15–41)
Albumin: 4.1 g/dL (ref 3.5–5.0)
Alkaline Phosphatase: 60 U/L (ref 38–126)
Anion gap: 10 (ref 5–15)
BUN: 13 mg/dL (ref 8–23)
CO2: 27 mmol/L (ref 22–32)
Calcium: 9.3 mg/dL (ref 8.9–10.3)
Chloride: 103 mmol/L (ref 98–111)
Creatinine, Ser: 0.89 mg/dL (ref 0.44–1.00)
GFR calc Af Amer: >60 mL/min (ref >60)
GFR calc non Af Amer: >60 mL/min (ref >60)
Glucose, Bld: 109 mg/dL — ABNORMAL HIGH (ref 70–99)
Potassium: 3.7 mmol/L (ref 3.5–5.1)
Sodium: 140 mmol/L (ref 135–145)
Total Bilirubin: 0.3 mg/dL (ref 0.3–1.2)
Total Protein: 7.5 g/dL (ref 6.5–8.1)

## 2019-10-15 MED ORDER — IOHEXOL 300 MG/ML  SOLN
75.0000 mL | Freq: Once | INTRAMUSCULAR | Status: AC | PRN
  Administered 2019-10-15: 75 mL via INTRAVENOUS

## 2019-10-21 ENCOUNTER — Inpatient Hospital Stay (HOSPITAL_COMMUNITY): Payer: BC Managed Care – PPO | Attending: Hematology | Admitting: Hematology

## 2019-10-21 ENCOUNTER — Other Ambulatory Visit: Payer: Self-pay

## 2019-10-21 ENCOUNTER — Encounter (HOSPITAL_COMMUNITY): Payer: Self-pay | Admitting: Hematology

## 2019-10-21 VITALS — BP 106/81 | HR 72 | Temp 97.1°F | Resp 18 | Wt 119.4 lb

## 2019-10-21 DIAGNOSIS — Z905 Acquired absence of kidney: Secondary | ICD-10-CM | POA: Insufficient documentation

## 2019-10-21 DIAGNOSIS — Z79899 Other long term (current) drug therapy: Secondary | ICD-10-CM | POA: Insufficient documentation

## 2019-10-21 DIAGNOSIS — C3491 Malignant neoplasm of unspecified part of right bronchus or lung: Secondary | ICD-10-CM

## 2019-10-21 DIAGNOSIS — F419 Anxiety disorder, unspecified: Secondary | ICD-10-CM | POA: Insufficient documentation

## 2019-10-21 DIAGNOSIS — Z87891 Personal history of nicotine dependence: Secondary | ICD-10-CM | POA: Insufficient documentation

## 2019-10-21 DIAGNOSIS — C3492 Malignant neoplasm of unspecified part of left bronchus or lung: Secondary | ICD-10-CM | POA: Diagnosis present

## 2019-10-21 DIAGNOSIS — Z801 Family history of malignant neoplasm of trachea, bronchus and lung: Secondary | ICD-10-CM | POA: Diagnosis not present

## 2019-10-21 DIAGNOSIS — Z832 Family history of diseases of the blood and blood-forming organs and certain disorders involving the immune mechanism: Secondary | ICD-10-CM | POA: Diagnosis not present

## 2019-10-21 NOTE — Progress Notes (Signed)
Madeline Gates,  [id number]   CLINIC:  Medical Oncology/Hematology  PCP:  Patient, No Pcp Per No address on file None   REASON FOR VISIT:  Follow-up for right lung cancer.    INTERVAL HISTORY:  Madeline Gates 64 y.o. female seen for follow-up of right lung cancer.  Her energy levels are continuing to improve.  Appetite is 100%.  Energy levels are 75%.  No pain reported.  Denies any headaches or vision changes.  She is using a treadmill to exercise at home.     REVIEW OF SYSTEMS:  Review of Systems  All other systems reviewed and are negative.    PAST MEDICAL/SURGICAL HISTORY:  Past Medical History:  Diagnosis Date  . Anxiety   . IBS (irritable bowel syndrome)   . Multiple lung nodules    Past Surgical History:  Procedure Laterality Date  . SEGMENTECOMY Right 02/27/2019   Procedure: RIGHT UPPER WEDGE AND RIGHT LOWER SEGMENTECTOMY LYMPH NODE DISSECTION;  Surgeon: [name], MD;  Location: [place];  Service: Thoracic;  Laterality: Right;  . TONSILLECTOMY    . TUBAL LIGATION    . VIDEO ASSISTED THORACOSCOPY Right 02/27/2019   Procedure: VIDEO ASSISTED THORACOSCOPY;  Surgeon: [name], MD;  Location: [place];  Service: Thoracic;  Laterality: Right;     SOCIAL HISTORY:  Social History   Socioeconomic History  . Marital status: Married    Spouse name: Percell Miller  . Number of children: 1  . Years of education: Not on file  . Highest education level: Not on file  Occupational History  . Occupation: Forensic psychologist  Tobacco Use  . Smoking status: Former Smoker    Packs/day: 1.00    Types: Cigarettes    Quit date: 01/26/2019    Years since quitting: 0.7  . Smokeless tobacco: Never Used  Substance and Sexual Activity  . Alcohol use: Not Currently  . Drug use: Not Currently  . Sexual activity: Not on file  Other Topics Concern  . Not on file  Social History Narrative  . Not on file   Social Determinants of  Health   Financial Resource Strain: Low Risk   . Difficulty of Paying Living Expenses: Not hard at all  Food Insecurity: No Food Insecurity  . Worried About Charity fundraiser in the Last Year: Never true  . Ran Out of Food in the Last Year: Never true  Transportation Needs: Unmet Transportation Needs  . Lack of Transportation (Medical): Yes  . Lack of Transportation (Non-Medical): Yes  Physical Activity: Insufficiently Active  . Days of Exercise per Week: 2 days  . Minutes of Exercise per Session: 20 min  Stress: Stress Concern Present  . Feeling of Stress : Rather much  Social Connections: Somewhat Isolated  . Frequency of Communication with Friends and Family: More than three times a week  . Frequency of Social Gatherings with Friends and Family: Twice a week  . Attends Religious Services: Never  . Active Member of Clubs or Organizations: No  . Attends Archivist Meetings: Never  . Marital Status: Married  Human resources officer Violence: Not At Risk  . Fear of Current or Ex-Partner: No  . Emotionally Abused: No  . Physically Abused: No  . Sexually Abused: No    FAMILY HISTORY:  Family History  Problem Relation Age of Onset  . Myelodysplastic syndrome Mother   . Clotting disorder Father   . Lung cancer Paternal Aunt  CURRENT MEDICATIONS:  Outpatient Encounter Medications as of 10/21/2019  Medication Sig  . BIOTIN PO Take 1 tablet by mouth daily.  . Calcium Carb-Cholecalciferol (CALCIUM 600+D3) 600-800 MG-UNIT TABS Take 1 tablet by mouth daily.  Marland Kitchen CARBOPLATIN IV Inject into the vein every 21 ( twenty-one) days.  Marland Kitchen PEMEtrexed 500 mg/m2 in sodium chloride 0.9 % 100 mL Inject into the vein every 21 ( twenty-one) days.  Marland Kitchen sertraline (ZOLOFT) 100 MG tablet Take 100 mg by mouth daily at 3 pm.   . [DISCONTINUED] Multiple Vitamins-Minerals (MULTIVITAMIN ADULT PO) Take 1 tablet by mouth. Pt takes twice a week  . dicyclomine (BENTYL) 20 MG tablet Take 20 mg by mouth as  needed.   Marland Kitchen oxymetazoline (AFRIN) 0.05 % nasal spray Place 1 spray into both nostrils 2 (two) times daily as needed for congestion.  . pseudoephedrine-acetaminophen (TYLENOL SINUS) 30-500 MG TABS tablet Take 1 tablet by mouth every 4 (four) hours as needed.  . sodium chloride (OCEAN) 0.65 % SOLN nasal spray Place 1 spray into both nostrils as needed for congestion.  . [DISCONTINUED] loratadine (CLARITIN) 10 MG tablet Take 10 mg by mouth as needed.   . [DISCONTINUED] LORazepam (ATIVAN) 0.5 MG tablet Take 1 tablet (0.5 mg total) by mouth every 8 (eight) hours as needed for anxiety.  . [DISCONTINUED] ondansetron (ZOFRAN) 4 MG tablet TAKE ONE TABLET (4 MG DOSE) BY MOUTH DAILY AS NEEDED FOR NAUSEA.  . [DISCONTINUED] prochlorperazine (COMPAZINE) 10 MG tablet TAKE 1 TABLET (10 MG TOTAL) BY MOUTH EVERY 6 (SIX) HOURS AS NEEDED (NAUSEA OR VOMITING).   No facility-administered encounter medications on file as of 10/21/2019.    ALLERGIES:  Allergies  Allergen Reactions  . Demerol [Meperidine Hcl] Swelling    Swelling around IV site  . Ibuprofen Diarrhea and Nausea And Vomiting     PHYSICAL EXAM:  ECOG Performance status: 0  Vitals:   10/21/19 1119 10/21/19 1128  BP: 106/81 106/81  Pulse: 72 72  Resp: 18 18  Temp: (!) 97.1 F (36.2 C) (!) 97.1 F (36.2 C)  SpO2: 100% 100%   Filed Weights   10/21/19 1119 10/21/19 1128  Weight: 119 lb 6.4 oz (54.2 kg) 119 lb 6.4 oz (54.2 kg)    Physical Exam Vitals reviewed.  Constitutional:      Appearance: Normal appearance.  Cardiovascular:     Rate and Rhythm: Normal rate and regular rhythm.     Pulses: Normal pulses.     Heart sounds: Normal heart sounds.  Pulmonary:     Breath sounds: Normal breath sounds.  Abdominal:     General: There is no distension.     Palpations: Abdomen is soft. There is no mass.  Musculoskeletal:        General: No swelling.  Skin:    General: Skin is warm.  Neurological:     General: No focal deficit present.      Mental Status: She is alert and oriented to person, place, and time.  Psychiatric:        Mood and Affect: Mood normal.        Behavior: Behavior normal.      LABORATORY DATA:  I have reviewed the labs as listed.  CBC    Component Value Date/Time   WBC 9.0 10/15/2019 1132   RBC 4.05 10/15/2019 1132   HGB 13.2 10/15/2019 1132   HCT 40.2 10/15/2019 1132   PLT 362 10/15/2019 1132   MCV 99.3 10/15/2019 1132   MCH 32.6 10/15/2019  1132   MCHC 32.8 10/15/2019 1132   RDW 11.6 10/15/2019 1132   LYMPHSABS 2.9 10/15/2019 1132   MONOABS 0.5 10/15/2019 1132   EOSABS 0.1 10/15/2019 1132   BASOSABS 0.1 10/15/2019 1132   CMP Latest Ref Rng & Units 10/15/2019 07/03/2019 06/10/2019  Glucose 70 - 99 mg/dL 109(H) 99 102(H)  BUN 8 - 23 mg/dL 13 13 8   Creatinine 0.44 - 1.00 mg/dL 0.89 0.97 0.90  Sodium 135 - 145 mmol/L 140 137 139  Potassium 3.5 - 5.1 mmol/L 3.7 4.3 3.3(L)  Chloride 98 - 111 mmol/L 103 101 103  CO2 22 - 32 mmol/L 27 25 27   Calcium 8.9 - 10.3 mg/dL 9.3 9.3 9.8  Total Protein 6.5 - 8.1 g/dL 7.5 7.5 7.4  Total Bilirubin 0.3 - 1.2 mg/dL 0.3 0.2(L) 0.4  Alkaline Phos 38 - 126 U/L 60 76 79  AST 15 - 41 U/L 16 26 32  ALT 0 - 44 U/L 6 20 26        DIAGNOSTIC IMAGING:  I have independently reviewed the scans and discussed with the patient.   I have reviewed Venita Lick LPN's note and agree with the documentation.  I personally performed a face-to-face visit, made revisions and my assessment and plan is as follows.    ASSESSMENT & PLAN:   Primary lung adenocarcinoma, right ([place]) 1.  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 - Right upper lobe wedge and right lower lobe segmentectomy and lymph node dissection on 02/27/2019. -Pathology shows 3.3 cm invasive adenocarcinoma of the right lower lobe, positive visceral pleural invasion.  2.7  cm adenocarcinoma of the right upper lobe with no perineural invasion.  11 mediastinal and hilar lymph nodes are negative.  Pathological staging is PT4PN0.  Lymphovascular invasion negative.  Margins negative. - 4 cycles of adjuvant chemotherapy with carboplatin and pemetrexed from 04/04/2019 through 06/10/2019. -She reports her energy levels are back to normal although not completely 100%. -We reviewed CT chest with contrast from 10/15/2019 which did not show any evidence of local tumor recurrence.  No findings suspicious for metastatic disease.  Tiny right upper lobe 3 mm solid pulmonary nodule is stable and probably benign. -I have recommended follow-up CT scan in 4 months.  We have also reviewed her blood work which is grossly within normal limits. -If her CT scan of the chest is stable in 4 months, I will switch her visits to every 6 months.  2.  Anxiety: -She will continue Ativan 0.5 mg at bedtime as needed.      Orders placed this encounter:  Orders Placed This Encounter  Procedures  . CT Chest W Contrast  . CBC with Differential/Platelet  . Comprehensive metabolic panel  . TSH      Derek Jack, [place] [phone]

## 2019-10-21 NOTE — Assessment & Plan Note (Signed)
1.  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 - Right upper lobe wedge and right lower lobe segmentectomy and lymph node dissection on 02/27/2019. -Pathology shows 3.3 cm invasive adenocarcinoma of the right lower lobe, positive visceral pleural invasion.  2.7 cm adenocarcinoma of the right upper lobe with no perineural invasion.  11 mediastinal and hilar lymph nodes are negative.  Pathological staging is PT4PN0.  Lymphovascular invasion negative.  Margins negative. - 4 cycles of adjuvant chemotherapy with carboplatin and pemetrexed from 04/04/2019 through 06/10/2019. -She reports her energy levels are back to normal although not completely 100%. -We reviewed CT chest with contrast from 10/15/2019 which did not show any evidence of local tumor recurrence.  No findings suspicious for metastatic disease.  Tiny right upper lobe 3 mm solid pulmonary nodule is stable and probably benign. -I have recommended follow-up CT scan in 4 months.  We have also reviewed her blood work which is grossly within normal limits. -If her CT scan of the chest is stable in 4 months, I will switch her visits to every 6 months.  2.  Anxiety: -She will continue Ativan 0.5 mg at bedtime as needed.

## 2019-10-21 NOTE — Patient Instructions (Signed)
[place] at [facility] Discharge Instructions  You were seen today by Dr. Delton Coombes. He went over your recent test results. He will see you back in 4 months for labs, CT scan and follow up.   Thank you for choosing [place] at [facility] to provide your oncology and hematology care.  To afford each patient quality time with our provider, please arrive at least 15 minutes before your scheduled appointment time.   If you have a lab appointment with the [facility] please come in thru the  Main Entrance and check in at the main information desk  You need to re-schedule your appointment should you arrive 10 or more minutes late.  We strive to give you quality time with our providers, and arriving late affects you and other patients whose appointments are after yours.  Also, if you no show three or more times for appointments you may be dismissed from the clinic at the providers discretion.     Again, thank you for choosing [facility].  Our hope is that these requests will decrease the amount of time that you wait before being seen by our physicians.       _____________________________________________________________  Should you have questions after your visit to [facility], please contact our office at [phone] between the hours of 8:00 a.m. and 4:30 p.m.  Voicemails left after 4:00 p.m. will not be returned until the following business day.  For prescription refill requests, have your pharmacy contact our office and allow 72 hours.    Cancer Center Support Programs:   > Cancer Support Group  2nd Tuesday of the month 1pm-2pm, Journey Room

## 2020-02-18 ENCOUNTER — Inpatient Hospital Stay (HOSPITAL_COMMUNITY): Payer: BC Managed Care – PPO | Attending: Hematology

## 2020-02-18 ENCOUNTER — Ambulatory Visit (HOSPITAL_COMMUNITY)
Admission: RE | Admit: 2020-02-18 | Discharge: 2020-02-18 | Disposition: A | Discharge status: Home / Self Care | Payer: BC Managed Care – PPO | Source: Ambulatory Visit | Attending: Hematology | Admitting: Hematology

## 2020-02-18 ENCOUNTER — Other Ambulatory Visit: Payer: Self-pay

## 2020-02-18 DIAGNOSIS — Z85118 Personal history of other malignant neoplasm of bronchus and lung: Secondary | ICD-10-CM | POA: Insufficient documentation

## 2020-02-18 DIAGNOSIS — C3491 Malignant neoplasm of unspecified part of right bronchus or lung: Secondary | ICD-10-CM

## 2020-02-18 DIAGNOSIS — K529 Noninfective gastroenteritis and colitis, unspecified: Secondary | ICD-10-CM | POA: Diagnosis not present

## 2020-02-18 DIAGNOSIS — Z902 Acquired absence of lung [part of]: Secondary | ICD-10-CM | POA: Diagnosis not present

## 2020-02-18 DIAGNOSIS — F419 Anxiety disorder, unspecified: Secondary | ICD-10-CM | POA: Diagnosis not present

## 2020-02-18 DIAGNOSIS — D72829 Elevated white blood cell count, unspecified: Secondary | ICD-10-CM | POA: Diagnosis not present

## 2020-02-18 DIAGNOSIS — Z9221 Personal history of antineoplastic chemotherapy: Secondary | ICD-10-CM | POA: Insufficient documentation

## 2020-02-18 LAB — COMPREHENSIVE METABOLIC PANEL
ALT: 11 U/L (ref 0–44)
AST: 16 U/L (ref 15–41)
Albumin: 4.3 g/dL (ref 3.5–5.0)
Alkaline Phosphatase: 73 U/L (ref 38–126)
Anion gap: 8 (ref 5–15)
BUN: 13 mg/dL (ref 8–23)
CO2: 25 mmol/L (ref 22–32)
Calcium: 9.2 mg/dL (ref 8.9–10.3)
Chloride: 105 mmol/L (ref 98–111)
Creatinine, Ser: 0.78 mg/dL (ref 0.44–1.00)
GFR calc Af Amer: >60 mL/min (ref >60)
GFR calc non Af Amer: >60 mL/min (ref >60)
Glucose, Bld: 96 mg/dL (ref 70–99)
Potassium: 3.5 mmol/L (ref 3.5–5.1)
Sodium: 138 mmol/L (ref 135–145)
Total Bilirubin: 0.5 mg/dL (ref 0.3–1.2)
Total Protein: 7.5 g/dL (ref 6.5–8.1)

## 2020-02-18 LAB — CBC WITH DIFFERENTIAL/PLATELET
Abs Immature Granulocytes: 0.03 10*3/uL (ref 0.00–0.07)
Basophils Absolute: 0.1 10*3/uL (ref 0.0–0.1)
Basophils Relative: 1 %
Eosinophils Absolute: 0.1 10*3/uL (ref 0.0–0.5)
Eosinophils Relative: 1 %
HCT: 40.5 % (ref 36.0–46.0)
Hemoglobin: 13.2 g/dL (ref 12.0–15.0)
Immature Granulocytes: 0 %
Lymphocytes Relative: 33 %
Lymphs Abs: 3.7 10*3/uL (ref 0.7–4.0)
MCH: 31.3 pg (ref 26.0–34.0)
MCHC: 32.6 g/dL (ref 30.0–36.0)
MCV: 96 fL (ref 80.0–100.0)
Monocytes Absolute: 0.8 10*3/uL (ref 0.1–1.0)
Monocytes Relative: 7 %
Neutro Abs: 6.5 10*3/uL (ref 1.7–7.7)
Neutrophils Relative %: 58 %
Platelets: 275 10*3/uL (ref 150–400)
RBC: 4.22 MIL/uL (ref 3.87–5.11)
RDW: 12.3 % (ref 11.5–15.5)
WBC: 11.2 10*3/uL — ABNORMAL HIGH (ref 4.0–10.5)
nRBC: 0 % (ref 0.0–0.2)

## 2020-02-18 LAB — TSH: TSH: 1.275 u[IU]/mL (ref 0.350–4.500)

## 2020-02-18 MED ORDER — IOHEXOL 300 MG/ML  SOLN
75.0000 mL | Freq: Once | INTRAMUSCULAR | Status: AC | PRN
  Administered 2020-02-18: 75 mL via INTRAVENOUS

## 2020-02-25 ENCOUNTER — Other Ambulatory Visit: Payer: Self-pay

## 2020-02-25 ENCOUNTER — Inpatient Hospital Stay (HOSPITAL_BASED_OUTPATIENT_CLINIC_OR_DEPARTMENT_OTHER): Payer: BC Managed Care – PPO | Admitting: Hematology

## 2020-02-25 DIAGNOSIS — C3491 Malignant neoplasm of unspecified part of right bronchus or lung: Secondary | ICD-10-CM | POA: Diagnosis not present

## 2020-02-25 NOTE — Progress Notes (Signed)
Virtual Visit via Telephone Note  I connected with Madeline Gates on 02/25/20 at 11:30 AM EDT by telephone and verified that I am speaking with the correct person using two identifiers.   I discussed the limitations, risks, security and privacy concerns of performing an evaluation and management service by telephone and the availability of in person appointments. I also discussed with the patient that there may be a patient responsible charge related to this service. The patient expressed understanding and agreed to proceed.   History of Present Illness: She follows up in our clinic for stage III adenocarcinoma of the right lung.  She underwent right upper lobe wedge resection and right lower lobe segmentectomy on 02/27/2019.  Pathological staging is pT4 PN0.4 Cycles of adjuvant chemotherapy with carboplatin and pemetrexed from 04/04/2019 through 06/10/2019.   Observations/Objective: She reports that she was diagnosed with colitis last week and is currently on antibiotics Cipro and Flagyl.  However Cipro made her joints swell up.  She was evaluated by [facility] urgent care.  Denies any cough or hemoptysis.  No chest pains reported.  Assessment and Plan:  1.  Stage III (T4N0) adenocarcinoma right lung: -We reviewed the CT of the chest from 02/18/2020 which did not show any evidence of recurrence or metastatic disease. -We reviewed her labs which showed elevated white count of 11.2 consistent with her colitis.  LFTs are within normal limits. -We will switch her to 37-month follow-up visit at this time with repeat CT scan of the chest prior to next visit.  2.  Anxiety: -She will continue Ativan 0.5 mg at bedtime as needed.   Follow Up Instructions:  RTC 6 months with labs and scan.  I discussed the assessment and treatment plan with the patient. The patient was provided an opportunity to ask questions and all were answered. The patient agreed with the plan and demonstrated an understanding of the  instructions.   The patient was advised to call back or seek an in-person evaluation if the symptoms worsen or if the condition fails to improve as anticipated.  I provided 11 minutes of non-face-to-face time during this encounter.   Derek Jack, MD

## 2020-03-22 ENCOUNTER — Other Ambulatory Visit: Payer: Self-pay

## 2020-03-22 ENCOUNTER — Encounter (HOSPITAL_COMMUNITY): Payer: Self-pay

## 2020-03-22 ENCOUNTER — Emergency Department (HOSPITAL_COMMUNITY): Payer: BC Managed Care – PPO

## 2020-03-22 ENCOUNTER — Emergency Department (HOSPITAL_COMMUNITY)
Admission: EM | Admit: 2020-03-22 | Discharge: 2020-03-22 | Disposition: A | Discharge status: Home / Self Care | Payer: BC Managed Care – PPO | Attending: Emergency Medicine | Admitting: Emergency Medicine

## 2020-03-22 DIAGNOSIS — Z87891 Personal history of nicotine dependence: Secondary | ICD-10-CM | POA: Diagnosis not present

## 2020-03-22 DIAGNOSIS — Z886 Allergy status to analgesic agent status: Secondary | ICD-10-CM | POA: Diagnosis not present

## 2020-03-22 DIAGNOSIS — R1084 Generalized abdominal pain: Secondary | ICD-10-CM | POA: Diagnosis not present

## 2020-03-22 LAB — URINALYSIS, ROUTINE W REFLEX MICROSCOPIC
Bacteria, UA: NONE SEEN
Bilirubin Urine: NEGATIVE
Glucose, UA: NEGATIVE mg/dL
Ketones, ur: NEGATIVE mg/dL
Leukocytes,Ua: NEGATIVE
Nitrite: NEGATIVE
Protein, ur: NEGATIVE mg/dL
Specific Gravity, Urine: 1.002 — ABNORMAL LOW (ref 1.005–1.030)
pH: 6 (ref 5.0–8.0)

## 2020-03-22 LAB — COMPREHENSIVE METABOLIC PANEL
ALT: 10 U/L (ref 0–44)
AST: 23 U/L (ref 15–41)
Albumin: 4.3 g/dL (ref 3.5–5.0)
Alkaline Phosphatase: 58 U/L (ref 38–126)
Anion gap: 14 (ref 5–15)
BUN: 8 mg/dL (ref 8–23)
CO2: 23 mmol/L (ref 22–32)
Calcium: 9.3 mg/dL (ref 8.9–10.3)
Chloride: 102 mmol/L (ref 98–111)
Creatinine, Ser: 0.78 mg/dL (ref 0.44–1.00)
GFR calc Af Amer: >60 mL/min (ref >60)
GFR calc non Af Amer: >60 mL/min (ref >60)
Glucose, Bld: 88 mg/dL (ref 70–99)
Potassium: 3.9 mmol/L (ref 3.5–5.1)
Sodium: 139 mmol/L (ref 135–145)
Total Bilirubin: 0.8 mg/dL (ref 0.3–1.2)
Total Protein: 7.3 g/dL (ref 6.5–8.1)

## 2020-03-22 LAB — CBC WITH DIFFERENTIAL/PLATELET
Abs Immature Granulocytes: 0.05 10*3/uL (ref 0.00–0.07)
Basophils Absolute: 0.1 10*3/uL (ref 0.0–0.1)
Basophils Relative: 0 %
Eosinophils Absolute: 0 10*3/uL (ref 0.0–0.5)
Eosinophils Relative: 0 %
HCT: 40.5 % (ref 36.0–46.0)
Hemoglobin: 13.5 g/dL (ref 12.0–15.0)
Immature Granulocytes: 0 %
Lymphocytes Relative: 21 %
Lymphs Abs: 3 10*3/uL (ref 0.7–4.0)
MCH: 31.3 pg (ref 26.0–34.0)
MCHC: 33.3 g/dL (ref 30.0–36.0)
MCV: 93.8 fL (ref 80.0–100.0)
Monocytes Absolute: 1.2 10*3/uL — ABNORMAL HIGH (ref 0.1–1.0)
Monocytes Relative: 8 %
Neutro Abs: 10.4 10*3/uL — ABNORMAL HIGH (ref 1.7–7.7)
Neutrophils Relative %: 71 %
Platelets: 339 10*3/uL (ref 150–400)
RBC: 4.32 MIL/uL (ref 3.87–5.11)
RDW: 13.3 % (ref 11.5–15.5)
WBC: 14.7 10*3/uL — ABNORMAL HIGH (ref 4.0–10.5)
nRBC: 0 % (ref 0.0–0.2)

## 2020-03-22 LAB — LIPASE, BLOOD: Lipase: 30 U/L (ref 11–51)

## 2020-03-22 MED ORDER — AMOXICILLIN-POT CLAVULANATE 875-125 MG PO TABS: 1.0000 | ORAL_TABLET | Freq: Two times a day (BID) | ORAL | 0 refills | Status: AC

## 2020-03-22 MED ORDER — DIPHENOXYLATE-ATROPINE 2.5-0.025 MG PO TABS: 1.0000 | ORAL_TABLET | Freq: Four times a day (QID) | ORAL | 0 refills | Status: DC | PRN

## 2020-03-22 MED ORDER — IOHEXOL 9 MG/ML PO SOLN
ORAL | Status: AC
  Filled 2020-03-22: qty 1000

## 2020-03-22 MED ORDER — SODIUM CHLORIDE 0.9 % IV BOLUS
1000.0000 mL | Freq: Once | INTRAVENOUS | Status: AC
  Administered 2020-03-22: 1000 mL via INTRAVENOUS

## 2020-03-22 MED ORDER — IOHEXOL 300 MG/ML  SOLN
75.0000 mL | Freq: Once | INTRAMUSCULAR | Status: AC | PRN
  Administered 2020-03-22: 75 mL via INTRAVENOUS

## 2020-03-22 MED ORDER — DICYCLOMINE HCL 20 MG PO TABS: 20.0000 mg | ORAL_TABLET | ORAL | 0 refills | Status: DC | PRN

## 2020-03-22 NOTE — ED Triage Notes (Signed)
Pt reports that she has colitis and has had abdominal cramping and diarrhea for one month. Getting worse the past week. Pt reports last post cancer follow up she has elevated WBC. Also reports severe acid refux

## 2020-03-22 NOTE — ED Notes (Signed)
Pt completed first oral contrast.

## 2020-03-22 NOTE — ED Provider Notes (Signed)
[facility] EMERGENCY DEPARTMENT Provider Note   CSN: [id number] Arrival date & time: 03/22/20  9485     History Chief Complaint  Patient presents with  . Abdominal Pain    GRABIELA Gates is a 64 y.o. female.  The history is provided by the patient. No language interpreter was used.  Abdominal Pain Pain location:  Generalized Pain quality: aching   Pain radiates to:  Does not radiate Pain severity:  Moderate Onset quality:  Gradual Duration:  3 weeks Timing:  Constant Progression:  Worsening Chronicity:  New Context: not sick contacts   Relieved by:  Nothing Worsened by:  Nothing Ineffective treatments:  None tried Associated symptoms: nausea   Risk factors: no alcohol abuse    Pt reports she was diagnosed with colitis 3 weeks ago.  Pt has been unable to take cipro as prescribed.  Pt reports it causes her hands to swell.      Past Medical History:  Diagnosis Date  . Anxiety   . IBS (irritable bowel syndrome)   . Multiple lung nodules     Patient Active Problem List   Diagnosis Date Noted  . Primary lung adenocarcinoma, right ([place]) 03/24/2019  . S/P wedge Resection Right Upper Lobe of Lung, Segmentectomy Right Lower Lobe Lung,  02/27/2019  . Diarrhea 02/03/2019  . Right lower lobe lung mass 01/30/2019  . Mass of upper lobe of right lung 01/30/2019  . Anxiety, generalized 01/27/2019  . Nicotine dependence, cigarettes, uncomplicated 46/27/0350  . Post menopausal syndrome 07/30/2018  . Back pain 08/07/2016  . Knee effusion, left 07/12/2015  . Synovial cyst of left knee 07/12/2015  . Gynecologic exam normal 03/16/2015  . Need for Tdap vaccination 03/16/2015  . Recurrent major depressive disorder, in full remission ([place]) 03/16/2015  . Major depressive disorder, single episode, mild ([place]) 06/16/2014    Past Surgical History:  Procedure Laterality Date  . SEGMENTECOMY Right 02/27/2019   Procedure: RIGHT UPPER WEDGE AND RIGHT LOWER SEGMENTECTOMY LYMPH NODE  DISSECTION;  Surgeon: [name], MD;  Location: [place];  Service: Thoracic;  Laterality: Right;  . TONSILLECTOMY    . TUBAL LIGATION    . VIDEO ASSISTED THORACOSCOPY Right 02/27/2019   Procedure: VIDEO ASSISTED THORACOSCOPY;  Surgeon: [name], MD;  Location: [place];  Service: Thoracic;  Laterality: Right;     OB History   No obstetric history on file.     Family History  Problem Relation Age of Onset  . Myelodysplastic syndrome Mother   . Clotting disorder Father   . Lung cancer Paternal Aunt     Social History   Tobacco Use  . Smoking status: Former Smoker    Packs/day: 1.00    Types: Cigarettes    Quit date: 01/26/2019    Years since quitting: 1.1  . Smokeless tobacco: Never Used  Substance Use Topics  . Alcohol use: Not Currently  . Drug use: Not Currently    Home Medications Prior to Admission medications   Medication Sig Start Date End Date Taking? Authorizing Provider  amoxicillin-clavulanate (AUGMENTIN) 875-125 MG tablet Take 1 tablet by mouth 2 (two) times daily for 10 days. 03/22/20 04/01/20  Fransico Meadow, PA-C  Calcium Carb-Cholecalciferol (CALCIUM 600+D3) 600-800 MG-UNIT TABS Take 1 tablet by mouth daily.    [provider]  CARBOPLATIN IV Inject into the vein every 21 ( twenty-one) days. 04/04/19   [provider]  dicyclomine (BENTYL) 20 MG tablet Take 1 tablet (20 mg total) by  mouth as needed. 03/22/20   Fransico Meadow, PA-C  diphenoxylate-atropine (LOMOTIL) 2.5-0.025 MG tablet Take 1 tablet by mouth 4 (four) times daily as needed for diarrhea or loose stools. 03/22/20 03/22/21  Fransico Meadow, PA-C  oxymetazoline (AFRIN) 0.05 % nasal spray Place 1 spray into both nostrils 2 (two) times daily as needed for congestion.    [provider]  PEMEtrexed 500 mg/m2 in sodium chloride 0.9 % 100 mL Inject into the vein every 21 ( twenty-one) days. 04/04/19   [provider]  pseudoephedrine-acetaminophen (TYLENOL  SINUS) 30-500 MG TABS tablet Take 1 tablet by mouth every 4 (four) hours as needed.    [provider]  sertraline (ZOLOFT) 100 MG tablet Take 100 mg by mouth daily at 3 pm.  01/27/19   [provider]  sodium chloride (OCEAN) 0.65 % SOLN nasal spray Place 1 spray into both nostrils as needed for congestion.    [provider]    Allergies    Meperidine hcl and Ibuprofen  Review of Systems   Review of Systems  Gastrointestinal: Positive for abdominal pain and nausea.  All other systems reviewed and are negative.   Physical Exam Updated Vital Signs BP (!) 103/56   Pulse 77   Temp 98 F (36.7 C) (Oral)   Resp 14   Ht 5\' 5"  (1.651 m)   Wt 50.8 kg   SpO2 98%   BMI 18.64 kg/m   Physical Exam Vitals and nursing note reviewed.  Constitutional:      Appearance: She is well-developed.  HENT:     Head: Normocephalic.  Cardiovascular:     Rate and Rhythm: Normal rate and regular rhythm.  Pulmonary:     Effort: Pulmonary effort is normal.  Abdominal:     General: Abdomen is flat. Bowel sounds are normal. There is no distension.     Palpations: Abdomen is soft.     Tenderness: There is generalized abdominal tenderness.  Musculoskeletal:        General: Normal range of motion.     Cervical back: Normal range of motion.  Skin:    General: Skin is warm.     Capillary Refill: Capillary refill takes less than 2 seconds.  Neurological:     Mental Status: She is alert and oriented to person, place, and time.     ED Results / Procedures / Treatments   Labs (all labs ordered are listed, but only abnormal results are displayed) Labs Reviewed  CBC WITH DIFFERENTIAL/PLATELET - Abnormal; Notable for the following components:      Result Value   WBC 14.7 (*)    Neutro Abs 10.4 (*)    Monocytes Absolute 1.2 (*)    All other components within normal limits  URINALYSIS, ROUTINE W REFLEX MICROSCOPIC - Abnormal; Notable for the following components:   Color,  Urine STRAW (*)    Specific Gravity, Urine 1.002 (*)    Hgb urine dipstick SMALL (*)    All other components within normal limits  COMPREHENSIVE METABOLIC PANEL  LIPASE, BLOOD    EKG None  Radiology CT ABDOMEN PELVIS W CONTRAST  Result Date: 03/22/2020 CLINICAL DATA:  Abdominal pain, elevated white blood cell count EXAM: CT ABDOMEN AND PELVIS WITH CONTRAST TECHNIQUE: Multidetector CT imaging of the abdomen and pelvis was performed using the standard protocol following bolus administration of intravenous contrast. CONTRAST:  83mL OMNIPAQUE IOHEXOL 300 MG/ML  SOLN COMPARISON:  01/03/2019 FINDINGS: Lower chest: No acute abnormality. Hepatobiliary: No focal  liver abnormality is seen. No gallstones, gallbladder wall thickening, or biliary dilatation. Pancreas: Unremarkable. No pancreatic ductal dilatation or surrounding inflammatory changes. Spleen: Normal in size without focal abnormality. Adrenals/Urinary Tract: Adrenal glands are within normal limits. Kidneys demonstrate a normal enhancement pattern bilaterally. Normal excretion is noted on delayed images. The bladder is well distended. Stomach/Bowel: No obstructive or inflammatory changes of the colon are seen. The appendix is not well visualized although no inflammatory changes are noted. Vascular/Lymphatic: Aortic atherosclerosis. No enlarged abdominal or pelvic lymph nodes. Reproductive: Changes of prior tubal ligation are noted. Uterus is otherwise within normal limits. Other: No abdominal wall hernia or abnormality. No abdominopelvic ascites. Musculoskeletal: No acute or significant osseous findings. IMPRESSION: No acute abnormality correspond with the patient's clinical symptomatology is noted. Electronically Signed   By: Inez Catalina M.D.   On: 03/22/2020 13:21    Procedures Procedures (including critical care time)  Medications Ordered in ED Medications  iohexol (OMNIPAQUE) 9 MG/ML oral solution (has no administration in time range)    sodium chloride 0.9 % bolus 1,000 mL (0 mLs Intravenous Stopped 03/22/20 1200)  iohexol (OMNIPAQUE) 300 MG/ML solution 75 mL (75 mLs Intravenous Contrast Given 03/22/20 1231)    ED Course  I have reviewed the triage vital signs and the nursing notes.  Pertinent labs & imaging results that were available during my care of the patient were reviewed by me and considered in my medical decision making (see chart for details).    MDM Rules/Calculators/A&P                      MDM:  Pt has a history of lung cancer.  Pt resorts she is cancer free.  Pt had a resection.  Labs reviewed,  Ct scan obtained.  No acute abnormlity.  I will switch pt to Augmentin.  Pt advised to take bentyl and lomotil and augmentin   Final Clinical Impression(s) / ED Diagnoses Final diagnoses:  Generalized abdominal pain    Rx / DC Orders ED Discharge Orders         Ordered    dicyclomine (BENTYL) 20 MG tablet  As needed     03/22/20 1451    diphenoxylate-atropine (LOMOTIL) 2.5-0.025 MG tablet  4 times daily PRN     03/22/20 1504    amoxicillin-clavulanate (AUGMENTIN) 875-125 MG tablet  2 times daily     03/22/20 1504        An After Visit Summary was printed and given to the patient.    Fransico Meadow, [place] 03/22/20 1831    Fredia Sorrow, MD 04/05/20 2330

## 2020-03-22 NOTE — Discharge Instructions (Addendum)
Return if any problems.  Start taking Bentyl.  Schedule to see Gi for evaluation

## 2020-08-27 ENCOUNTER — Inpatient Hospital Stay (HOSPITAL_COMMUNITY): Payer: Medicare HMO | Attending: Hematology

## 2020-08-27 ENCOUNTER — Ambulatory Visit (HOSPITAL_COMMUNITY)
Admission: RE | Admit: 2020-08-27 | Discharge: 2020-08-27 | Disposition: A | Discharge status: Home / Self Care | Payer: Medicare HMO | Source: Ambulatory Visit | Attending: Hematology | Admitting: Hematology

## 2020-08-27 ENCOUNTER — Other Ambulatory Visit: Payer: Self-pay

## 2020-08-27 DIAGNOSIS — Z87891 Personal history of nicotine dependence: Secondary | ICD-10-CM | POA: Insufficient documentation

## 2020-08-27 DIAGNOSIS — Z79899 Other long term (current) drug therapy: Secondary | ICD-10-CM | POA: Diagnosis not present

## 2020-08-27 DIAGNOSIS — F419 Anxiety disorder, unspecified: Secondary | ICD-10-CM | POA: Diagnosis not present

## 2020-08-27 DIAGNOSIS — C3491 Malignant neoplasm of unspecified part of right bronchus or lung: Secondary | ICD-10-CM | POA: Insufficient documentation

## 2020-08-27 DIAGNOSIS — Z801 Family history of malignant neoplasm of trachea, bronchus and lung: Secondary | ICD-10-CM | POA: Insufficient documentation

## 2020-08-27 DIAGNOSIS — Z902 Acquired absence of lung [part of]: Secondary | ICD-10-CM | POA: Insufficient documentation

## 2020-08-27 DIAGNOSIS — R0981 Nasal congestion: Secondary | ICD-10-CM | POA: Diagnosis not present

## 2020-08-27 DIAGNOSIS — Z85118 Personal history of other malignant neoplasm of bronchus and lung: Secondary | ICD-10-CM | POA: Diagnosis not present

## 2020-08-27 DIAGNOSIS — Z9221 Personal history of antineoplastic chemotherapy: Secondary | ICD-10-CM | POA: Insufficient documentation

## 2020-08-27 LAB — COMPREHENSIVE METABOLIC PANEL
ALT: 12 U/L (ref 0–44)
AST: 18 U/L (ref 15–41)
Albumin: 4.6 g/dL (ref 3.5–5.0)
Alkaline Phosphatase: 72 U/L (ref 38–126)
Anion gap: 13 (ref 5–15)
BUN: 11 mg/dL (ref 8–23)
CO2: 25 mmol/L (ref 22–32)
Calcium: 10 mg/dL (ref 8.9–10.3)
Chloride: 102 mmol/L (ref 98–111)
Creatinine, Ser: 0.98 mg/dL (ref 0.44–1.00)
GFR, Estimated: >60 mL/min (ref >60)
Glucose, Bld: 90 mg/dL (ref 70–99)
Potassium: 3.6 mmol/L (ref 3.5–5.1)
Sodium: 140 mmol/L (ref 135–145)
Total Bilirubin: 0.5 mg/dL (ref 0.3–1.2)
Total Protein: 8.1 g/dL (ref 6.5–8.1)

## 2020-08-27 LAB — CBC WITH DIFFERENTIAL/PLATELET
Abs Immature Granulocytes: 0.03 10*3/uL (ref 0.00–0.07)
Basophils Absolute: 0.1 10*3/uL (ref 0.0–0.1)
Basophils Relative: 1 %
Eosinophils Absolute: 0.1 10*3/uL (ref 0.0–0.5)
Eosinophils Relative: 1 %
HCT: 42.5 % (ref 36.0–46.0)
Hemoglobin: 13.7 g/dL (ref 12.0–15.0)
Immature Granulocytes: 0 %
Lymphocytes Relative: 35 %
Lymphs Abs: 4.2 10*3/uL — ABNORMAL HIGH (ref 0.7–4.0)
MCH: 31.1 pg (ref 26.0–34.0)
MCHC: 32.2 g/dL (ref 30.0–36.0)
MCV: 96.4 fL (ref 80.0–100.0)
Monocytes Absolute: 0.7 10*3/uL (ref 0.1–1.0)
Monocytes Relative: 6 %
Neutro Abs: 7.1 10*3/uL (ref 1.7–7.7)
Neutrophils Relative %: 57 %
Platelets: 453 10*3/uL — ABNORMAL HIGH (ref 150–400)
RBC: 4.41 MIL/uL (ref 3.87–5.11)
RDW: 12.2 % (ref 11.5–15.5)
WBC: 12.2 10*3/uL — ABNORMAL HIGH (ref 4.0–10.5)
nRBC: 0 % (ref 0.0–0.2)

## 2020-08-27 MED ORDER — IOHEXOL 300 MG/ML  SOLN
75.0000 mL | Freq: Once | INTRAMUSCULAR | Status: AC | PRN
  Administered 2020-08-27: 75 mL via INTRAVENOUS

## 2020-08-31 ENCOUNTER — Other Ambulatory Visit: Payer: Self-pay

## 2020-08-31 ENCOUNTER — Inpatient Hospital Stay (HOSPITAL_COMMUNITY): Payer: Medicare HMO | Admitting: Hematology

## 2020-08-31 VITALS — BP 119/71 | HR 84 | Temp 97.1°F | Resp 17 | Wt 124.3 lb

## 2020-08-31 DIAGNOSIS — Z85118 Personal history of other malignant neoplasm of bronchus and lung: Secondary | ICD-10-CM | POA: Diagnosis not present

## 2020-08-31 DIAGNOSIS — C3491 Malignant neoplasm of unspecified part of right bronchus or lung: Secondary | ICD-10-CM | POA: Diagnosis not present

## 2020-08-31 MED ORDER — AMOXICILLIN-POT CLAVULANATE 875-125 MG PO TABS: 1.0000 | ORAL_TABLET | Freq: Two times a day (BID) | ORAL | 0 refills | Status: DC

## 2020-08-31 NOTE — Progress Notes (Signed)
[place], [place] [address]   CLINIC:  Medical Oncology/Hematology  PCP:  [place] [address] [place] [address] [phone]   REASON FOR VISIT:  Follow-up for right lung cancer  PRIOR THERAPY:  1. Right upper lobe wedge and right lower lobe segmentectomy on 02/27/2019. 2. Carboplatin and pemetrexed x 4 cycles from 04/04/2019 to 06/10/2019.  NGS Results: Not done  CURRENT THERAPY: Observation  BRIEF ONCOLOGIC HISTORY:  Oncology History  Primary lung adenocarcinoma, right ([place])  03/24/2019 Initial Diagnosis   Primary lung adenocarcinoma, right ([place])   03/24/2019 Cancer Staging   Staging form: Lung, AJCC 8th Edition - Clinical: Stage Occult (cTX, cN0, cM0) - Signed by Derek Jack, MD on 03/24/2019   03/24/2019 Cancer Staging   Staging form: Lung, AJCC 8th Edition - Pathologic: Stage IIIA (pT4, pN0, cM0) - Signed by Derek Jack, MD on 03/24/2019   04/04/2019 -  Chemotherapy   The patient had palonosetron (ALOXI) injection 0.25 mg, 0.25 mg, Intravenous,  Once, 4 of 4 cycles Administration: 0.25 mg (04/04/2019), 0.25 mg (05/20/2019), 0.25 mg (06/10/2019), 0.25 mg (04/28/2019) PEMEtrexed (ALIMTA) 700 mg in sodium chloride 0.9 % 100 mL chemo infusion, 480 mg/m2 = 725 mg, Intravenous,  Once, 4 of 4 cycles Administration: 700 mg (04/04/2019), 700 mg (05/20/2019), 700 mg (06/10/2019), 700 mg (04/28/2019) CARBOplatin (PARAPLATIN) 380 mg in sodium chloride 0.9 % 250 mL chemo infusion, 380 mg (100 % of original dose 375.5 mg), Intravenous,  Once, 4 of 4 cycles Dose modification:   (original dose 375.5 mg, Cycle 1),   (original dose 354 mg, Cycle 3),   (original dose 364.5 mg, Cycle 4),   (original dose 364.5 mg, Cycle 2) Administration: 380 mg (04/04/2019), 350 mg (05/20/2019), 360 mg (06/10/2019), 360 mg (04/28/2019) fosaprepitant (EMEND) 150 mg, dexamethasone (DECADRON) 12 mg in sodium chloride 0.9 % 145 mL IVPB, ,  Intravenous,  Once, 4 of 4 cycles Administration:  (04/04/2019),  (05/20/2019),  (06/10/2019),  (04/28/2019)  for chemotherapy treatment.      CANCER STAGING: Cancer Staging Primary lung adenocarcinoma, right ([place]) Staging form: Lung, AJCC 8th Edition - Clinical: Stage Occult (cTX, cN0, cM0) - Signed by Derek Jack, MD on 03/24/2019 - Pathologic: Stage IIIA (pT4, pN0, cM0) - Signed by Derek Jack, MD on 03/24/2019   INTERVAL HISTORY:  Madeline Gates, a 64 y.o. female, returns for routine follow-up of her right lung cancer. Madeline Gates was last contacted via telephone on 02/25/2020.   Today she reports feeling well. Her appetite is excellent. She denies having dyspnea or SOB.   REVIEW OF SYSTEMS:  Review of Systems  Constitutional: Negative for appetite change and fatigue.  HENT:   Positive for trouble swallowing (d/t teeth).   Respiratory: Positive for cough (d/t sinuses). Negative for shortness of breath.   All other systems reviewed and are negative.   PAST MEDICAL/SURGICAL HISTORY:  Past Medical History:  Diagnosis Date  . Anxiety   . IBS (irritable bowel syndrome)   . Multiple lung nodules    Past Surgical History:  Procedure Laterality Date  . SEGMENTECOMY Right 02/27/2019   Procedure: RIGHT UPPER WEDGE AND RIGHT LOWER SEGMENTECTOMY LYMPH NODE DISSECTION;  Surgeon: [name], MD;  Location: [place];  Service: Thoracic;  Laterality: Right;  . TONSILLECTOMY    . TUBAL LIGATION    . VIDEO ASSISTED THORACOSCOPY Right 02/27/2019   Procedure: VIDEO ASSISTED THORACOSCOPY;  Surgeon: [name], MD;  Location: [facility];  Service: Thoracic;  Laterality: Right;    SOCIAL HISTORY:  Social History   Socioeconomic History  . Marital status: Married    Spouse name: Percell Miller  . Number of children: 1  . Years of education: Not on file  . Highest education level: Not on file  Occupational History  . Occupation: Forensic psychologist  Tobacco Use  . Smoking  status: Former Smoker    Packs/day: 1.00    Types: Cigarettes    Quit date: 01/26/2019    Years since quitting: 1.5  . Smokeless tobacco: Never Used  Vaping Use  . Vaping Use: Never used  Substance and Sexual Activity  . Alcohol use: Not Currently  . Drug use: Not Currently  . Sexual activity: Not on file  Other Topics Concern  . Not on file  Social History Narrative  . Not on file   Social Determinants of Health   Financial Resource Strain:   . Difficulty of Paying Living Expenses: Not on file  Food Insecurity:   . Worried About Charity fundraiser in the Last Year: Not on file  . Ran Out of Food in the Last Year: Not on file  Transportation Needs:   . Lack of Transportation (Medical): Not on file  . Lack of Transportation (Non-Medical): Not on file  Physical Activity:   . Days of Exercise per Week: Not on file  . Minutes of Exercise per Session: Not on file  Stress:   . Feeling of Stress : Not on file  Social Connections:   . Frequency of Communication with Friends and Family: Not on file  . Frequency of Social Gatherings with Friends and Family: Not on file  . Attends Religious Services: Not on file  . Active Member of Clubs or Organizations: Not on file  . Attends Archivist Meetings: Not on file  . Marital Status: Not on file  Intimate Partner Violence:   . Fear of Current or Ex-Partner: Not on file  . Emotionally Abused: Not on file  . Physically Abused: Not on file  . Sexually Abused: Not on file    FAMILY HISTORY:  Family History  Problem Relation Age of Onset  . Myelodysplastic syndrome Mother   . Clotting disorder Father   . Lung cancer Paternal Aunt     CURRENT MEDICATIONS:  Current Outpatient Medications  Medication Sig Dispense Refill  . Calcium Carb-Cholecalciferol (CALCIUM 600+D3) 600-800 MG-UNIT TABS Take 1 tablet by mouth daily.    Marland Kitchen CARBOPLATIN IV Inject into the vein every 21 ( twenty-one) days.    . chlorhexidine (PERIDEX) 0.12 %  solution     . dicyclomine (BENTYL) 20 MG tablet Take 1 tablet (20 mg total) by mouth as needed. 20 tablet 0  . diphenoxylate-atropine (LOMOTIL) 2.5-0.025 MG tablet Take 1 tablet by mouth 4 (four) times daily as needed for diarrhea or loose stools. 20 tablet 0  . oxymetazoline (AFRIN) 0.05 % nasal spray Place 1 spray into both nostrils 2 (two) times daily as needed for congestion.    Marland Kitchen PEMEtrexed 500 mg/m2 in sodium chloride 0.9 % 100 mL Inject into the vein every 21 ( twenty-one) days.    . pseudoephedrine-acetaminophen (TYLENOL SINUS) 30-500 MG TABS tablet Take 1 tablet by mouth every 4 (four) hours as needed.    . sertraline (ZOLOFT) 100 MG tablet Take 100 mg by mouth daily at 3 pm.     . sodium chloride (OCEAN) 0.65 % SOLN nasal spray Place  1 spray into both nostrils as needed for congestion.    Marland Kitchen amoxicillin-clavulanate (AUGMENTIN) 875-125 MG tablet Take 1 tablet by mouth 2 (two) times daily. 14 tablet 0   No current facility-administered medications for this visit.    ALLERGIES:  Allergies  Allergen Reactions  . Meperidine Hcl Swelling    Swelling around IV site Swelling around IV site  . Aspirin Diarrhea  . Ibuprofen Diarrhea and Nausea And Vomiting    Also give her heartburn     PHYSICAL EXAM:  Performance status (ECOG): 0 - Asymptomatic  Vitals:   08/31/20 1508  BP: 119/71  Pulse: 84  Resp: 17  Temp: (!) 97.1 F (36.2 C)  SpO2: 98%   Wt Readings from Last 3 Encounters:  08/31/20 124 lb 4.8 oz (56.4 kg)  03/22/20 112 lb (50.8 kg)  10/21/19 119 lb 6.4 oz (54.2 kg)   Physical Exam   LABORATORY DATA:  I have reviewed the labs as listed.  CBC Latest Ref Rng & Units 08/27/2020 03/22/2020 02/18/2020  WBC 4.0 - 10.5 K/uL 12.2(H) 14.7(H) 11.2(H)  Hemoglobin 12.0 - 15.0 g/dL 13.7 13.5 13.2  Hematocrit 36 - 46 % 42.5 40.5 40.5  Platelets 150 - 400 K/uL 453(H) 339 275   CMP Latest Ref Rng & Units 08/27/2020 03/22/2020 02/18/2020  Glucose 70 - 99 mg/dL 90 88 96  BUN 8 - 23  mg/dL 11 8 13   Creatinine 0.44 - 1.00 mg/dL 0.98 0.78 0.78  Sodium 135 - 145 mmol/L 140 139 138  Potassium 3.5 - 5.1 mmol/L 3.6 3.9 3.5  Chloride 98 - 111 mmol/L 102 102 105  CO2 22 - 32 mmol/L 25 23 25   Calcium 8.9 - 10.3 mg/dL 10.0 9.3 9.2  Total Protein 6.5 - 8.1 g/dL 8.1 7.3 7.5  Total Bilirubin 0.3 - 1.2 mg/dL 0.5 0.8 0.5  Alkaline Phos 38 - 126 U/L 72 58 73  AST 15 - 41 U/L 18 23 16   ALT 0 - 44 U/L 12 10 11     DIAGNOSTIC IMAGING:  I have independently reviewed the scans and discussed with the patient. CT Chest W Contrast  Result Date: 08/27/2020 CLINICAL DATA:  Synchronous primary bronchogenic carcinomas of the right upper and right lower lobe status post wedge resection. EXAM: CT CHEST WITH CONTRAST TECHNIQUE: Multidetector CT imaging of the chest was performed during intravenous contrast administration. CONTRAST:  69mL OMNIPAQUE IOHEXOL 300 MG/ML  SOLN COMPARISON:  02/18/20 FINDINGS: Cardiovascular: The heart size appears normal. No pericardial effusion identified. Aortic atherosclerosis. Mediastinum/Nodes: Normal appearance of the thyroid glands. The trachea appears patent and is midline. Unremarkable appearance of the esophagus. No enlarged axillary or supraclavicular lymph nodes. No mediastinal or hilar adenopathy. Lungs/Pleura: Paraseptal and centrilobular emphysema is again noted. No pleural effusion, airspace consolidation, or pneumothorax. Post op change from right upper and right lower lobe wedge resections noted. Subpleural nodule in the posterior apical right upper lobe measures 2 mm, image 22/4. Unchanged. Stable faint sub solid nodule in the posterior aspect of the upper right lung, image 22/4. No new suspicious lung nodules identified. Upper Abdomen: No acute abnormality within the imaged portions of the upper abdomen. Musculoskeletal: No chest wall abnormality. No acute or significant osseous findings. IMPRESSION: 1. Stable CT of the chest. No specific findings identified to  suggest residual or recurrence of tumor. 2. Emphysema and aortic atherosclerosis. Aortic Atherosclerosis (ICD10-I70.0) and Emphysema (ICD10-J43.9). Electronically Signed   By: Kerby Moors M.D.   On: 08/27/2020 17:02  ASSESSMENT:  1.  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 - Right upper lobe wedge and right lower lobe segmentectomy and lymph node dissection on 02/27/2019. -Pathology shows 3.3 cm invasive adenocarcinoma of the right lower lobe, positive visceral pleural invasion.  2.7 cm adenocarcinoma of the right upper lobe with no perineural invasion.  11 mediastinal and hilar lymph nodes are negative.  Pathological staging is PT4PN0.  Lymphovascular invasion negative.  Margins negative. - 4 cycles of adjuvant chemotherapy with carboplatin and pemetrexed from 04/04/2019 through 06/10/2019. -CT chest on 08/27/2020 shows stable findings with no evidence of recurrence.   PLAN:  1.  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  Physical exam today was within normal limits. -Labs reviewed by me shows normal chemistries. -We discussed results of CT scan. -RTC 6 months with labs and CT scan. -She reports having sinus congestion with yellowish expectoration.  I have sent a prescription for Augmentin twice daily for 7 days.  2.  Anxiety: -Continue Ativan at bedtime as needed.    Orders placed this encounter:  Orders Placed This Encounter  Procedures  . CT Chest W Contrast  . CBC with Differential/Platelet  . Comprehensive metabolic panel     Derek Jack, MD [place] [phone]   I, Milinda Antis, am acting as a scribe for Dr. Sanda Linger.  I, Derek Jack MD, have reviewed the above documentation for accuracy and completeness, and I agree with  the above.

## 2020-08-31 NOTE — Patient Instructions (Signed)
Madeline Gates at [facility] Discharge Instructions  You were seen today by Dr. Delton Coombes. He went over your recent results and scans. You will be prescribed Augmentin twice daily for 7 days. You will be scheduled for a CT scan of your chest before your next visit. Dr. Delton Coombes will see you back in 6 months for labs and follow up.   Thank you for choosing [place] at [facility] to provide your oncology and hematology care.  To afford each patient quality time with our provider, please arrive at least 15 minutes before your scheduled appointment time.   If you have a lab appointment with the [facility] please come in thru the Main Entrance and check in at the main information desk  You need to re-schedule your appointment should you arrive 10 or more minutes late.  We strive to give you quality time with our providers, and arriving late affects you and other patients whose appointments are after yours.  Also, if you no show three or more times for appointments you may be dismissed from the clinic at the providers discretion.     Again, thank you for choosing [facility].  Our hope is that these requests will decrease the amount of time that you wait before being seen by our physicians.       _____________________________________________________________  Should you have questions after your visit to [facility], please contact our office at [phone] between the hours of 8:00 a.m. and 4:30 p.m.  Voicemails left after 4:00 p.m. will not be returned until the following business day.  For prescription refill requests, have your pharmacy contact our office and allow 72 hours.    Cancer Center Support Programs:   > Cancer Support Group  2nd Tuesday of the month 1pm-2pm, Journey Room

## 2021-01-07 IMAGING — DX PORTABLE CHEST - 1 VIEW
1 series · 1 of 1 positions shown · non-contrast
Comparison: February 28, 2019

CLINICAL DATA: Status post partial lobectomy of the left lung.

EXAM:
PORTABLE CHEST 1 VIEW

[chest]
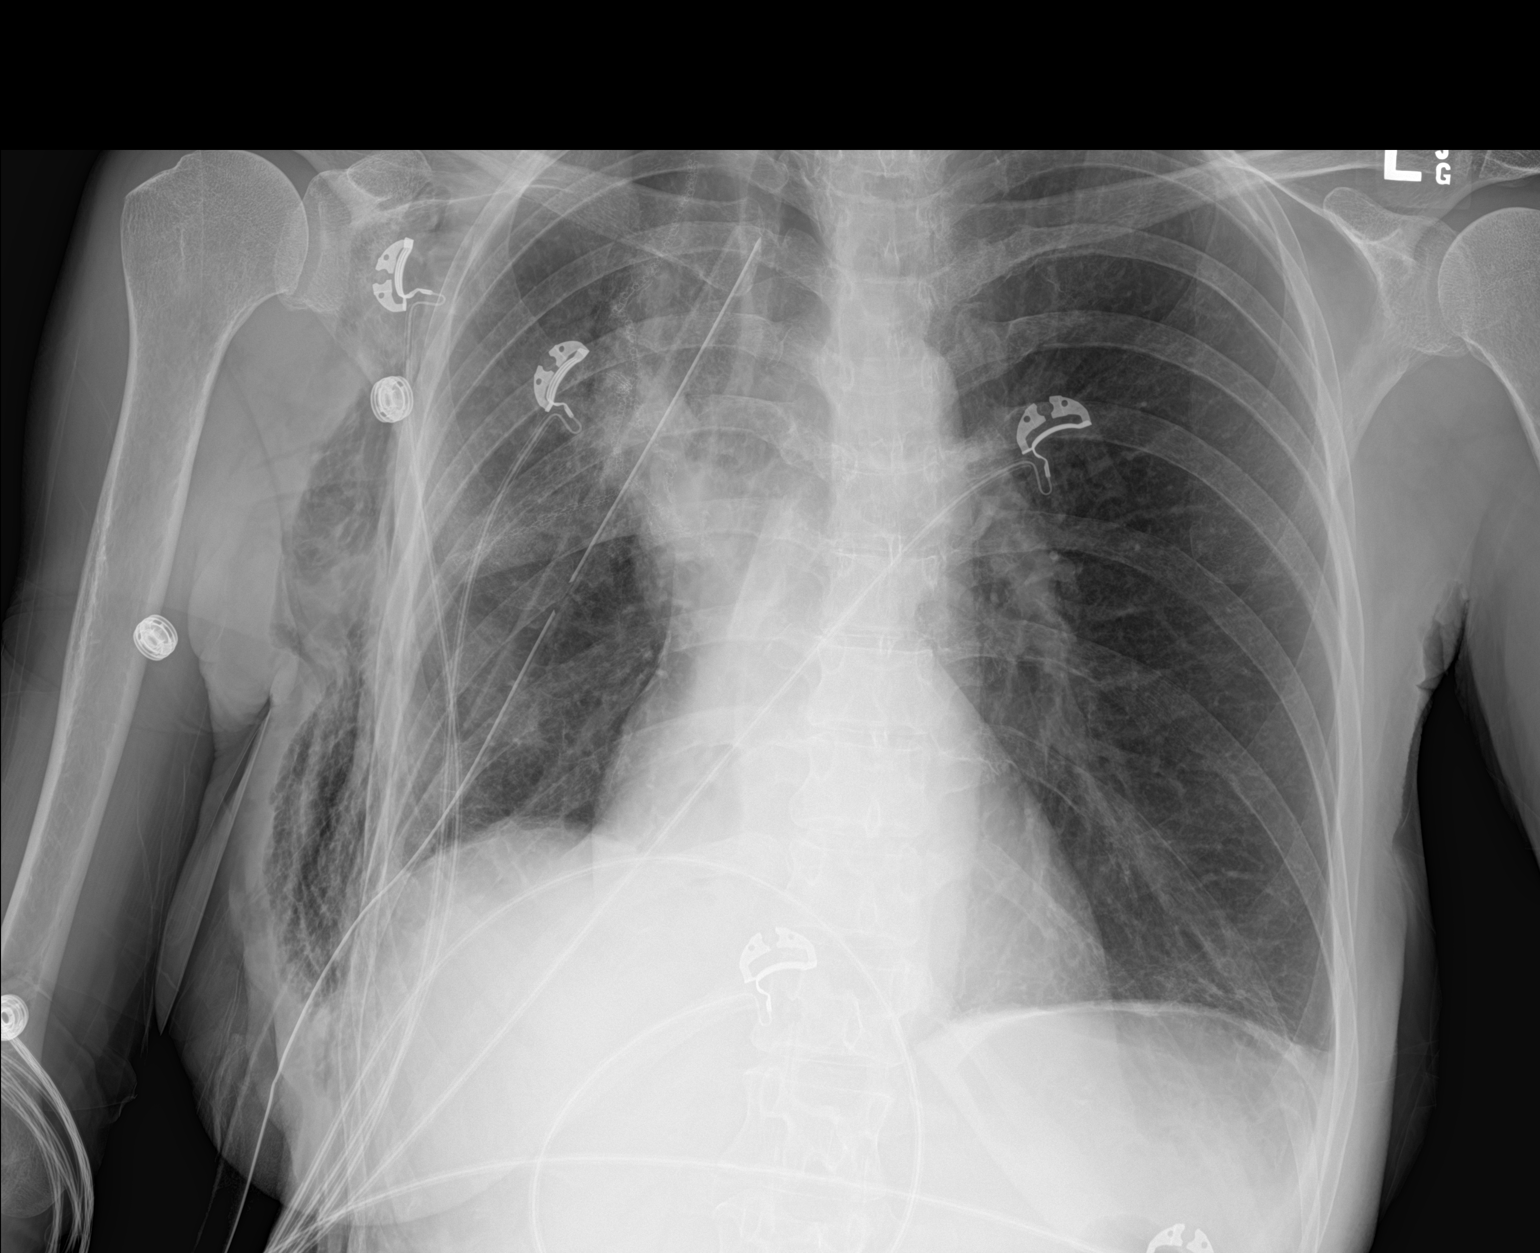

[1 of 1 positions shown; findings below may reference images not displayed]

FINDINGS: Air in the lateral right chest wall has significantly increased. The
right-sided chest tube is been repositioned. No pneumothorax.
Postoperative changes on the right. The left lung remains clear. The
cardiomediastinal silhouette is unchanged.
IMPRESSION: 1. Increasing air in the lateral right chest wall.
2. Repositioning of right chest tube, apparently remaining in good
position. No right-sided pneumothorax.

## 2021-01-08 IMAGING — DX PORTABLE CHEST - 1 VIEW
1 series · 1 of 1 positions shown · non-contrast
Comparison: March 01, 2019

CLINICAL DATA: Status post thoracotomy.

EXAM:
PORTABLE CHEST 1 VIEW

[chest ap]
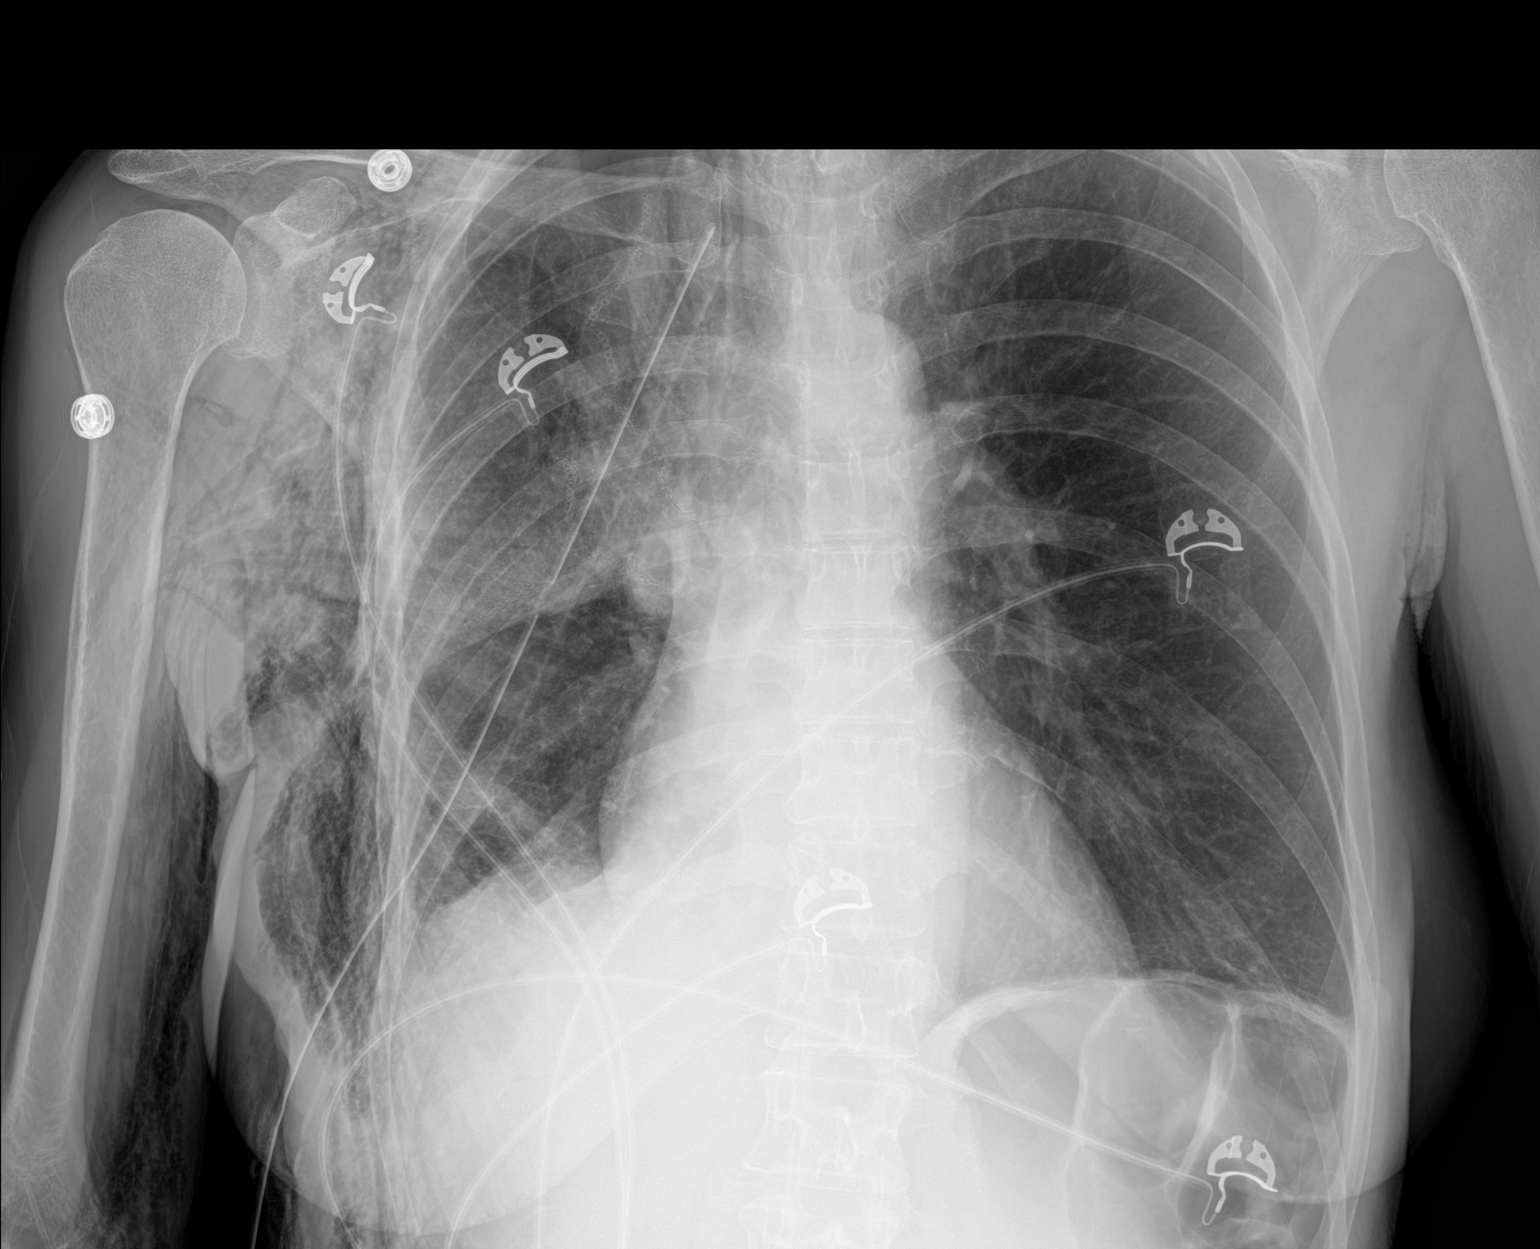

[1 of 1 positions shown; findings below may reference images not displayed]

FINDINGS: The patient is status post right thoracotomy with postsurgical
changes. A right chest tube is in similar position in the interval.
Subcutaneous gas in the right chest wall has increased, extending
into the right upper extremity. There is a pneumothorax at the right
apex measuring 2.4 cm which was not seen previously. No left-sided
pneumothorax. The left lung is clear. The cardiomediastinal
silhouette is unchanged.
IMPRESSION: 1. Increasing gas in the subcutaneous tissues at the base of the
neck on the right, in the lateral right chest wall, and extending
into the right upper extremity.
2. 2.4 cm pneumothorax at the right apex, not seen yesterday. The
right chest tube remains in similar position.
3. Postoperative changes remain on the right.

These results will be called to the ordering clinician or
representative by the Radiologist Assistant, and communication
documented in the PACS or zVision Dashboard.

## 2021-01-09 IMAGING — DX PORTABLE CHEST - 1 VIEW
1 series · 1 of 1 positions shown · non-contrast
Comparison: 03/02/2019

CLINICAL DATA: Post right lung surgery.  Evaluate for pneumothorax.

EXAM:
PORTABLE CHEST 1 VIEW

[chest ap]
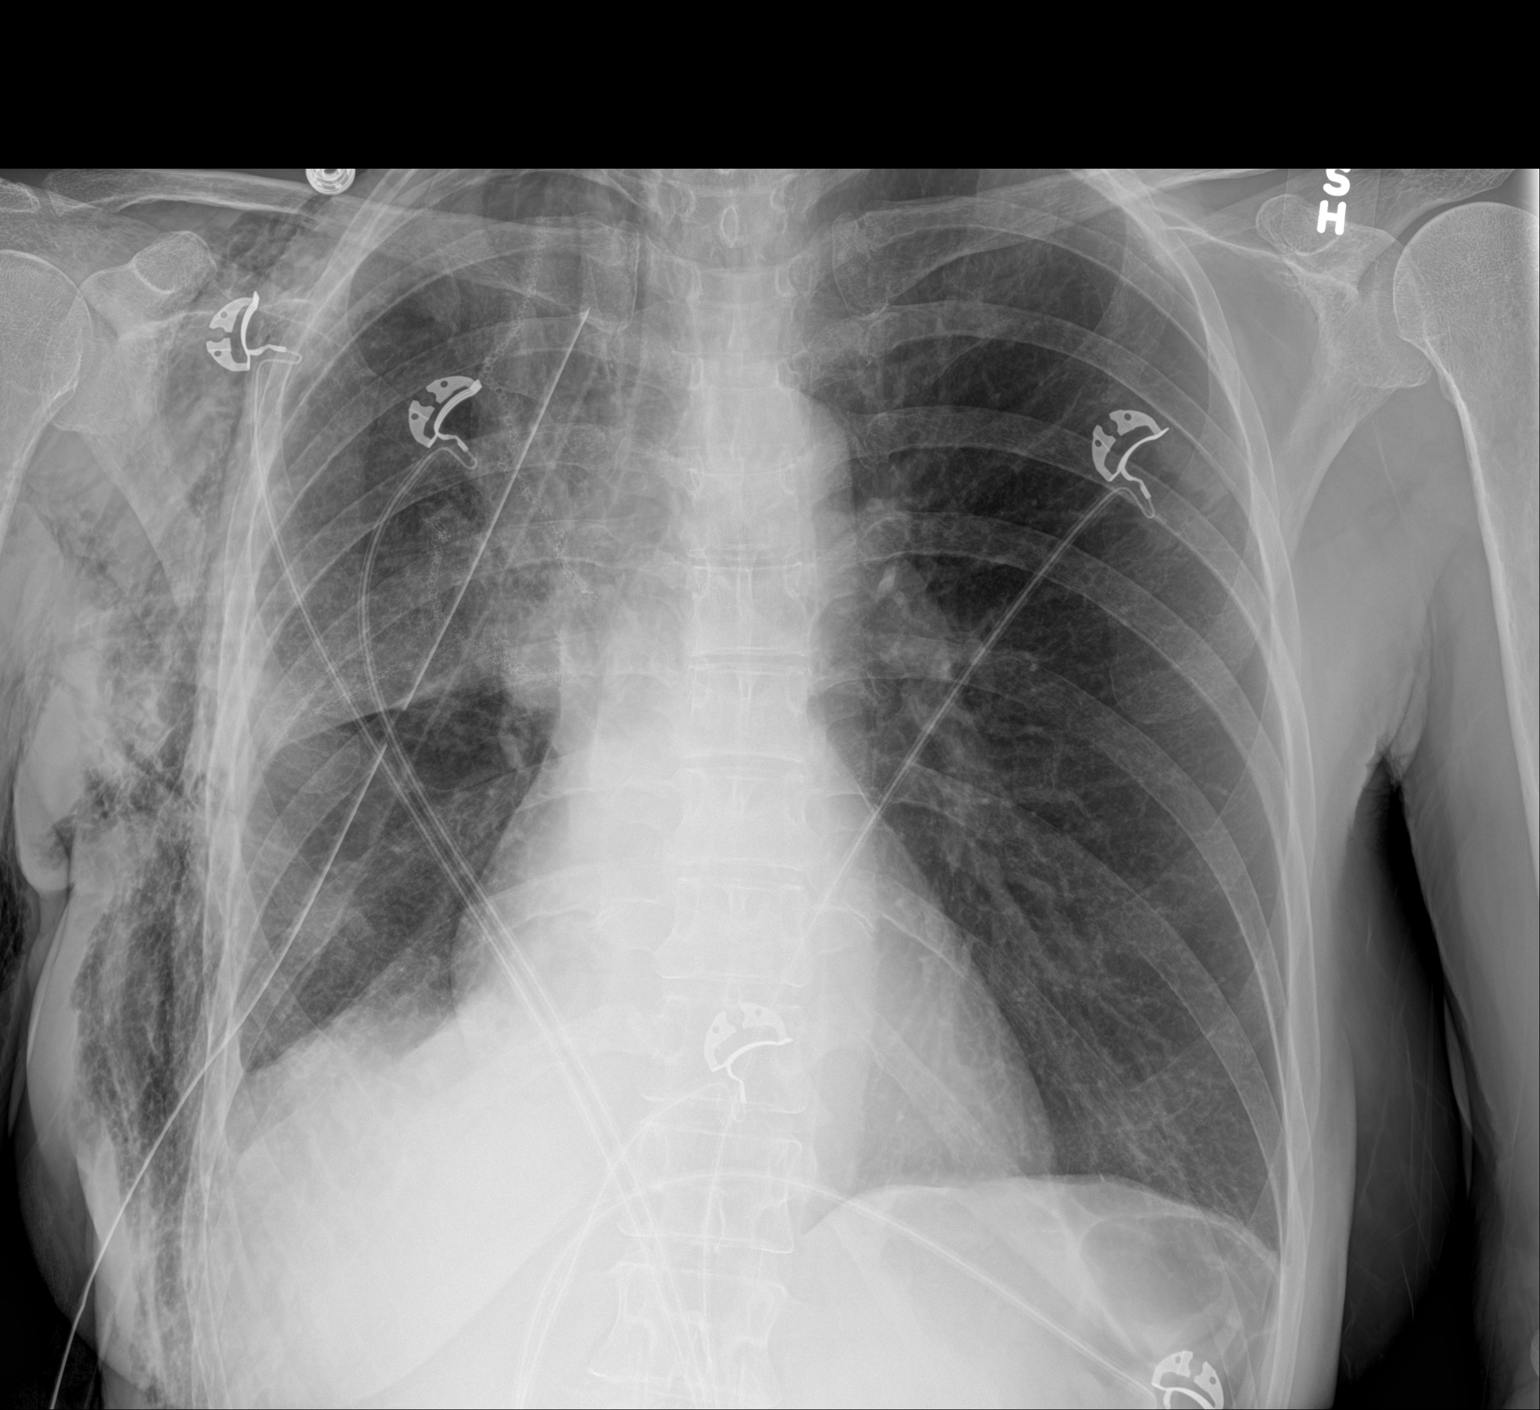

[1 of 1 positions shown; findings below may reference images not displayed]

FINDINGS: Again noted is a large amount of subcutaneous gas in the right chest
and right lower neck. Stable position of the right chest tube. A
pleural line or pneumothorax is not confidently identified on this
examination. However, visualization of the right lung apex is
limited by the subcutaneous gas. Again noted are surgical staple
lines in the right upper chest. Stable appearance of the heart and
mediastinum. Mild tracheal deviation towards the right is unchanged.
Left lung remains clear. Haziness in the mid right lung is stable.
Stable densities at the right lung base.
IMPRESSION: Stable position of the right chest tube. No definite pneumothorax on
today's examination. However, slightly limited evaluation of the
right lung apex area due to the subcutaneous gas.

## 2021-01-11 IMAGING — DX PORTABLE CHEST - 1 VIEW
1 series · 1 of 1 positions shown · non-contrast
Comparison: 03/04/2019.

CLINICAL DATA: Chest tube present status post lobectomy.

EXAM:
PORTABLE CHEST 1 VIEW

[chest]
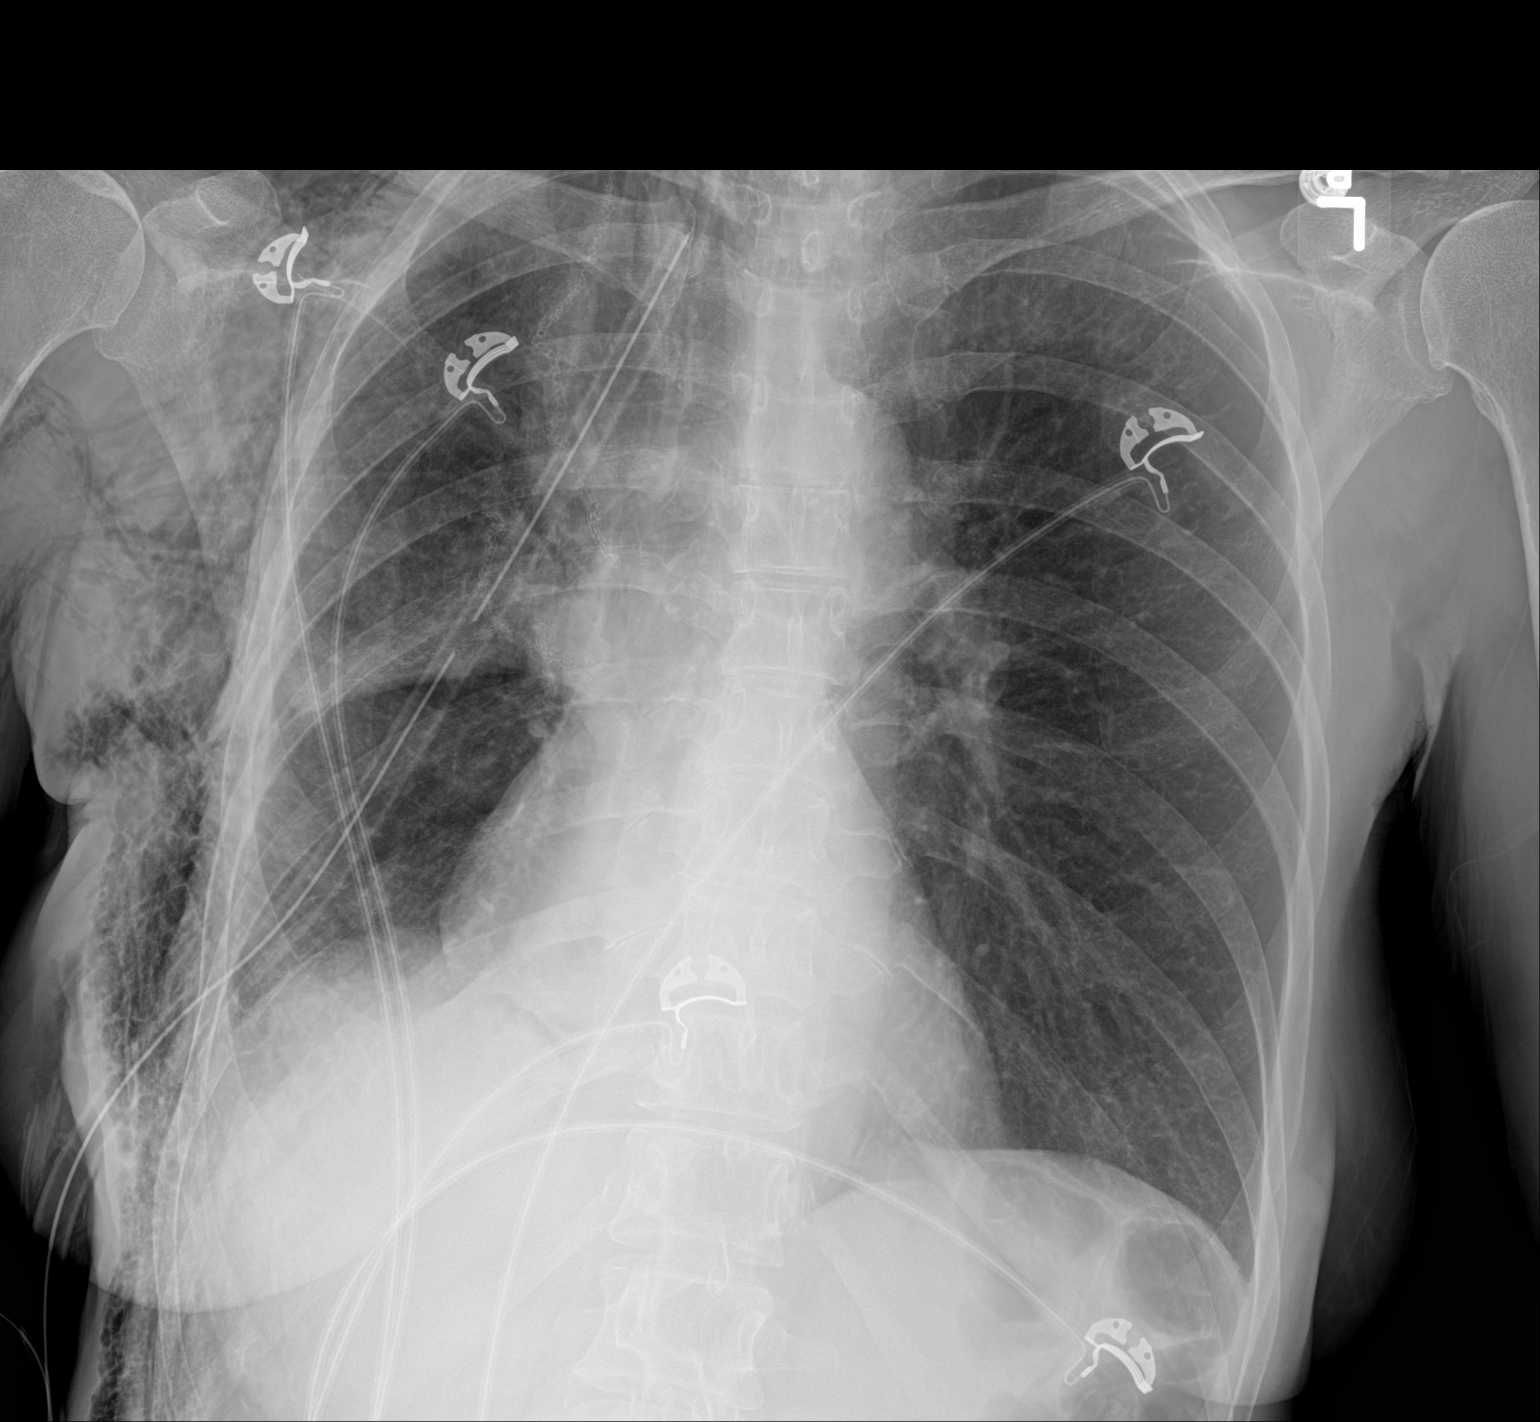

[1 of 1 positions shown; findings below may reference images not displayed]

FINDINGS: RIGHT chest tube good position. Minimal RIGHT apical pneumothorax.
Subcutaneous emphysema essentially stable. Minor RIGHT upper lobe
opacity along the horizontal fissure is improved. Clear LEFT lung.
Unchanged cardiomediastinal silhouette.
IMPRESSION: Minimal RIGHT apical  pneumothorax. Improved aeration.

## 2021-01-15 IMAGING — DX PORTABLE CHEST - 1 VIEW
1 series · 2 of 2 positions shown · non-contrast
Comparison: Chest x-ray 03/08/2019.

CLINICAL DATA: 62-year-old female with history of chest tube.

EXAM:
PORTABLE CHEST 1 VIEW

[Series 1: chest · 0.14mm/px · 2 of 2 slices shown]
[im 1/2]
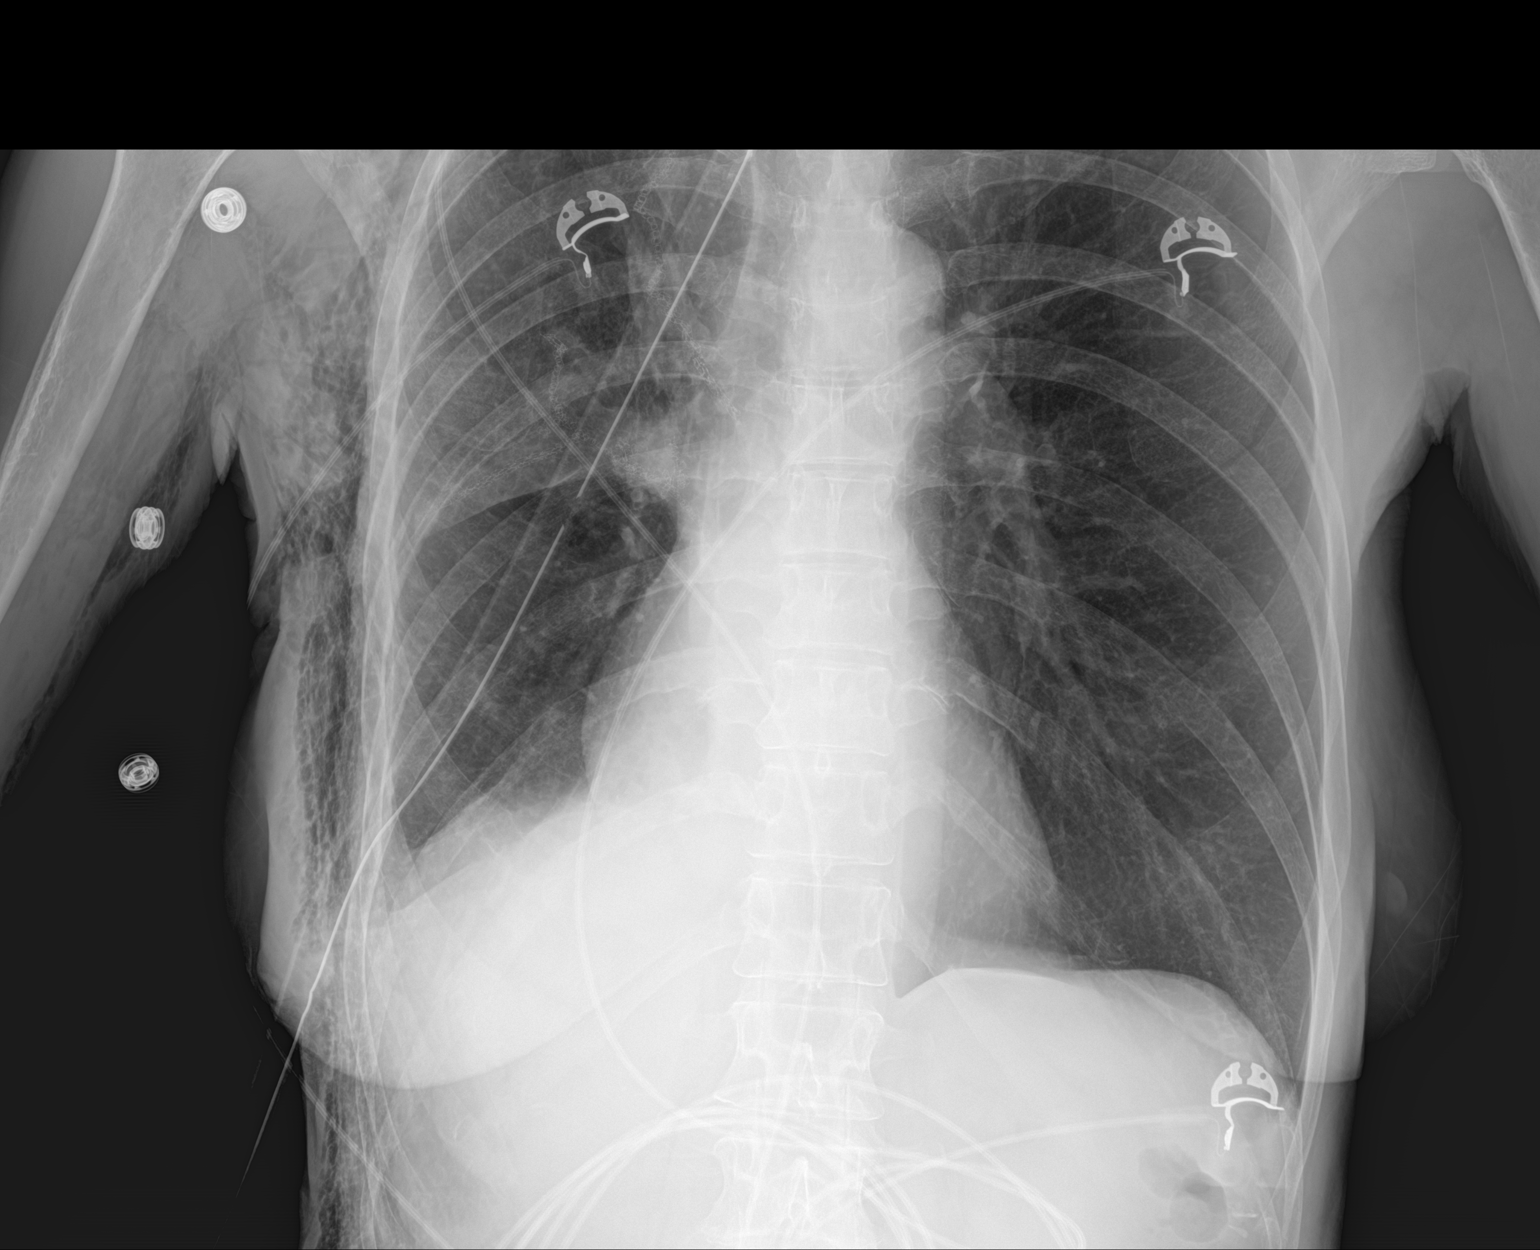
[im 2/2]
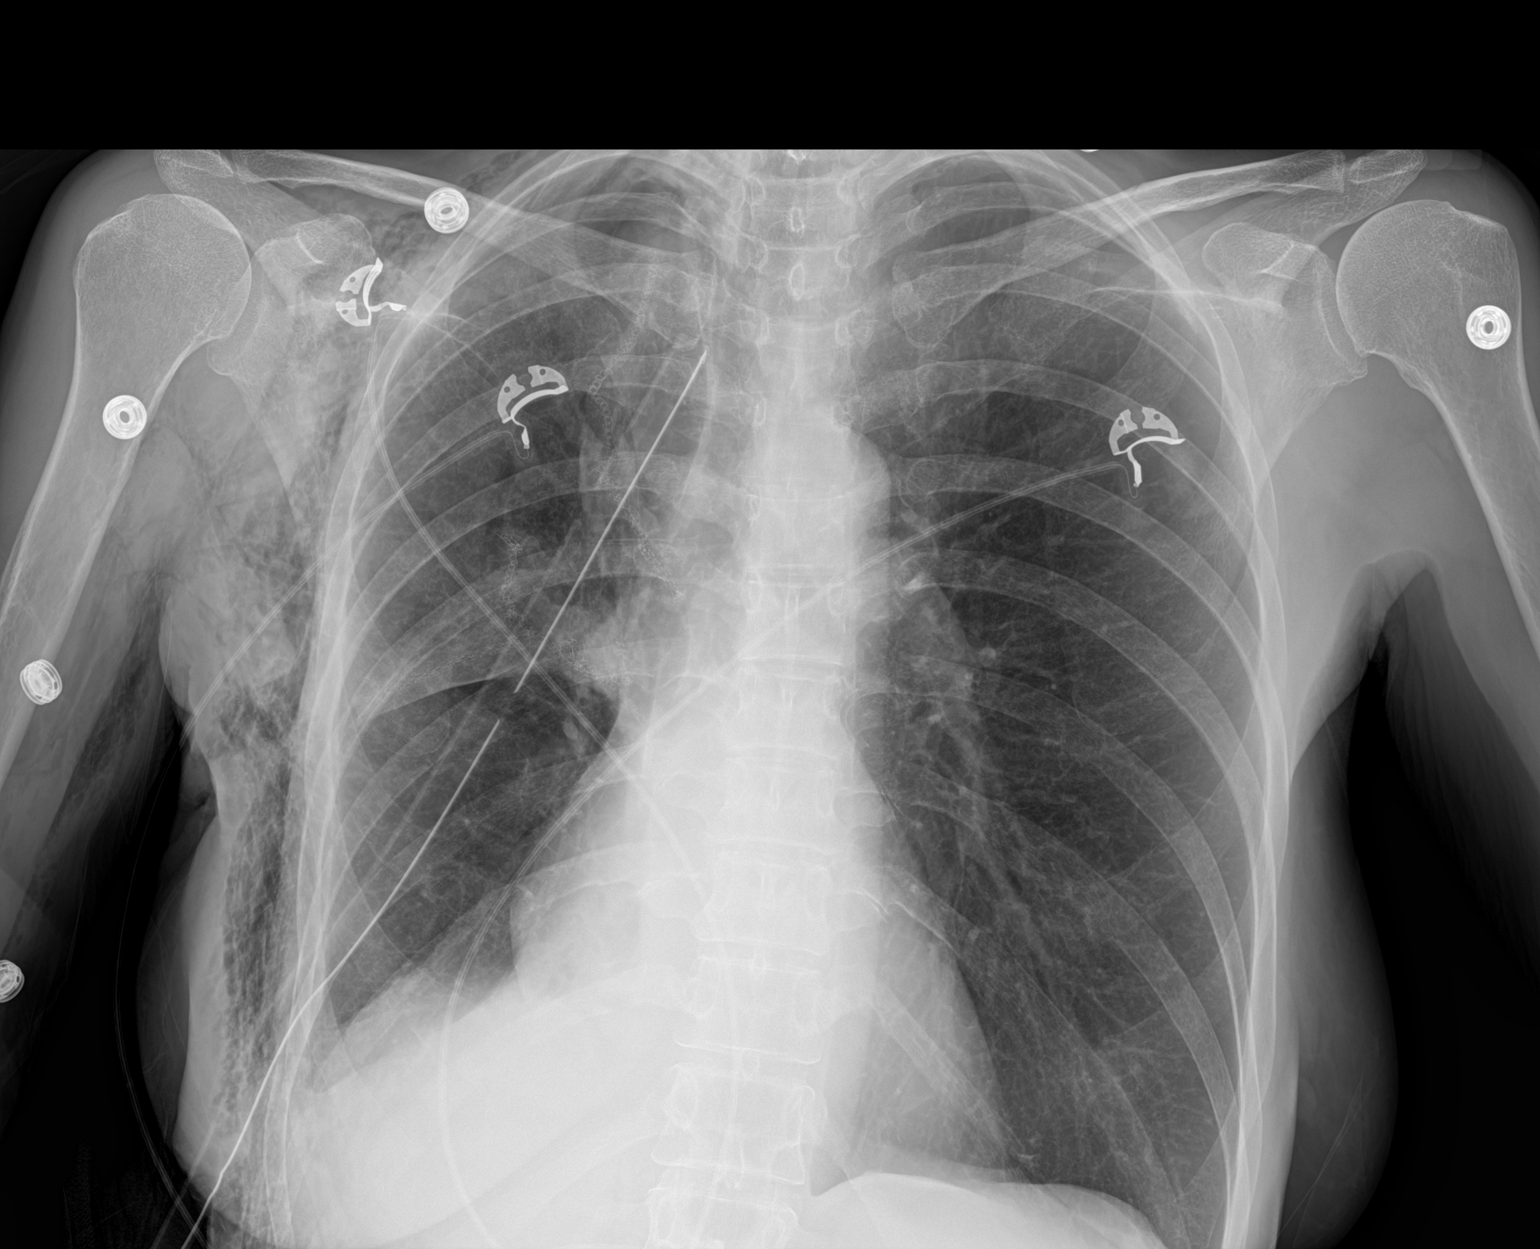

[2 of 2 positions shown; findings below may reference images not displayed]

FINDINGS: Suture lines in the right upper lobe from prior wedge resection.
Right-sided chest tube is stable in position with tip in the medial
aspect of the upper right hemithorax. Previously noted right apical
pneumothorax is no longer confidently identified. No acute
consolidative airspace disease. No pleural effusions. No evidence of
pulmonary edema. Heart size is normal. Upper mediastinal contours
are within normal limits. Extensive subcutaneous emphysema along the
right chest wall extending to the right axillary region, right upper
extremity and lower right cervical region.
IMPRESSION: 1. Stable position of right-sided chest tube with resolution of
previously noted right apical pneumothorax.

## 2021-03-01 ENCOUNTER — Ambulatory Visit (HOSPITAL_COMMUNITY)
Admission: RE | Admit: 2021-03-01 | Discharge: 2021-03-01 | Disposition: A | Discharge status: Home / Self Care | Payer: Medicare HMO | Source: Ambulatory Visit | Attending: Hematology | Admitting: Hematology

## 2021-03-01 ENCOUNTER — Other Ambulatory Visit: Payer: Self-pay

## 2021-03-01 ENCOUNTER — Inpatient Hospital Stay (HOSPITAL_COMMUNITY): Payer: Medicare HMO | Attending: Hematology

## 2021-03-01 DIAGNOSIS — C3491 Malignant neoplasm of unspecified part of right bronchus or lung: Secondary | ICD-10-CM | POA: Diagnosis present

## 2021-03-01 DIAGNOSIS — D72820 Lymphocytosis (symptomatic): Secondary | ICD-10-CM | POA: Insufficient documentation

## 2021-03-01 DIAGNOSIS — Z832 Family history of diseases of the blood and blood-forming organs and certain disorders involving the immune mechanism: Secondary | ICD-10-CM | POA: Diagnosis not present

## 2021-03-01 DIAGNOSIS — Z87891 Personal history of nicotine dependence: Secondary | ICD-10-CM | POA: Insufficient documentation

## 2021-03-01 DIAGNOSIS — Z902 Acquired absence of lung [part of]: Secondary | ICD-10-CM | POA: Diagnosis not present

## 2021-03-01 DIAGNOSIS — Z85118 Personal history of other malignant neoplasm of bronchus and lung: Secondary | ICD-10-CM | POA: Diagnosis not present

## 2021-03-01 DIAGNOSIS — Z801 Family history of malignant neoplasm of trachea, bronchus and lung: Secondary | ICD-10-CM | POA: Insufficient documentation

## 2021-03-01 LAB — COMPREHENSIVE METABOLIC PANEL
ALT: 11 U/L (ref 0–44)
AST: 19 U/L (ref 15–41)
Albumin: 4.4 g/dL (ref 3.5–5.0)
Alkaline Phosphatase: 77 U/L (ref 38–126)
Anion gap: 10 (ref 5–15)
BUN: 16 mg/dL (ref 8–23)
CO2: 26 mmol/L (ref 22–32)
Calcium: 9.5 mg/dL (ref 8.9–10.3)
Chloride: 103 mmol/L (ref 98–111)
Creatinine, Ser: 0.89 mg/dL (ref 0.44–1.00)
GFR, Estimated: >60 mL/min (ref >60)
Glucose, Bld: 97 mg/dL (ref 70–99)
Potassium: 3.8 mmol/L (ref 3.5–5.1)
Sodium: 139 mmol/L (ref 135–145)
Total Bilirubin: 0.5 mg/dL (ref 0.3–1.2)
Total Protein: 7.7 g/dL (ref 6.5–8.1)

## 2021-03-01 LAB — CBC WITH DIFFERENTIAL/PLATELET
Abs Immature Granulocytes: 0.04 10*3/uL (ref 0.00–0.07)
Basophils Absolute: 0.1 10*3/uL (ref 0.0–0.1)
Basophils Relative: 1 %
Eosinophils Absolute: 0.2 10*3/uL (ref 0.0–0.5)
Eosinophils Relative: 2 %
HCT: 41.6 % (ref 36.0–46.0)
Hemoglobin: 13.8 g/dL (ref 12.0–15.0)
Immature Granulocytes: 0 %
Lymphocytes Relative: 39 %
Lymphs Abs: 4.4 10*3/uL — ABNORMAL HIGH (ref 0.7–4.0)
MCH: 31.5 pg (ref 26.0–34.0)
MCHC: 33.2 g/dL (ref 30.0–36.0)
MCV: 95 fL (ref 80.0–100.0)
Monocytes Absolute: 0.9 10*3/uL (ref 0.1–1.0)
Monocytes Relative: 8 %
Neutro Abs: 5.8 10*3/uL (ref 1.7–7.7)
Neutrophils Relative %: 50 %
Platelets: 348 10*3/uL (ref 150–400)
RBC: 4.38 MIL/uL (ref 3.87–5.11)
RDW: 12.2 % (ref 11.5–15.5)
WBC: 11.4 10*3/uL — ABNORMAL HIGH (ref 4.0–10.5)
nRBC: 0 % (ref 0.0–0.2)

## 2021-03-01 MED ORDER — IOHEXOL 300 MG/ML  SOLN
75.0000 mL | Freq: Once | INTRAMUSCULAR | Status: AC | PRN
  Administered 2021-03-01: 75 mL via INTRAVENOUS

## 2021-03-05 NOTE — Progress Notes (Signed)
Madeline Gates,  [id number]   CLINIC:  Medical Oncology/Hematology  PCP:  [facility] [address] [place] [address] [phone]   REASON FOR VISIT:  Follow-up for right lung cancer  PRIOR THERAPY:  1. Right upper lobe wedge and right lower lobe segmentectomy on 02/27/2019. 2. Carboplatin and pemetrexed x 4 cycles from 04/04/2019 to 06/10/2019.  NGS Results: not done  CURRENT THERAPY: observation  BRIEF ONCOLOGIC HISTORY:  Oncology History  Primary lung adenocarcinoma, right ([place])  03/24/2019 Initial Diagnosis   Primary lung adenocarcinoma, right ([place])   03/24/2019 Cancer Staging   Staging form: Lung, AJCC 8th Edition - Clinical: Stage Occult (cTX, cN0, cM0) - Signed by Derek Jack, MD on 03/24/2019   03/24/2019 Cancer Staging   Staging form: Lung, AJCC 8th Edition - Pathologic: Stage IIIA (pT4, pN0, cM0) - Signed by Derek Jack, MD on 03/24/2019   04/04/2019 -  Chemotherapy   The patient had palonosetron (ALOXI) injection 0.25 mg, 0.25 mg, Intravenous,  Once, 4 of 4 cycles Administration: 0.25 mg (04/04/2019), 0.25 mg (05/20/2019), 0.25 mg (06/10/2019), 0.25 mg (04/28/2019) PEMEtrexed (ALIMTA) 700 mg in sodium chloride 0.9 % 100 mL chemo infusion, 480 mg/m2 = 725 mg, Intravenous,  Once, 4 of 4 cycles Administration: 700 mg (04/04/2019), 700 mg (05/20/2019), 700 mg (06/10/2019), 700 mg (04/28/2019) CARBOplatin (PARAPLATIN) 380 mg in sodium chloride 0.9 % 250 mL chemo infusion, 380 mg (100 % of original dose 375.5 mg), Intravenous,  Once, 4 of 4 cycles Dose modification:   (original dose 375.5 mg, Cycle 1),   (original dose 354 mg, Cycle 3),   (original dose 364.5 mg, Cycle 4),   (original dose 364.5 mg, Cycle 2) Administration: 380 mg (04/04/2019), 350 mg (05/20/2019), 360 mg (06/10/2019), 360 mg (04/28/2019) fosaprepitant (EMEND) 150 mg, dexamethasone (DECADRON) 12 mg in sodium chloride 0.9 % 145 mL IVPB, ,  Intravenous,  Once, 4 of 4 cycles Administration:  (04/04/2019),  (05/20/2019),  (06/10/2019),  (04/28/2019)  for chemotherapy treatment.      CANCER STAGING: Cancer Staging Primary lung adenocarcinoma, right ([place]) Staging form: Lung, AJCC 8th Edition - Clinical: Stage Occult (cTX, cN0, cM0) - Signed by Derek Jack, MD on 03/24/2019 - Pathologic: Stage IIIA (pT4, pN0, cM0) - Signed by Derek Jack, MD on 03/24/2019   INTERVAL HISTORY:  [name], a 65 y.o. female, returns for routine follow-up of her right lung cancer. Matasha was last seen on 08/31/2020.   Today she reports feeling good. She has not been taking any steroid medications recently, and she quit smoking. She takes Claritin OTC for allergies. She denies any chest pain or pain at her surgical site scar.    REVIEW OF SYSTEMS:  Review of Systems  Constitutional: Positive for fatigue (75%). Negative for appetite change.  Cardiovascular: Negative for chest pain.  All other systems reviewed and are negative.   PAST MEDICAL/SURGICAL HISTORY:  Past Medical History:  Diagnosis Date  . Anxiety   . IBS (irritable bowel syndrome)   . Multiple lung nodules    Past Surgical History:  Procedure Laterality Date  . SEGMENTECOMY Right 02/27/2019   Procedure: RIGHT UPPER WEDGE AND RIGHT LOWER SEGMENTECTOMY LYMPH NODE DISSECTION;  Surgeon: [name], MD;  Location: [facility];  Service: Thoracic;  Laterality: Right;  . TONSILLECTOMY    . TUBAL LIGATION    . VIDEO ASSISTED THORACOSCOPY Right 02/27/2019   Procedure: VIDEO ASSISTED THORACOSCOPY;  Surgeon:  [name], MD;  Location: [facility] OR;  Service: Thoracic;  Laterality: Right;    SOCIAL HISTORY:  Social History   Socioeconomic History  . Marital status: Married    Spouse name: Madeline Gates  . Number of children: 1  . Years of education: Not on file  . Highest education level: Not on file  Occupational History  . Occupation: Forensic psychologist   Tobacco Use  . Smoking status: Former Smoker    Packs/day: 1.00    Types: Cigarettes    Quit date: 01/26/2019    Years since quitting: 2.1  . Smokeless tobacco: Never Used  Vaping Use  . Vaping Use: Never used  Substance and Sexual Activity  . Alcohol use: Not Currently  . Drug use: Not Currently  . Sexual activity: Not on file  Other Topics Concern  . Not on file  Social History Narrative  . Not on file   Social Determinants of Health   Financial Resource Strain: Not on file  Food Insecurity: Not on file  Transportation Needs: Not on file  Physical Activity: Not on file  Stress: Not on file  Social Connections: Not on file  Intimate Partner Violence: Not on file    FAMILY HISTORY:  Family History  Problem Relation Age of Onset  . Myelodysplastic syndrome Mother   . Clotting disorder Father   . Lung cancer Paternal Aunt     CURRENT MEDICATIONS:  Current Outpatient Medications  Medication Sig Dispense Refill  . amoxicillin-clavulanate (AUGMENTIN) 875-125 MG tablet Take 1 tablet by mouth 2 (two) times daily. 14 tablet 0  . Calcium Carb-Cholecalciferol (CALCIUM 600+D3) 600-800 MG-UNIT TABS Take 1 tablet by mouth daily.    Marland Kitchen CARBOPLATIN IV Inject into the vein every 21 ( twenty-one) days.    . chlorhexidine (PERIDEX) 0.12 % solution     . dicyclomine (BENTYL) 20 MG tablet Take 1 tablet (20 mg total) by mouth as needed. 20 tablet 0  . diphenoxylate-atropine (LOMOTIL) 2.5-0.025 MG tablet Take 1 tablet by mouth 4 (four) times daily as needed for diarrhea or loose stools. 20 tablet 0  . oxymetazoline (AFRIN) 0.05 % nasal spray Place 1 spray into both nostrils 2 (two) times daily as needed for congestion.    Marland Kitchen PEMEtrexed 500 mg/m2 in sodium chloride 0.9 % 100 mL Inject into the vein every 21 ( twenty-one) days.    . pseudoephedrine-acetaminophen (TYLENOL SINUS) 30-500 MG TABS tablet Take 1 tablet by mouth every 4 (four) hours as needed.    . sertraline (ZOLOFT) 100 MG tablet  Take 100 mg by mouth daily at 3 pm.     . sodium chloride (OCEAN) 0.65 % SOLN nasal spray Place 1 spray into both nostrils as needed for congestion.     No current facility-administered medications for this visit.    ALLERGIES:  Allergies  Allergen Reactions  . Meperidine Hcl Swelling    Swelling around IV site Swelling around IV site  . Aspirin Diarrhea  . Ibuprofen Diarrhea and Nausea And Vomiting    Also give her heartburn     PHYSICAL EXAM:  Performance status (ECOG): 0 - Asymptomatic  There were no vitals filed for this visit. Wt Readings from Last 3 Encounters:  08/31/20 124 lb 4.8 oz (56.4 kg)  03/22/20 112 lb (50.8 kg)  10/21/19 119 lb 6.4 oz (54.2 kg)   Physical Exam Vitals reviewed.  Constitutional:      Appearance: Normal appearance.  Cardiovascular:     Rate and Rhythm:  Normal rate and regular rhythm.     Pulses: Normal pulses.     Heart sounds: Normal heart sounds.  Pulmonary:     Effort: Pulmonary effort is normal.     Breath sounds: Normal breath sounds.  Neurological:     General: No focal deficit present.     Mental Status: She is alert and oriented to person, place, and time.  Psychiatric:        Mood and Affect: Mood normal.        Behavior: Behavior normal.      LABORATORY DATA:  I have reviewed the labs as listed.  CBC Latest Ref Rng & Units 03/01/2021 08/27/2020 03/22/2020  WBC 4.0 - 10.5 K/uL 11.4(H) 12.2(H) 14.7(H)  Hemoglobin 12.0 - 15.0 g/dL 13.8 13.7 13.5  Hematocrit 36.0 - 46.0 % 41.6 42.5 40.5  Platelets 150 - 400 K/uL 348 453(H) 339   CMP Latest Ref Rng & Units 03/01/2021 08/27/2020 03/22/2020  Glucose 70 - 99 mg/dL 97 90 88  BUN 8 - 23 mg/dL 16 11 8   Creatinine 0.44 - 1.00 mg/dL 0.89 0.98 0.78  Sodium 135 - 145 mmol/L 139 140 139  Potassium 3.5 - 5.1 mmol/L 3.8 3.6 3.9  Chloride 98 - 111 mmol/L 103 102 102  CO2 22 - 32 mmol/L 26 25 23   Calcium 8.9 - 10.3 mg/dL 9.5 10.0 9.3  Total Protein 6.5 - 8.1 g/dL 7.7 8.1 7.3  Total  Bilirubin 0.3 - 1.2 mg/dL 0.5 0.5 0.8  Alkaline Phos 38 - 126 U/L 77 72 58  AST 15 - 41 U/L 19 18 23   ALT 0 - 44 U/L 11 12 10     DIAGNOSTIC IMAGING:  I have independently reviewed the scans and discussed with the patient. CT Chest W Contrast  Result Date: 03/02/2021 CLINICAL DATA:  Right upper and right lower lobe lung cancers, status post wedge resection/segmentectomy and chemotherapy. Asymptomatic. Diagnosed in 2020. EXAM: CT CHEST WITH CONTRAST TECHNIQUE: Multidetector CT imaging of the chest was performed during intravenous contrast administration. CONTRAST:  77mL OMNIPAQUE IOHEXOL 300 MG/ML  SOLN COMPARISON:  08/27/2020 FINDINGS: Cardiovascular: Aortic atherosclerosis. Tortuous thoracic aorta. Normal heart size, without pericardial effusion. No central pulmonary embolism, on this non-dedicated study. Mediastinum/Nodes: No supraclavicular adenopathy. No mediastinal or hilar adenopathy. Lungs/Pleura: No pleural fluid. Moderate centrilobular and paraseptal emphysema. Right upper and right lower lobe wedge resection with soft tissue thickening and architectural distortion, similar. No evidence of locally recurrent disease. Tiny right-sided pulmonary nodules are similar. Examples within the right upper lobe at 2 mm on 28/4, 21/4, and 40/4. Within the superior segment right lower lobe at 2 mm on 22/4. Clear left lung. Upper Abdomen: Normal imaged portions of the liver, spleen, stomach, pancreas, gallbladder, kidneys. Mild left adrenal thickening with maintenance of adreniform shape. Normal right adrenal gland. Abdominal aortic atherosclerosis. Musculoskeletal: No acute osseous abnormality. IMPRESSION: 1. Status post right upper and right lower lobe wedge resection, without recurrent or metastatic disease. 2. Aortic Atherosclerosis (ICD10-I70.0) and Emphysema (ICD10-J43.9). 3. Similar tiny pulmonary nodules, favored to be benign. Electronically Signed   By: Abigail Miyamoto M.D.   On: 03/02/2021 16:25      ASSESSMENT:  1. 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 -Right upper lobe wedge and right lower lobe segmentectomy and lymph node dissection on 02/27/2019. -Pathology shows 3.3 cm invasive adenocarcinoma of the right lower lobe, positive  visceral pleural invasion. 2.7 cm adenocarcinoma of the right upper lobe with no perineural invasion. 11 mediastinal and hilar lymph nodes are negative. Pathological staging is PT4PN0. Lymphovascular invasion negative. Margins negative. -4 cycles of adjuvant chemotherapy with carboplatin and pemetrexed from 04/04/2019 through 06/10/2019. -CT chest on 08/27/2020 shows stable findings with no evidence of recurrence.   PLAN:  1. 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 - Reviewed CT chest with contrast from 03/01/2021 which did not show any evidence of recurrence.  Small tiny pulmonary nodules favored to be benign. - Recommend follow-up in 6 months with repeat CT scan without contrast.  2.  Lymphocytosis: - Her white count was elevated during the most recent labs.  She had elevated absolute lymphocyte count last 2 times. - She does not have any B symptoms. - Recommend sending flow cytometry prior to next visit.    Orders placed this encounter:  No orders of the defined types were placed in this encounter.    Derek Jack, MD [place] [phone]   I, Thana Ates, am acting as a scribe for Dr. Derek Jack.  I, Derek Jack MD, have reviewed the above documentation for accuracy and completeness, and I agree with the above.

## 2021-03-07 ENCOUNTER — Other Ambulatory Visit: Payer: Self-pay

## 2021-03-07 ENCOUNTER — Inpatient Hospital Stay (HOSPITAL_COMMUNITY): Payer: Medicare HMO | Admitting: Hematology

## 2021-03-07 VITALS — BP 113/69 | HR 71 | Temp 97.0°F | Resp 16 | Wt 127.9 lb

## 2021-03-07 DIAGNOSIS — R918 Other nonspecific abnormal finding of lung field: Secondary | ICD-10-CM

## 2021-03-07 DIAGNOSIS — C3491 Malignant neoplasm of unspecified part of right bronchus or lung: Secondary | ICD-10-CM | POA: Diagnosis not present

## 2021-03-07 DIAGNOSIS — Z85118 Personal history of other malignant neoplasm of bronchus and lung: Secondary | ICD-10-CM | POA: Diagnosis not present

## 2021-03-07 NOTE — Patient Instructions (Addendum)
[facility] at [facility] Discharge Instructions  You were seen today by Dr. Delton Coombes. He went over your recent results. You will be scheduled for a CT scan of your chest prior to your next visit. Dr. Delton Coombes will see you back in 6 months for labs and follow up.   Thank you for choosing [place] at [facility] to provide your oncology and hematology care.  To afford each patient quality time with our provider, please arrive at least 15 minutes before your scheduled appointment time.   If you have a lab appointment with the [facility] please come in thru the Main Entrance and check in at the main information desk  You need to re-schedule your appointment should you arrive 10 or more minutes late.  We strive to give you quality time with our providers, and arriving late affects you and other patients whose appointments are after yours.  Also, if you no show three or more times for appointments you may be dismissed from the clinic at the providers discretion.     Again, thank you for choosing [facility].  Our hope is that these requests will decrease the amount of time that you wait before being seen by our physicians.       _____________________________________________________________  Should you have questions after your visit to [facility] Main, please contact our office at [phone] between the hours of 8:00 a.m. and 4:30 p.m.  Voicemails left after 4:00 p.m. will not be returned until the following business day.  For prescription refill requests, have your pharmacy contact our office and allow 72 hours.    Cancer Center Support Programs:   > Cancer Support Group  2nd Tuesday of the month 1pm-2pm, Journey Room

## 2021-09-12 ENCOUNTER — Ambulatory Visit (HOSPITAL_COMMUNITY)
Admission: RE | Admit: 2021-09-12 | Discharge: 2021-09-12 | Disposition: A | Discharge status: Home / Self Care | Payer: Medicare HMO | Source: Ambulatory Visit | Attending: Hematology | Admitting: Hematology

## 2021-09-12 ENCOUNTER — Inpatient Hospital Stay (HOSPITAL_COMMUNITY): Payer: Medicare HMO | Attending: Hematology

## 2021-09-12 ENCOUNTER — Other Ambulatory Visit: Payer: Self-pay

## 2021-09-12 DIAGNOSIS — C3491 Malignant neoplasm of unspecified part of right bronchus or lung: Secondary | ICD-10-CM

## 2021-09-12 DIAGNOSIS — R918 Other nonspecific abnormal finding of lung field: Secondary | ICD-10-CM

## 2021-09-12 DIAGNOSIS — D72820 Lymphocytosis (symptomatic): Secondary | ICD-10-CM | POA: Diagnosis not present

## 2021-09-12 DIAGNOSIS — Z85118 Personal history of other malignant neoplasm of bronchus and lung: Secondary | ICD-10-CM | POA: Diagnosis present

## 2021-09-12 LAB — COMPREHENSIVE METABOLIC PANEL WITH GFR
ALT: 12 U/L (ref 0–44)
AST: 19 U/L (ref 15–41)
Albumin: 4.5 g/dL (ref 3.5–5.0)
Alkaline Phosphatase: 70 U/L (ref 38–126)
Anion gap: 9 (ref 5–15)
BUN: 10 mg/dL (ref 8–23)
CO2: 27 mmol/L (ref 22–32)
Calcium: 9.9 mg/dL (ref 8.9–10.3)
Chloride: 106 mmol/L (ref 98–111)
Creatinine, Ser: 0.87 mg/dL (ref 0.44–1.00)
GFR, Estimated: >60 mL/min
Glucose, Bld: 97 mg/dL (ref 70–99)
Potassium: 3.9 mmol/L (ref 3.5–5.1)
Sodium: 142 mmol/L (ref 135–145)
Total Bilirubin: 0.4 mg/dL (ref 0.3–1.2)
Total Protein: 7.3 g/dL (ref 6.5–8.1)

## 2021-09-12 LAB — CBC WITH DIFFERENTIAL/PLATELET
Abs Immature Granulocytes: 0.04 10*3/uL (ref 0.00–0.07)
Basophils Absolute: 0.1 10*3/uL (ref 0.0–0.1)
Basophils Relative: 1 %
Eosinophils Absolute: 0.1 10*3/uL (ref 0.0–0.5)
Eosinophils Relative: 1 %
HCT: 40.5 % (ref 36.0–46.0)
Hemoglobin: 13.7 g/dL (ref 12.0–15.0)
Immature Granulocytes: 0 %
Lymphocytes Relative: 43 %
Lymphs Abs: 4.8 10*3/uL — ABNORMAL HIGH (ref 0.7–4.0)
MCH: 32.2 pg (ref 26.0–34.0)
MCHC: 33.8 g/dL (ref 30.0–36.0)
MCV: 95.1 fL (ref 80.0–100.0)
Monocytes Absolute: 0.7 10*3/uL (ref 0.1–1.0)
Monocytes Relative: 7 %
Neutro Abs: 5.4 10*3/uL (ref 1.7–7.7)
Neutrophils Relative %: 48 %
Platelets: 389 10*3/uL (ref 150–400)
RBC: 4.26 MIL/uL (ref 3.87–5.11)
RDW: 12.5 % (ref 11.5–15.5)
WBC: 11.2 10*3/uL — ABNORMAL HIGH (ref 4.0–10.5)
nRBC: 0 % (ref 0.0–0.2)

## 2021-09-14 LAB — SURGICAL PATHOLOGY

## 2021-09-16 LAB — FLOW CYTOMETRY

## 2021-09-18 NOTE — Progress Notes (Signed)
[place] [place], [place] [address]   CLINIC:  Medical Oncology/Hematology  PCP:  [facility] [address] [place] [address] [phone]   REASON FOR VISIT:  Follow-up for right lung cancer  PRIOR THERAPY:  1. Right upper lobe wedge and right lower lobe segmentectomy on 02/27/2019. 2. Carboplatin and pemetrexed x 4 cycles from 04/04/2019 to 06/10/2019.  NGS Results: not done  CURRENT THERAPY: surveillance  BRIEF ONCOLOGIC HISTORY:  Oncology History  Primary lung adenocarcinoma, right ([place])  03/24/2019 Initial Diagnosis   Primary lung adenocarcinoma, right ([place])   03/24/2019 Cancer Staging   Staging form: Lung, AJCC 8th Edition - Clinical: Stage Occult (cTX, cN0, cM0) - Signed by Derek Jack, MD on 03/24/2019    03/24/2019 Cancer Staging   Staging form: Lung, AJCC 8th Edition - Pathologic: Stage IIIA (pT4, pN0, cM0) - Signed by Derek Jack, MD on 03/24/2019    04/04/2019 -  Chemotherapy   The patient had palonosetron (ALOXI) injection 0.25 mg, 0.25 mg, Intravenous,  Once, 4 of 4 cycles Administration: 0.25 mg (04/04/2019), 0.25 mg (05/20/2019), 0.25 mg (06/10/2019), 0.25 mg (04/28/2019) PEMEtrexed (ALIMTA) 700 mg in sodium chloride 0.9 % 100 mL chemo infusion, 480 mg/m2 = 725 mg, Intravenous,  Once, 4 of 4 cycles Administration: 700 mg (04/04/2019), 700 mg (05/20/2019), 700 mg (06/10/2019), 700 mg (04/28/2019) CARBOplatin (PARAPLATIN) 380 mg in sodium chloride 0.9 % 250 mL chemo infusion, 380 mg (100 % of original dose 375.5 mg), Intravenous,  Once, 4 of 4 cycles Dose modification:   (original dose 375.5 mg, Cycle 1),   (original dose 354 mg, Cycle 3),   (original dose 364.5 mg, Cycle 4),   (original dose 364.5 mg, Cycle 2) Administration: 380 mg (04/04/2019), 350 mg (05/20/2019), 360 mg (06/10/2019), 360 mg (04/28/2019) fosaprepitant (EMEND) 150 mg, dexamethasone (DECADRON) 12 mg in sodium chloride 0.9 % 145 mL IVPB, , Intravenous,   Once, 4 of 4 cycles Administration:  (04/04/2019),  (05/20/2019),  (06/10/2019),  (04/28/2019)   for chemotherapy treatment.       CANCER STAGING:  Cancer Staging  Primary lung adenocarcinoma, right ([place]) Staging form: Lung, AJCC 8th Edition - Clinical: Stage Occult (cTX, cN0, cM0) - Signed by Derek Jack, MD on 03/24/2019 - Pathologic: Stage IIIA (pT4, pN0, cM0) - Signed by Derek Jack, MD on 03/24/2019   INTERVAL HISTORY:  Madeline Gates, a 65 y.o. female, returns for routine follow-up of her right lung cancer. Madeline Gates was last seen on 03/07/2021.   Today she reports feeling good. She reports having a lot of dental work done over the past year and a half. She is currently smoking 1-2 cigarettes daily.   REVIEW OF SYSTEMS:  Review of Systems  Constitutional:  Negative for appetite change and fatigue.  All other systems reviewed and are negative.  PAST MEDICAL/SURGICAL HISTORY:  Past Medical History:  Diagnosis Date   Anxiety    IBS (irritable bowel syndrome)    Multiple lung nodules    Past Surgical History:  Procedure Laterality Date   SEGMENTECOMY Right 02/27/2019   Procedure: RIGHT UPPER WEDGE AND RIGHT LOWER SEGMENTECTOMY LYMPH NODE DISSECTION;  Surgeon: [name], MD;  Location: [facility];  Service: Thoracic;  Laterality: Right;   TONSILLECTOMY     TUBAL LIGATION     VIDEO ASSISTED THORACOSCOPY Right 02/27/2019   Procedure: VIDEO ASSISTED THORACOSCOPY;  Surgeon: [name], MD;  Location: [facility];  Service: Thoracic;  Laterality:  Right;    SOCIAL HISTORY:  Social History   Socioeconomic History   Marital status: Married    Spouse name: Madeline Gates   Number of children: 1   Years of education: Not on file   Highest education level: Not on file  Occupational History   Occupation: Forensic psychologist  Tobacco Use   Smoking status: Former    Packs/day: 1.00    Types: Cigarettes    Quit date: 01/26/2019    Years since quitting: 2.6    Smokeless tobacco: Never  Vaping Use   Vaping Use: Never used  Substance and Sexual Activity   Alcohol use: Not Currently   Drug use: Not Currently   Sexual activity: Not on file  Other Topics Concern   Not on file  Social History Narrative   Not on file   Social Determinants of Health   Financial Resource Strain: Not on file  Food Insecurity: Not on file  Transportation Needs: Not on file  Physical Activity: Not on file  Stress: Not on file  Social Connections: Not on file  Intimate Partner Violence: Not on file    FAMILY HISTORY:  Family History  Problem Relation Age of Onset   Myelodysplastic syndrome Mother    Clotting disorder Father    Lung cancer Paternal Aunt     CURRENT MEDICATIONS:  Current Outpatient Medications  Medication Sig Dispense Refill   Calcium Carb-Cholecalciferol 600-800 MG-UNIT TABS Take 1 tablet by mouth daily.     oxymetazoline (AFRIN) 0.05 % nasal spray Place 1 spray into both nostrils 2 (two) times daily as needed for congestion.     sertraline (ZOLOFT) 100 MG tablet Take 100 mg by mouth daily at 3 pm.      sodium chloride (OCEAN) 0.65 % SOLN nasal spray Place 1 spray into both nostrils as needed for congestion.     No current facility-administered medications for this visit.    ALLERGIES:  Allergies  Allergen Reactions   Meperidine Hcl Swelling    Swelling around IV site Swelling around IV site   Aspirin Diarrhea   Ibuprofen Diarrhea and Nausea And Vomiting    Also give her heartburn     PHYSICAL EXAM:  Performance status (ECOG): 0 - Asymptomatic  Vitals:   09/19/21 1507  BP: (!) 96/58  Pulse: 75  Resp: 18  Temp: (!) 97.5 F (36.4 C)  SpO2: 100%   Wt Readings from Last 3 Encounters:  09/19/21 126 lb 5.2 oz (57.3 kg)  03/07/21 127 lb 14.4 oz (58 kg)  08/31/20 124 lb 4.8 oz (56.4 kg)   Physical Exam Vitals reviewed.  Constitutional:      Appearance: Normal appearance.  Cardiovascular:     Rate and Rhythm: Normal rate  and regular rhythm.     Pulses: Normal pulses.     Heart sounds: Normal heart sounds.  Pulmonary:     Effort: Pulmonary effort is normal.     Breath sounds: Normal breath sounds.  Musculoskeletal:     Right lower leg: No edema.     Left lower leg: No edema.  Neurological:     General: No focal deficit present.     Mental Status: She is alert and oriented to person, place, and time.  Psychiatric:        Mood and Affect: Mood normal.        Behavior: Behavior normal.     LABORATORY DATA:  I have reviewed the labs as listed.  CBC Latest Ref Rng &  Units 09/12/2021 03/01/2021 08/27/2020  WBC 4.0 - 10.5 K/uL 11.2(H) 11.4(H) 12.2(H)  Hemoglobin 12.0 - 15.0 g/dL 13.7 13.8 13.7  Hematocrit 36.0 - 46.0 % 40.5 41.6 42.5  Platelets 150 - 400 K/uL 389 348 453(H)   CMP Latest Ref Rng & Units 09/12/2021 03/01/2021 08/27/2020  Glucose 70 - 99 mg/dL 97 97 90  BUN 8 - 23 mg/dL 10 16 11   Creatinine 0.44 - 1.00 mg/dL 0.87 0.89 0.98  Sodium 135 - 145 mmol/L 142 139 140  Potassium 3.5 - 5.1 mmol/L 3.9 3.8 3.6  Chloride 98 - 111 mmol/L 106 103 102  CO2 22 - 32 mmol/L 27 26 25   Calcium 8.9 - 10.3 mg/dL 9.9 9.5 10.0  Total Protein 6.5 - 8.1 g/dL 7.3 7.7 8.1  Total Bilirubin 0.3 - 1.2 mg/dL 0.4 0.5 0.5  Alkaline Phos 38 - 126 U/L 70 77 72  AST 15 - 41 U/L 19 19 18   ALT 0 - 44 U/L 12 11 12     DIAGNOSTIC IMAGING:  I have independently reviewed the scans and discussed with the patient. CT CHEST WO CONTRAST  Result Date: 09/13/2021 CLINICAL DATA:  Lung cancer status post chemotherapy. EXAM: CT CHEST WITHOUT CONTRAST TECHNIQUE: Multidetector CT imaging of the chest was performed following the standard protocol without IV contrast. COMPARISON:  03/01/2021. FINDINGS: Cardiovascular: Atherosclerotic calcification of the aorta. Heart size normal. No pericardial effusion. Mediastinum/Nodes: No pathologically enlarged mediastinal or axillary lymph nodes. Hilar regions are difficult to definitively evaluate  without IV contrast but appear grossly unremarkable. Esophagus is grossly unremarkable. Lungs/Pleura: Centrilobular and paraseptal emphysema. Right upper and right lower lobe wedge resections. Lungs are otherwise clear. No pleural fluid. Airway is otherwise unremarkable. Upper Abdomen: There may be millimetric low-attenuation lesions in the liver, too small to characterize. Visualized portions of the liver, gallbladder and right adrenal gland are otherwise unremarkable. Slight thickening of the left adrenal gland. Visualized portion of the right kidney is unremarkable. There may be a punctate stone in the lower pole left kidney. Visualized portions of the spleen, pancreas, stomach and bowel are otherwise unremarkable. No upper abdominal adenopathy. Musculoskeletal: Degenerative changes in the spine. No worrisome lytic or sclerotic lesions. IMPRESSION: 1. No evidence of recurrent or metastatic disease. 2. Possible punctate left renal stone. 3.  Aortic atherosclerosis (ICD10-I70.0). 4.  Emphysema (ICD10-J43.9). Electronically Signed   By: Lorin Picket M.D.   On: 09/13/2021 12:25     ASSESSMENT:  1.  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 - Right upper lobe wedge and right lower lobe segmentectomy and lymph node dissection on 02/27/2019. -Pathology shows 3.3 cm invasive adenocarcinoma of the right lower lobe, positive visceral pleural invasion.  2.7 cm adenocarcinoma of the right upper lobe with no perineural invasion.  11 mediastinal and hilar lymph nodes are negative.  Pathological staging is PT4PN0.  Lymphovascular invasion negative.  Margins negative. - 4 cycles of adjuvant chemotherapy with carboplatin and pemetrexed from 04/04/2019 through 06/10/2019. -CT chest on 08/27/2020 shows stable findings with no evidence of recurrence.   PLAN:  1.  Stage III (T4N0)  adenocarcinoma the right lung: - Denies any chest pain or new onset cough. - Continues to smoke about 1 to 2 cigarettes/day. - Reviewed CT chest from 09/13/2021 which did not show any evidence of recurrence or metastatic disease.  Other findings were discussed with the patient. - Reviewed  labs from 09/04/2021 which showed normal LFTs and CBC. - RTC 6 months with repeat CT scan without contrast.  After the next normal CT scan, I will switch her to once a year follow-up with scan.   2.  Lymphocytosis: - She has a very mild leukocytosis with elevated lymphocyte count. - She does not have any B symptoms or splenomegaly or lymphadenopathy. - Reviewed flow cytometry from 09/04/2021 which did not show any monoclonal B-cell, phenotypically aberrant T-cell population.   Orders placed this encounter:  No orders of the defined types were placed in this encounter.    Derek Jack, MD [facility] [phone]   I, Thana Ates, am acting as a scribe for Dr. Derek Jack.  I, Derek Jack MD, have reviewed the above documentation for accuracy and completeness, and I agree with the above.

## 2021-09-19 ENCOUNTER — Inpatient Hospital Stay (HOSPITAL_COMMUNITY): Payer: Medicare HMO | Attending: Hematology | Admitting: Hematology

## 2021-09-19 ENCOUNTER — Other Ambulatory Visit: Payer: Self-pay

## 2021-09-19 VITALS — BP 96/58 | HR 75 | Temp 97.5°F | Resp 18 | Ht 65.0 in | Wt 126.3 lb

## 2021-09-19 DIAGNOSIS — F17210 Nicotine dependence, cigarettes, uncomplicated: Secondary | ICD-10-CM | POA: Insufficient documentation

## 2021-09-19 DIAGNOSIS — C3491 Malignant neoplasm of unspecified part of right bronchus or lung: Secondary | ICD-10-CM

## 2021-09-19 DIAGNOSIS — D72820 Lymphocytosis (symptomatic): Secondary | ICD-10-CM | POA: Insufficient documentation

## 2021-09-19 DIAGNOSIS — Z85118 Personal history of other malignant neoplasm of bronchus and lung: Secondary | ICD-10-CM | POA: Diagnosis not present

## 2021-09-19 DIAGNOSIS — Z9221 Personal history of antineoplastic chemotherapy: Secondary | ICD-10-CM | POA: Diagnosis not present

## 2021-09-19 DIAGNOSIS — R918 Other nonspecific abnormal finding of lung field: Secondary | ICD-10-CM | POA: Diagnosis not present

## 2021-09-19 NOTE — Patient Instructions (Signed)
[facility] at [facility] Discharge Instructions  You were seen and examined today by Dr. Delton Coombes. He reviewed your most recent labs and scan. Everything is looking okay. Please keep follow up appointment as scheduled.   Thank you for choosing [facility] at [facility] to provide your oncology and hematology care.  To afford each patient quality time with our provider, please arrive at least 15 minutes before your scheduled appointment time.   If you have a lab appointment with the [facility] please come in thru the Main Entrance and check in at the main information desk.  You need to re-schedule your appointment should you arrive 10 or more minutes late.  We strive to give you quality time with our providers, and arriving late affects you and other patients whose appointments are after yours.  Also, if you no show three or more times for appointments you may be dismissed from the clinic at the providers discretion.     Again, thank you for choosing [facility].  Our hope is that these requests will decrease the amount of time that you wait before being seen by our physicians.       _____________________________________________________________  Should you have questions after your visit to [facility], please contact our office at [phone] and follow the prompts.  Our office hours are 8:00 a.m. and 4:30 p.m. Monday - Friday.  Please note that voicemails left after 4:00 p.m. may not be returned until the following business day.  We are closed weekends and major holidays.  You do have access to a nurse 24-7, just call the main number to the clinic [phone] and do not press any options, hold on the line and a nurse will answer the phone.    For prescription refill requests, have your pharmacy contact our office and allow 72 hours.    Due to Covid, you will need to wear a mask upon entering the hospital. If you do  not have a mask, a mask will be given to you at the Main Entrance upon arrival. For doctor visits, patients may have 1 support person age 13 or older with them. For treatment visits, patients can not have anyone with them due to social distancing guidelines and our immunocompromised population.

## 2021-10-07 ENCOUNTER — Other Ambulatory Visit (HOSPITAL_COMMUNITY): Payer: Self-pay | Admitting: Family Medicine

## 2021-10-07 DIAGNOSIS — Z1231 Encounter for screening mammogram for malignant neoplasm of breast: Secondary | ICD-10-CM

## 2021-10-07 DIAGNOSIS — N958 Other specified menopausal and perimenopausal disorders: Secondary | ICD-10-CM

## 2021-11-10 ENCOUNTER — Ambulatory Visit (HOSPITAL_COMMUNITY): Payer: Medicare HMO

## 2021-11-18 ENCOUNTER — Ambulatory Visit (HOSPITAL_COMMUNITY)
Admission: RE | Admit: 2021-11-18 | Discharge: 2021-11-18 | Disposition: A | Discharge status: Home / Self Care | Payer: Medicare HMO | Source: Ambulatory Visit | Attending: Family Medicine | Admitting: Family Medicine

## 2021-11-18 ENCOUNTER — Other Ambulatory Visit: Payer: Self-pay

## 2021-11-18 DIAGNOSIS — Z1231 Encounter for screening mammogram for malignant neoplasm of breast: Secondary | ICD-10-CM | POA: Insufficient documentation

## 2022-03-20 ENCOUNTER — Ambulatory Visit (HOSPITAL_COMMUNITY)
Admission: RE | Admit: 2022-03-20 | Discharge: 2022-03-20 | Disposition: A | Discharge status: Home / Self Care | Payer: Medicare HMO | Source: Ambulatory Visit | Attending: Hematology | Admitting: Hematology

## 2022-03-20 ENCOUNTER — Inpatient Hospital Stay (HOSPITAL_COMMUNITY): Payer: Medicare HMO | Attending: Hematology

## 2022-03-20 DIAGNOSIS — Z85118 Personal history of other malignant neoplasm of bronchus and lung: Secondary | ICD-10-CM | POA: Insufficient documentation

## 2022-03-20 DIAGNOSIS — Z79899 Other long term (current) drug therapy: Secondary | ICD-10-CM | POA: Insufficient documentation

## 2022-03-20 DIAGNOSIS — C3491 Malignant neoplasm of unspecified part of right bronchus or lung: Secondary | ICD-10-CM | POA: Diagnosis not present

## 2022-03-20 DIAGNOSIS — R918 Other nonspecific abnormal finding of lung field: Secondary | ICD-10-CM

## 2022-03-20 DIAGNOSIS — Z7952 Long term (current) use of systemic steroids: Secondary | ICD-10-CM | POA: Insufficient documentation

## 2022-03-20 DIAGNOSIS — Z9221 Personal history of antineoplastic chemotherapy: Secondary | ICD-10-CM | POA: Insufficient documentation

## 2022-03-20 DIAGNOSIS — D72820 Lymphocytosis (symptomatic): Secondary | ICD-10-CM | POA: Insufficient documentation

## 2022-03-20 DIAGNOSIS — F17210 Nicotine dependence, cigarettes, uncomplicated: Secondary | ICD-10-CM | POA: Insufficient documentation

## 2022-03-20 LAB — CBC WITH DIFFERENTIAL/PLATELET
Abs Immature Granulocytes: 0.04 10*3/uL (ref 0.00–0.07)
Basophils Absolute: 0.1 10*3/uL (ref 0.0–0.1)
Basophils Relative: 1 %
Eosinophils Absolute: 0.2 10*3/uL (ref 0.0–0.5)
Eosinophils Relative: 1 %
HCT: 41.6 % (ref 36.0–46.0)
Hemoglobin: 13.8 g/dL (ref 12.0–15.0)
Immature Granulocytes: 0 %
Lymphocytes Relative: 35 %
Lymphs Abs: 3.8 10*3/uL (ref 0.7–4.0)
MCH: 31 pg (ref 26.0–34.0)
MCHC: 33.2 g/dL (ref 30.0–36.0)
MCV: 93.5 fL (ref 80.0–100.0)
Monocytes Absolute: 0.7 10*3/uL (ref 0.1–1.0)
Monocytes Relative: 6 %
Neutro Abs: 6 10*3/uL (ref 1.7–7.7)
Neutrophils Relative %: 57 %
Platelets: 358 10*3/uL (ref 150–400)
RBC: 4.45 MIL/uL (ref 3.87–5.11)
RDW: 12.5 % (ref 11.5–15.5)
WBC: 10.8 10*3/uL — ABNORMAL HIGH (ref 4.0–10.5)
nRBC: 0 % (ref 0.0–0.2)

## 2022-03-20 LAB — COMPREHENSIVE METABOLIC PANEL
ALT: 15 U/L (ref 0–44)
AST: 19 U/L (ref 15–41)
Albumin: 4.6 g/dL (ref 3.5–5.0)
Alkaline Phosphatase: 73 U/L (ref 38–126)
Anion gap: 6 (ref 5–15)
BUN: 14 mg/dL (ref 8–23)
CO2: 26 mmol/L (ref 22–32)
Calcium: 10 mg/dL (ref 8.9–10.3)
Chloride: 108 mmol/L (ref 98–111)
Creatinine, Ser: 0.82 mg/dL (ref 0.44–1.00)
GFR, Estimated: >60 mL/min (ref >60)
Glucose, Bld: 102 mg/dL — ABNORMAL HIGH (ref 70–99)
Potassium: 4.3 mmol/L (ref 3.5–5.1)
Sodium: 140 mmol/L (ref 135–145)
Total Bilirubin: 0.4 mg/dL (ref 0.3–1.2)
Total Protein: 7.5 g/dL (ref 6.5–8.1)

## 2022-03-25 ENCOUNTER — Other Ambulatory Visit: Payer: Self-pay | Admitting: Nurse Practitioner

## 2022-03-27 ENCOUNTER — Inpatient Hospital Stay (HOSPITAL_COMMUNITY): Payer: Medicare HMO | Admitting: Hematology

## 2022-03-27 VITALS — BP 116/65 | HR 71 | Temp 98.1°F | Resp 18 | Ht 65.0 in | Wt 124.3 lb

## 2022-03-27 DIAGNOSIS — F17210 Nicotine dependence, cigarettes, uncomplicated: Secondary | ICD-10-CM | POA: Diagnosis not present

## 2022-03-27 DIAGNOSIS — Z85118 Personal history of other malignant neoplasm of bronchus and lung: Secondary | ICD-10-CM | POA: Diagnosis present

## 2022-03-27 DIAGNOSIS — R918 Other nonspecific abnormal finding of lung field: Secondary | ICD-10-CM | POA: Diagnosis not present

## 2022-03-27 DIAGNOSIS — D72820 Lymphocytosis (symptomatic): Secondary | ICD-10-CM | POA: Diagnosis not present

## 2022-03-27 DIAGNOSIS — Z9221 Personal history of antineoplastic chemotherapy: Secondary | ICD-10-CM | POA: Diagnosis not present

## 2022-03-27 DIAGNOSIS — Z7952 Long term (current) use of systemic steroids: Secondary | ICD-10-CM | POA: Diagnosis not present

## 2022-03-27 DIAGNOSIS — Z79899 Other long term (current) drug therapy: Secondary | ICD-10-CM | POA: Diagnosis not present

## 2022-03-27 NOTE — Progress Notes (Signed)
[place], Madeline Gates [id number]   CLINIC:  Medical Oncology/Hematology  PCP:  [facility] [address] [place] [address] [phone]   REASON FOR VISIT:  Follow-up for right lung cancer  PRIOR THERAPY:  1. Right upper lobe wedge and right lower lobe segmentectomy on 02/27/2019. 2. Carboplatin and pemetrexed x 4 cycles from 04/04/2019 to 06/10/2019.  NGS Results: not done  CURRENT THERAPY: surveillance  BRIEF ONCOLOGIC HISTORY:  Oncology History  Primary lung adenocarcinoma, right ([place])  03/24/2019 Initial Diagnosis   Primary lung adenocarcinoma, right ([place])   03/24/2019 Cancer Staging   Staging form: Lung, AJCC 8th Edition - Clinical: Stage Occult (cTX, cN0, cM0) - Signed by Derek Jack, MD on 03/24/2019   03/24/2019 Cancer Staging   Staging form: Lung, AJCC 8th Edition - Pathologic: Stage IIIA (pT4, pN0, cM0) - Signed by Derek Jack, MD on 03/24/2019   04/04/2019 - 06/10/2019 Chemotherapy   Patient is on Treatment Plan : LUNG NSCLC Pemetrexed ([place]) / Carboplatin q21d x 4 cycles       CANCER STAGING:  Cancer Staging  Primary lung adenocarcinoma, right ([place]) Staging form: Lung, AJCC 8th Edition - Clinical: Stage Occult (cTX, cN0, cM0) - Signed by Derek Jack, MD on 03/24/2019 - Pathologic: Stage IIIA (pT4, pN0, cM0) - Signed by Derek Jack, MD on 03/24/2019   INTERVAL HISTORY:  Madeline Gates, a 66 y.o. female, returns for routine follow-up of her right lung cancer. [place]onia was last seen on 09/19/2021.   Today she reports feeling good. She reports she currently has a sinus infection; she otherwise denies infections within the last 6 months. She denies cough and CP.   REVIEW OF SYSTEMS:  Review of Systems  Constitutional:  Negative for appetite change and fatigue.  Respiratory:  Negative for cough.   Cardiovascular:  Negative for chest pain.  All other systems reviewed and are  negative.   PAST MEDICAL/SURGICAL HISTORY:  Past Medical History:  Diagnosis Date   Anxiety    IBS (irritable bowel syndrome)    Multiple lung nodules    Past Surgical History:  Procedure Laterality Date   SEGMENTECOMY Right 02/27/2019   Procedure: RIGHT UPPER WEDGE AND RIGHT LOWER SEGMENTECTOMY LYMPH NODE DISSECTION;  Surgeon: [name], MD;  Location: [facility];  Service: Thoracic;  Laterality: Right;   TONSILLECTOMY     TUBAL LIGATION     VIDEO ASSISTED THORACOSCOPY Right 02/27/2019   Procedure: VIDEO ASSISTED THORACOSCOPY;  Surgeon: [name], MD;  Location: [facility];  Service: Thoracic;  Laterality: Right;    SOCIAL HISTORY:  Social History   Socioeconomic History   Marital status: Married    Spouse name: Percell Miller   Number of children: 1   Years of education: Not on file   Highest education level: Not on file  Occupational History   Occupation: Forensic psychologist  Tobacco Use   Smoking status: Former    Packs/day: 1.00    Types: Cigarettes    Quit date: 01/26/2019    Years since quitting: 3.1   Smokeless tobacco: Never  Vaping Use   Vaping Use: Never used  Substance and Sexual Activity   Alcohol use: Not Currently   Drug use: Not Currently   Sexual activity: Not on file  Other Topics Concern   Not on file  Social History Narrative   Not on file   Social Determinants of Health   Financial Resource Strain: Low Risk  (  01/30/2019)   Overall Financial Resource Strain (CARDIA)    Difficulty of Paying Living Expenses: Not hard at all  Food Insecurity: No Food Insecurity (01/30/2019)   Hunger Vital Sign    Worried About Running Out of Food in the Last Year: Never true    Ran Out of Food in the Last Year: Never true  Transportation Needs: Unmet Transportation Needs (01/30/2019)   PRAPARE - Transportation    Lack of Transportation (Medical): Yes    Lack of Transportation (Non-Medical): Yes  Physical Activity: Insufficiently Active (01/30/2019)   Exercise  Vital Sign    Days of Exercise per Week: 2 days    Minutes of Exercise per Session: 20 min  Stress: Stress Concern Present (01/30/2019)   [place]    Feeling of Stress : Rather much  Social Connections: Somewhat Isolated (01/30/2019)   Social Connection and Isolation Panel [NHANES]    Frequency of Communication with Friends and Family: More than three times a week    Frequency of Social Gatherings with Friends and Family: Twice a week    Attends Religious Services: Never    Marine scientist or Organizations: No    Attends Archivist Meetings: Never    Marital Status: Married  Human resources officer Violence: Not At Risk (01/30/2019)   Humiliation, Afraid, Rape, and Kick questionnaire    Fear of Current or Ex-Partner: No    Emotionally Abused: No    Physically Abused: No    Sexually Abused: No    FAMILY HISTORY:  Family History  Problem Relation Age of Onset   Myelodysplastic syndrome Mother    Clotting disorder Father    Lung cancer Paternal Aunt     CURRENT MEDICATIONS:  Current Outpatient Medications  Medication Sig Dispense Refill   atorvastatin (LIPITOR) 20 MG tablet Take 20 mg by mouth daily.     Calcium Carb-Cholecalciferol 600-800 MG-UNIT TABS Take 1 tablet by mouth daily.     methylPREDNISolone (MEDROL DOSEPAK) 4 MG TBPK tablet Take by mouth.     oxymetazoline (AFRIN) 0.05 % nasal spray Place 1 spray into both nostrils 2 (two) times daily as needed for congestion.     sertraline (ZOLOFT) 100 MG tablet Take 1 tablet by mouth daily.     sodium chloride (OCEAN) 0.65 % SOLN nasal spray Place 1 spray into both nostrils as needed for congestion.     No current facility-administered medications for this visit.    ALLERGIES:  Allergies  Allergen Reactions   Meperidine Hcl Swelling    Swelling around IV site Swelling around IV site   Aspirin Diarrhea   Ibuprofen Diarrhea and Nausea And Vomiting     Also give her heartburn     PHYSICAL EXAM:  Performance status (ECOG): 0 - Asymptomatic  Vitals:   03/27/22 1449  BP: 116/65  Pulse: 71  Resp: 18  Temp: 98.1 F (36.7 C)  SpO2: 96%   Wt Readings from Last 3 Encounters:  03/27/22 124 lb 4.8 oz (56.4 kg)  09/19/21 126 lb 5.2 oz (57.3 kg)  03/07/21 127 lb 14.4 oz (58 kg)   Physical Exam Vitals reviewed.  Constitutional:      Appearance: Normal appearance.  Cardiovascular:     Rate and Rhythm: Normal rate and regular rhythm.     Pulses: Normal pulses.     Heart sounds: Normal heart sounds.  Pulmonary:     Effort: Pulmonary effort is normal.  Breath sounds: Normal breath sounds.  Musculoskeletal:     Right lower leg: No edema.     Left lower leg: No edema.  Neurological:     General: No focal deficit present.     Mental Status: She is alert and oriented to person, place, and time.  Psychiatric:        Mood and Affect: Mood normal.        Behavior: Behavior normal.      LABORATORY DATA:  I have reviewed the labs as listed.     Latest Ref Rng & Units 03/20/2022   12:38 PM 09/12/2021    1:54 PM 03/01/2021   11:46 AM  CBC  WBC 4.0 - 10.5 K/uL 10.8  11.2  11.4   Hemoglobin 12.0 - 15.0 g/dL 13.8  13.7  13.8   Hematocrit 36.0 - 46.0 % 41.6  40.5  41.6   Platelets 150 - 400 K/uL 358  389  348       Latest Ref Rng & Units 03/20/2022   12:38 PM 09/12/2021    1:54 PM 03/01/2021   11:46 AM  CMP  Glucose 70 - 99 mg/dL 102  97  97   BUN 8 - 23 mg/dL 14  10  16    Creatinine 0.44 - 1.00 mg/dL 0.82  0.87  0.89   Sodium 135 - 145 mmol/L 140  142  139   Potassium 3.5 - 5.1 mmol/L 4.3  3.9  3.8   Chloride 98 - 111 mmol/L 108  106  103   CO2 22 - 32 mmol/L 26  27  26    Calcium 8.9 - 10.3 mg/dL 10.0  9.9  9.5   Total Protein 6.5 - 8.1 g/dL 7.5  7.3  7.7   Total Bilirubin 0.3 - 1.2 mg/dL 0.4  0.4  0.5   Alkaline Phos 38 - 126 U/L 73  70  77   AST 15 - 41 U/L 19  19  19    ALT 0 - 44 U/L 15  12  11      DIAGNOSTIC  IMAGING:  I have independently reviewed the scans and discussed with the patient. CT Chest Wo Contrast  Result Date: 03/21/2022 CLINICAL DATA:  Non-small-cell lung cancer. Status post right upper lobe wedge resection and right lower lobe segmentectomy. Restaging. Insert body on EXAM: CT CHEST WITHOUT CONTRAST TECHNIQUE: Multidetector CT imaging of the chest was performed following the standard protocol without IV contrast. RADIATION DOSE REDUCTION: This exam was performed according to the departmental dose-optimization program which includes automated exposure control, adjustment of the mA and/or kV according to patient size and/or use of iterative reconstruction technique. COMPARISON:  09/12/2021 FINDINGS: Cardiovascular: The heart size is normal. No substantial pericardial effusion. Mild atherosclerotic calcification is noted in the wall of the thoracic aorta. Mediastinum/Nodes: No mediastinal lymphadenopathy. No evidence for gross hilar lymphadenopathy although assessment is limited by the lack of intravenous contrast on the current study. The esophagus has normal imaging features. There is no axillary lymphadenopathy. Lungs/Pleura: Stable appearance of surgical scarring anterior right lung and retro hilar right lower lobe. Centrilobular emphsyema noted. No new suspicious pulmonary nodule or mass. No focal airspace consolidation. No pleural effusion. Upper Abdomen: Unremarkable. Musculoskeletal: No worrisome lytic or sclerotic osseous abnormality. IMPRESSION: 1. Stable exam. No new or progressive findings. 2. Stable appearance of surgical scarring anterior right lung and retro hilar right lower lobe. 3. Aortic Atherosclerosis (ICD10-I70.0) and Emphysema (ICD10-J43.9). Electronically Signed   By: Verda Cumins.D.  On: 03/21/2022 11:28     ASSESSMENT:  1.  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 - Right upper lobe wedge and right lower lobe segmentectomy and lymph node dissection on 02/27/2019. -Pathology shows 3.3 cm invasive adenocarcinoma of the right lower lobe, positive visceral pleural invasion.  2.7 cm adenocarcinoma of the right upper lobe with no perineural invasion.  11 mediastinal and hilar lymph nodes are negative.  Pathological staging is PT4PN0.  Lymphovascular invasion negative.  Margins negative. - 4 cycles of adjuvant chemotherapy with carboplatin and pemetrexed from 04/04/2019 through 06/10/2019. -CT chest on 08/27/2020 shows stable findings with no evidence of recurrence.   PLAN:  1.  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 - Denies any chest pains, cough or pneumonia in the last 6 months. - Reviewed CT chest without contrast (03/20/2022): Stable exam with no evidence of recurrence.  Surgical scarring of anterior right lung and retrohilar right lower lobe. - Reviewed labs which showed normal LFTs and grossly normal CBC with mild leukocytosis. - Recommend follow-up in 6 months with repeat labs and CT scan.  If CT scan is stable, will consider switching to 1 year follow-up.   2.  Lymphocytosis: -She has intermittent leukocytosis and slightly elevated total leukocyte count. - She does not have any B symptoms or recurrent infections. - Previous flow cytometry from 09/12/2021 was negative for B-cell lymphoproliferative disorder.   Orders placed this encounter:  No orders of the defined types were placed in this encounter.    Derek Jack, MD [place] [phone]   I, Thana Ates, am acting as a scribe for Dr. Derek Jack.  I, Derek Jack MD, have reviewed the above documentation for accuracy and completeness, and I agree with the above.

## 2022-03-27 NOTE — Patient Instructions (Addendum)
[place] at [facility] Discharge Instructions  You were seen and examined today by Dr. Delton Coombes.  Dr. Delton Coombes discussed your most recent lab work and everything looks improved.   We will repeat CT chest before next visit.  Follow-up as scheduled in 6 months.    Thank you for choosing [place] at [facility] to provide your oncology and hematology care.  To afford each patient quality time with our provider, please arrive at least 15 minutes before your scheduled appointment time.   If you have a lab appointment with the [facility] please come in thru the Main Entrance and check in at the main information desk.  You need to re-schedule your appointment should you arrive 10 or more minutes late.  We strive to give you quality time with our providers, and arriving late affects you and other patients whose appointments are after yours.  Also, if you no show three or more times for appointments you may be dismissed from the clinic at the providers discretion.     Again, thank you for choosing [facility].  Our hope is that these requests will decrease the amount of time that you wait before being seen by our physicians.       _____________________________________________________________  Should you have questions after your visit to [facility], please contact our office at [phone] and follow the prompts.  Our office hours are 8:00 a.m. and 4:30 p.m. Monday - Friday.  Please note that voicemails left after 4:00 p.m. may not be returned until the following business day.  We are closed weekends and major holidays.  You do have access to a nurse 24-7, just call the main number to the clinic [phone] and do not press any options, hold on the line and a nurse will answer the phone.    For prescription refill requests, have your pharmacy contact our office and allow 72 hours.    Due to Covid, you will need  to wear a mask upon entering the hospital. If you do not have a mask, a mask will be given to you at the Main Entrance upon arrival. For doctor visits, patients may have 1 support person age 70 or older with them. For treatment visits, patients can not have anyone with them due to social distancing guidelines and our immunocompromised population.

## 2022-09-26 ENCOUNTER — Ambulatory Visit (HOSPITAL_COMMUNITY)
Admission: RE | Admit: 2022-09-26 | Discharge: 2022-09-26 | Disposition: A | Discharge status: Home / Self Care | Payer: Medicare HMO | Source: Ambulatory Visit | Attending: Hematology | Admitting: Hematology

## 2022-09-26 ENCOUNTER — Inpatient Hospital Stay: Payer: Medicare HMO | Attending: Hematology

## 2022-09-26 DIAGNOSIS — C3431 Malignant neoplasm of lower lobe, right bronchus or lung: Secondary | ICD-10-CM | POA: Diagnosis present

## 2022-09-26 DIAGNOSIS — R918 Other nonspecific abnormal finding of lung field: Secondary | ICD-10-CM

## 2022-09-26 DIAGNOSIS — Z87891 Personal history of nicotine dependence: Secondary | ICD-10-CM | POA: Insufficient documentation

## 2022-09-26 DIAGNOSIS — Z808 Family history of malignant neoplasm of other organs or systems: Secondary | ICD-10-CM | POA: Diagnosis not present

## 2022-09-26 DIAGNOSIS — Z801 Family history of malignant neoplasm of trachea, bronchus and lung: Secondary | ICD-10-CM | POA: Insufficient documentation

## 2022-09-26 DIAGNOSIS — D72828 Other elevated white blood cell count: Secondary | ICD-10-CM | POA: Insufficient documentation

## 2022-09-26 DIAGNOSIS — C3412 Malignant neoplasm of upper lobe, left bronchus or lung: Secondary | ICD-10-CM | POA: Insufficient documentation

## 2022-09-26 LAB — COMPREHENSIVE METABOLIC PANEL
ALT: 15 U/L (ref 0–44)
AST: 20 U/L (ref 15–41)
Albumin: 4.4 g/dL (ref 3.5–5.0)
Alkaline Phosphatase: 81 U/L (ref 38–126)
Anion gap: 9 (ref 5–15)
BUN: 16 mg/dL (ref 8–23)
CO2: 25 mmol/L (ref 22–32)
Calcium: 9.9 mg/dL (ref 8.9–10.3)
Chloride: 105 mmol/L (ref 98–111)
Creatinine, Ser: 0.85 mg/dL (ref 0.44–1.00)
GFR, Estimated: >60 mL/min (ref >60)
Glucose, Bld: 99 mg/dL (ref 70–99)
Potassium: 4 mmol/L (ref 3.5–5.1)
Sodium: 139 mmol/L (ref 135–145)
Total Bilirubin: 0.5 mg/dL (ref 0.3–1.2)
Total Protein: 7.6 g/dL (ref 6.5–8.1)

## 2022-09-26 LAB — CBC WITH DIFFERENTIAL/PLATELET
Abs Immature Granulocytes: 0.02 10*3/uL (ref 0.00–0.07)
Basophils Absolute: 0.1 10*3/uL (ref 0.0–0.1)
Basophils Relative: 1 %
Eosinophils Absolute: 0.1 10*3/uL (ref 0.0–0.5)
Eosinophils Relative: 1 %
HCT: 42.8 % (ref 36.0–46.0)
Hemoglobin: 14.4 g/dL (ref 12.0–15.0)
Immature Granulocytes: 0 %
Lymphocytes Relative: 35 %
Lymphs Abs: 4.2 10*3/uL — ABNORMAL HIGH (ref 0.7–4.0)
MCH: 30.8 pg (ref 26.0–34.0)
MCHC: 33.6 g/dL (ref 30.0–36.0)
MCV: 91.5 fL (ref 80.0–100.0)
Monocytes Absolute: 0.9 10*3/uL (ref 0.1–1.0)
Monocytes Relative: 7 %
Neutro Abs: 6.7 10*3/uL (ref 1.7–7.7)
Neutrophils Relative %: 56 %
Platelets: 347 10*3/uL (ref 150–400)
RBC: 4.68 MIL/uL (ref 3.87–5.11)
RDW: 12.6 % (ref 11.5–15.5)
WBC: 12 10*3/uL — ABNORMAL HIGH (ref 4.0–10.5)
nRBC: 0 % (ref 0.0–0.2)

## 2022-10-03 ENCOUNTER — Inpatient Hospital Stay: Payer: Medicare HMO | Admitting: Hematology

## 2022-10-03 VITALS — BP 108/73 | HR 94 | Temp 97.1°F | Resp 18 | Ht 65.0 in | Wt 117.5 lb

## 2022-10-03 DIAGNOSIS — C3412 Malignant neoplasm of upper lobe, left bronchus or lung: Secondary | ICD-10-CM | POA: Diagnosis not present

## 2022-10-03 DIAGNOSIS — F419 Anxiety disorder, unspecified: Secondary | ICD-10-CM | POA: Diagnosis not present

## 2022-10-03 DIAGNOSIS — R918 Other nonspecific abnormal finding of lung field: Secondary | ICD-10-CM | POA: Diagnosis not present

## 2022-10-03 MED ORDER — ALPRAZOLAM 0.25 MG PO TABS: 0.2500 mg | ORAL_TABLET | Freq: Two times a day (BID) | ORAL | 0 refills | Status: DC | PRN

## 2022-10-03 NOTE — Progress Notes (Signed)
Madeline Gates, [place] [id number]   CLINIC:  Medical Oncology/Hematology  PCP:  [facility] [address] [place] [address] [phone]   REASON FOR VISIT:  Follow-up for right lung cancer  PRIOR THERAPY:  1. Right upper lobe wedge and right lower lobe segmentectomy on 02/27/2019. 2. Carboplatin and pemetrexed x 4 cycles from 04/04/2019 to 06/10/2019.  NGS Results: not done  CURRENT THERAPY: surveillance  BRIEF ONCOLOGIC HISTORY:  Oncology History  Primary lung adenocarcinoma, right ([facility])  03/24/2019 Initial Diagnosis   Primary lung adenocarcinoma, right ([facility])   03/24/2019 Cancer Staging   Staging form: Lung, AJCC 8th Edition - Clinical: Stage Occult (cTX, cN0, cM0) - Signed by Derek Jack, MD on 03/24/2019   03/24/2019 Cancer Staging   Staging form: Lung, AJCC 8th Edition - Pathologic: Stage IIIA (pT4, pN0, cM0) - Signed by Derek Jack, MD on 03/24/2019   04/04/2019 - 06/10/2019 Chemotherapy   Patient is on Treatment Plan : LUNG NSCLC Pemetrexed ([place]) / Carboplatin q21d x 4 cycles       CANCER STAGING:  Cancer Staging  Primary lung adenocarcinoma, right ([facility]) Staging form: Lung, AJCC 8th Edition - Clinical: Stage Occult (cTX, cN0, cM0) - Signed by Derek Jack, MD on 03/24/2019 - Pathologic: Stage IIIA (pT4, pN0, cM0) - Signed by Derek Jack, MD on 03/24/2019   INTERVAL HISTORY:  [name], a 66 y.o. female, seen for follow-up of right lung cancer.  Denies any recent respiratory infections.  Reports energy levels 85%.  Reports that she has been working on remodeling her house and as a result lost some weight.  She is 7 pounds lighter than at her previous visit.  She was smoking 1 to 2 cigarettes/day but quit completely 2 months ago.  REVIEW OF SYSTEMS:  Review of Systems  Constitutional:  Negative for appetite change and fatigue.  Respiratory:  Negative for cough.   Cardiovascular:   Negative for chest pain.  Psychiatric/Behavioral:  The patient is nervous/anxious.   All other systems reviewed and are negative.   PAST MEDICAL/SURGICAL HISTORY:  Past Medical History:  Diagnosis Date   Anxiety    IBS (irritable bowel syndrome)    Multiple lung nodules    Past Surgical History:  Procedure Laterality Date   SEGMENTECOMY Right 02/27/2019   Procedure: RIGHT UPPER WEDGE AND RIGHT LOWER SEGMENTECTOMY LYMPH NODE DISSECTION;  Surgeon: [name], MD;  Location: [facility] OR;  Service: Thoracic;  Laterality: Right;   TONSILLECTOMY     TUBAL LIGATION     VIDEO ASSISTED THORACOSCOPY Right 02/27/2019   Procedure: VIDEO ASSISTED THORACOSCOPY;  Surgeon: [name], MD;  Location: [facility];  Service: Thoracic;  Laterality: Right;    SOCIAL HISTORY:  Social History   Socioeconomic History   Marital status: Married    Spouse name: Percell Miller   Number of children: 1   Years of education: Not on file   Highest education level: Not on file  Occupational History   Occupation: Forensic psychologist  Tobacco Use   Smoking status: Former    Packs/day: 1.00    Types: Cigarettes    Quit date: 01/26/2019    Years since quitting: 3.6   Smokeless tobacco: Never  Vaping Use   Vaping Use: Never used  Substance and Sexual Activity   Alcohol use: Not Currently   Drug use: Not Currently   Sexual activity: Not on file  Other Topics Concern   Not  on file  Social History Narrative   Not on file   Social Determinants of Health   Financial Resource Strain: Low Risk  (01/30/2019)   Overall Financial Resource Strain (CARDIA)    Difficulty of Paying Living Expenses: Not hard at all  Food Insecurity: No Food Insecurity (01/30/2019)   Hunger Vital Sign    Worried About Running Out of Food in the Last Year: Never true    [place] in the Last Year: Never true  Transportation Needs: Unmet Transportation Needs (01/30/2019)   PRAPARE - Transportation    Lack of Transportation  (Medical): Yes    Lack of Transportation (Non-Medical): Yes  Physical Activity: Insufficiently Active (01/30/2019)   Exercise Vital Sign    Days of Exercise per Week: 2 days    Minutes of Exercise per Session: 20 min  Stress: Stress Concern Present (01/30/2019)   [place]    Feeling of Stress : Rather much  Social Connections: Somewhat Isolated (01/30/2019)   Social Connection and Isolation Panel [NHANES]    Frequency of Communication with Friends and Family: More than three times a week    Frequency of Social Gatherings with Friends and Family: Twice a week    Attends Religious Services: Never    Marine scientist or Organizations: No    Attends Archivist Meetings: Never    Marital Status: Married  Human resources officer Violence: Not At Risk (01/30/2019)   Humiliation, Afraid, Rape, and Kick questionnaire    Fear of Current or Ex-Partner: No    Emotionally Abused: No    Physically Abused: No    Sexually Abused: No    FAMILY HISTORY:  Family History  Problem Relation Age of Onset   Myelodysplastic syndrome Mother    Clotting disorder Father    Lung cancer Paternal Aunt     CURRENT MEDICATIONS:  Current Outpatient Medications  Medication Sig Dispense Refill   atorvastatin (LIPITOR) 20 MG tablet Take 20 mg by mouth daily.     Calcium Carb-Cholecalciferol 600-800 MG-UNIT TABS Take 1 tablet by mouth daily.     methylPREDNISolone (MEDROL DOSEPAK) 4 MG TBPK tablet Take by mouth.     oxymetazoline (AFRIN) 0.05 % nasal spray Place 1 spray into both nostrils 2 (two) times daily as needed for congestion.     sertraline (ZOLOFT) 100 MG tablet Take 1 tablet by mouth daily.     sodium chloride (OCEAN) 0.65 % SOLN nasal spray Place 1 spray into both nostrils as needed for congestion.     ALPRAZolam (XANAX) 0.25 MG tablet Take 1 tablet (0.25 mg total) by mouth 2 (two) times daily as needed for anxiety. 60 tablet 0    No current facility-administered medications for this visit.    ALLERGIES:  Allergies  Allergen Reactions   Meperidine Hcl Swelling    Swelling around IV site Swelling around IV site   Aspirin Diarrhea   Ibuprofen Diarrhea and Nausea And Vomiting    Also give her heartburn     PHYSICAL EXAM:  Performance status (ECOG): 0 - Asymptomatic  Vitals:   10/03/22 1356  BP: 108/73  Pulse: 94  Resp: 18  Temp: (!) 97.1 F (36.2 C)  SpO2: 99%   Wt Readings from Last 3 Encounters:  10/03/22 117 lb 8 oz (53.3 kg)  03/27/22 124 lb 4.8 oz (56.4 kg)  09/19/21 126 lb 5.2 oz (57.3 kg)   Physical Exam Vitals reviewed.  Constitutional:  Appearance: Normal appearance.  Cardiovascular:     Rate and Rhythm: Normal rate and regular rhythm.     Pulses: Normal pulses.     Heart sounds: Normal heart sounds.  Pulmonary:     Effort: Pulmonary effort is normal.     Breath sounds: Normal breath sounds.  Musculoskeletal:     Right lower leg: No edema.     Left lower leg: No edema.  Neurological:     General: No focal deficit present.     Mental Status: She is alert and oriented to person, place, and time.  Psychiatric:        Mood and Affect: Mood normal.        Behavior: Behavior normal.      LABORATORY DATA:  I have reviewed the labs as listed.     Latest Ref Rng & Units 09/26/2022    1:58 PM 03/20/2022   12:38 PM 09/12/2021    1:54 PM  CBC  WBC 4.0 - 10.5 K/uL 12.0  10.8  11.2   Hemoglobin 12.0 - 15.0 g/dL 14.4  13.8  13.7   Hematocrit 36.0 - 46.0 % 42.8  41.6  40.5   Platelets 150 - 400 K/uL 347  358  389       Latest Ref Rng & Units 09/26/2022    1:58 PM 03/20/2022   12:38 PM 09/12/2021    1:54 PM  CMP  Glucose 70 - 99 mg/dL 99  102  97   BUN 8 - 23 mg/dL 16  14  10    Creatinine 0.44 - 1.00 mg/dL 0.85  0.82  0.87   Sodium 135 - 145 mmol/L 139  140  142   Potassium 3.5 - 5.1 mmol/L 4.0  4.3  3.9   Chloride 98 - 111 mmol/L 105  108  106   CO2 22 - 32 mmol/L 25  26   27    Calcium 8.9 - 10.3 mg/dL 9.9  10.0  9.9   Total Protein 6.5 - 8.1 g/dL 7.6  7.5  7.3   Total Bilirubin 0.3 - 1.2 mg/dL 0.5  0.4  0.4   Alkaline Phos 38 - 126 U/L 81  73  70   AST 15 - 41 U/L 20  19  19    ALT 0 - 44 U/L 15  15  12      DIAGNOSTIC IMAGING:  I have independently reviewed the scans and discussed with the patient. CT Chest Wo Contrast  Result Date: 09/27/2022 CLINICAL DATA:  A 66 year old female presents for follow-up of multifocal bronchogenic neoplasm post wedge resection and chemotherapy. Initial diagnosis in 2020. * Tracking Code: BO * EXAM: CT CHEST WITHOUT CONTRAST TECHNIQUE: Multidetector CT imaging of the chest was performed following the standard protocol without IV contrast. RADIATION DOSE REDUCTION: This exam was performed according to the departmental dose-optimization program which includes automated exposure control, adjustment of the mA and/or kV according to patient size and/or use of iterative reconstruction technique. COMPARISON:  March 20, 2022 FINDINGS: Cardiovascular: Normal heart size. Normal caliber of the thoracic aorta. Scattered aortic atherosclerotic changes. Normal caliber of central pulmonary vessels. Distortion of RIGHT hilum following partial lung resection in the RIGHT chest. Limited assessment of cardiovascular structures given lack of intravenous contrast. Mediastinum/Nodes: Esophagus grossly normal. No signs of adenopathy in the chest. Lungs/Pleura: Parenchymal distortion following wedge resections in the RIGHT chest with similar appearance to prior imaging. There is an area about the RIGHT suprahilar region adjacent to suture lines that  is predominantly cystic but displays a perceptible thin rim of tissue and some associated septal thickening (image 60/4) this area measures 11 x 10 mm, previously 11 x 10 mm as far back as November of 2022, in November of 2021 the delineation between this more cystic appearing peribronchial structure is less apparent  than on the current study. This is unchanged compared to recent imaging from June. Likewise a 2 mm nodule on image 31/4 is unchanged. Stable RIGHT upper lobe nodule (image 39/4) 2 mm. Stable fissural distortion adjacent to RIGHT lower lobe wedge resection changes. Background pulmonary emphysema as before. No consolidation or effusion. Upper Abdomen: Incidental imaging of upper abdominal contents shows no acute abnormality to the extent evaluated. Musculoskeletal: No chest wall mass. No acute bone finding. No destructive bone process. Spinal degenerative changes. IMPRESSION: 1. Postoperative changes following partial lung resection in the RIGHT chest. 2. Cystic area with subtle ground-glass and thin perceptible rim about bronchovascular structures adjacent to surgical changes in the RIGHT upper lobe. This has become more conspicuous over time raising the question of indolent bronchogenic neoplasm in this location. 3. Emphysema and aortic atherosclerosis. Aortic Atherosclerosis (ICD10-I70.0) and Emphysema (ICD10-J43.9). Electronically Signed   By: Zetta Bills M.D.   On: 09/27/2022 12:29     ASSESSMENT:  1.  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 - Right upper lobe wedge and right lower lobe segmentectomy and lymph node dissection on 02/27/2019. -Pathology shows 3.3 cm invasive adenocarcinoma of the right lower lobe, positive visceral pleural invasion.  2.7 cm adenocarcinoma of the right upper lobe with no perineural invasion.  11 mediastinal and hilar lymph nodes are negative.  Pathological staging is PT4PN0.  Lymphovascular invasion negative.  Margins negative. - 4 cycles of adjuvant chemotherapy with carboplatin and pemetrexed from 04/04/2019 through 06/10/2019. -CT chest on 08/27/2020 shows stable findings with no evidence of recurrence.   PLAN:  1.  Stage III (T4N0)  adenocarcinoma the right lung: - She reportedly quit smoking 2 months ago. - Denies any recent lung infections or B symptoms.  She lost about 7 pounds but was reportedly working on her house and did not pay attention to eating. - Reviewed labs from 09/26/2022 which showed normal LFTs.  CBC was grossly normal with white count slightly elevated. - CT chest (09/26/2022): Cystic area with subtle groundglass and thin rim about the bronchovascular structures adjacent to the surgical changes in the right upper lobe.  This has become more conspicuous when compared to CT scan from 2021.  Otherwise no suspicious lesions. - Recommend RTC 6 months with repeat CT scan.  If there is any significant change, consider PET scan.   2.  Lymphocytic leukocytosis: - She has intermittent leukocytosis with slightly elevated lymphocyte count. - Previous flow cytometry on 09/12/2021 was negative for B-cell lymphoproliferative process. - She does not have any B symptoms..   Orders placed this encounter:  Orders Placed This Encounter  Procedures   CT Chest Wo Contrast   CBC with Differential/Platelet   Comprehensive metabolic panel      Derek Jack, MD [place] [phone]

## 2022-10-03 NOTE — Patient Instructions (Signed)
[facility]  Discharge Instructions  You were seen and examined today by Dr. Delton Coombes.  Dr. Delton Coombes discussed your most recent lab work and CT scan which revealed that everything looks stable the spot on the lung has not changed much.   Follow-up as scheduled in 6 months.    Thank you for choosing [facility] to provide your oncology and hematology care.   To afford each patient quality time with our provider, please arrive at least 15 minutes before your scheduled appointment time. You may need to reschedule your appointment if you arrive late (10 or more minutes). Arriving late affects you and other patients whose appointments are after yours.  Also, if you miss three or more appointments without notifying the office, you may be dismissed from the clinic at the provider's discretion.    Again, thank you for choosing [facility].  Our hope is that these requests will decrease the amount of time that you wait before being seen by our physicians.   If you have a lab appointment with the [facility] please come in thru the Main Entrance and check in at the main information desk.           _____________________________________________________________  Should you have questions after your visit to [facility], please contact our office at [phone] and follow the prompts.  Our office hours are 8:00 a.m. to 4:30 p.m. Monday - Thursday and 8:00 a.m. to 2:30 p.m. Friday.  Please note that voicemails left after 4:00 p.m. may not be returned until the following business day.  We are closed weekends and all major holidays.  You do have access to a nurse 24-7, just call the main number to the clinic [phone] and do not press any options, hold on the line and a nurse will answer the phone.    For prescription refill requests, have your pharmacy contact our office and allow 72 hours.    Masks are optional in  the cancer centers. If you would like for your care team to wear a mask while they are taking care of you, please let them know. You may have one support person who is at least 67 years old accompany you for your appointments.

## 2023-04-04 ENCOUNTER — Inpatient Hospital Stay: Payer: Medicare HMO | Attending: Hematology and Oncology

## 2023-04-04 ENCOUNTER — Ambulatory Visit (HOSPITAL_COMMUNITY)
Admission: RE | Admit: 2023-04-04 | Discharge: 2023-04-04 | Disposition: A | Discharge status: Home / Self Care | Payer: Medicare HMO | Source: Ambulatory Visit | Attending: Hematology | Admitting: Hematology

## 2023-04-04 DIAGNOSIS — R918 Other nonspecific abnormal finding of lung field: Secondary | ICD-10-CM

## 2023-04-04 DIAGNOSIS — D72829 Elevated white blood cell count, unspecified: Secondary | ICD-10-CM | POA: Insufficient documentation

## 2023-04-04 DIAGNOSIS — Z85118 Personal history of other malignant neoplasm of bronchus and lung: Secondary | ICD-10-CM | POA: Insufficient documentation

## 2023-04-04 DIAGNOSIS — Z87891 Personal history of nicotine dependence: Secondary | ICD-10-CM | POA: Insufficient documentation

## 2023-04-04 LAB — CBC WITH DIFFERENTIAL/PLATELET
Abs Immature Granulocytes: 0.03 10*3/uL (ref 0.00–0.07)
Basophils Absolute: 0.1 10*3/uL (ref 0.0–0.1)
Basophils Relative: 1 %
Eosinophils Absolute: 0.1 10*3/uL (ref 0.0–0.5)
Eosinophils Relative: 1 %
HCT: 41.8 % (ref 36.0–46.0)
Hemoglobin: 13.9 g/dL (ref 12.0–15.0)
Immature Granulocytes: 0 %
Lymphocytes Relative: 35 %
Lymphs Abs: 3.7 10*3/uL (ref 0.7–4.0)
MCH: 30.9 pg (ref 26.0–34.0)
MCHC: 33.3 g/dL (ref 30.0–36.0)
MCV: 92.9 fL (ref 80.0–100.0)
Monocytes Absolute: 0.7 10*3/uL (ref 0.1–1.0)
Monocytes Relative: 7 %
Neutro Abs: 6 10*3/uL (ref 1.7–7.7)
Neutrophils Relative %: 56 %
Platelets: 390 10*3/uL (ref 150–400)
RBC: 4.5 MIL/uL (ref 3.87–5.11)
RDW: 12.9 % (ref 11.5–15.5)
WBC: 10.7 10*3/uL — ABNORMAL HIGH (ref 4.0–10.5)
nRBC: 0 % (ref 0.0–0.2)

## 2023-04-04 LAB — COMPREHENSIVE METABOLIC PANEL
ALT: 12 U/L (ref 0–44)
AST: 17 U/L (ref 15–41)
Albumin: 4.2 g/dL (ref 3.5–5.0)
Alkaline Phosphatase: 66 U/L (ref 38–126)
Anion gap: 10 (ref 5–15)
BUN: 11 mg/dL (ref 8–23)
CO2: 26 mmol/L (ref 22–32)
Calcium: 9.4 mg/dL (ref 8.9–10.3)
Chloride: 101 mmol/L (ref 98–111)
Creatinine, Ser: 0.81 mg/dL (ref 0.44–1.00)
GFR, Estimated: >60 mL/min (ref >60)
Glucose, Bld: 101 mg/dL — ABNORMAL HIGH (ref 70–99)
Potassium: 4 mmol/L (ref 3.5–5.1)
Sodium: 137 mmol/L (ref 135–145)
Total Bilirubin: 0.7 mg/dL (ref 0.3–1.2)
Total Protein: 7.4 g/dL (ref 6.5–8.1)

## 2023-04-11 ENCOUNTER — Inpatient Hospital Stay: Payer: Medicare HMO | Admitting: Hematology

## 2023-04-11 VITALS — BP 109/89 | HR 70 | Temp 98.2°F | Resp 17 | Ht 65.0 in | Wt 113.8 lb

## 2023-04-11 DIAGNOSIS — Z85118 Personal history of other malignant neoplasm of bronchus and lung: Secondary | ICD-10-CM | POA: Diagnosis present

## 2023-04-11 DIAGNOSIS — C3491 Malignant neoplasm of unspecified part of right bronchus or lung: Secondary | ICD-10-CM

## 2023-04-11 DIAGNOSIS — Z87891 Personal history of nicotine dependence: Secondary | ICD-10-CM | POA: Diagnosis not present

## 2023-04-11 DIAGNOSIS — D72829 Elevated white blood cell count, unspecified: Secondary | ICD-10-CM | POA: Diagnosis not present

## 2023-04-11 NOTE — Progress Notes (Signed)
[facility] [address], [place] [address]    Clinic Day:  04/12/2023  Referring physician: [facility]  Patient Care Team: [facility] as PCP - General   ASSESSMENT & PLAN:   Assessment: 1.  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 - Right upper lobe wedge and right lower lobe segmentectomy and lymph node dissection on 02/27/2019. -Pathology shows 3.3 cm invasive adenocarcinoma of the right lower lobe, positive visceral pleural invasion.  2.7 cm adenocarcinoma of the right upper lobe with no perineural invasion.  11 mediastinal and hilar lymph nodes are negative.  Pathological staging is PT4PN0.  Lymphovascular invasion negative.  Margins negative. - 4 cycles of adjuvant chemotherapy with carboplatin and pemetrexed from 04/04/2019 through 06/10/2019. -CT chest on 08/27/2020 shows stable findings with no evidence of recurrence. - Quit smoking 07/2022.  Plan: 1.  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 - Denies any cough or chest pains. - Reviewed labs from 03/25/2023: Normal LFTs and CBC. - CT chest (04/04/2023): Stable exam with no findings of recurrence. - RTC 1 year with repeat CT of the chest and labs.   2.  Lymphocytic leukocytosis: - She has intermittent leukocytosis with slightly elevated lymphocytes. - Previous flow cytometry in November 2022 was negative for B-cell lymphoproliferative process.  She does not have any B symptoms.  Orders Placed This Encounter  Procedures   CT Chest Wo Contrast    Standing Status:   Future    Standing Expiration Date:   04/10/2024    Order Specific Question:   Preferred imaging location?    Answer:   [facility]    Order Specific Question:   Release to patient    Answer:   Immediate [1]   CBC with  Differential/Platelet    Standing Status:   Future    Standing Expiration Date:   04/10/2024    Order Specific Question:   Release to patient    Answer:   Immediate   Comprehensive metabolic panel    Standing Status:   Future    Standing Expiration Date:   04/10/2024    Order Specific Question:   Release to patient    Answer:   Immediate      I,Katie Daubenspeck,acting as a scribe for Doreatha Massed, MD.,have documented all relevant documentation on the behalf of Doreatha Massed, MD,as directed by  Doreatha Massed, MD while in the presence of Doreatha Massed, MD.   I, Doreatha Massed MD, have reviewed the above documentation for accuracy and completeness, and I agree with the above.   Doreatha Massed, MD   6/27/20246:03 PM  CHIEF COMPLAINT:   Diagnosis: right lung cancer    Cancer Staging  Primary lung adenocarcinoma, right (HCC) Staging form: Lung, AJCC 8th Edition - Clinical: Stage Occult (cTX, cN0, cM0) - Signed by Doreatha Massed, MD on 03/24/2019 - Pathologic: Stage IIIA (pT4, pN0, cM0) - Signed by Doreatha Massed, MD on 03/24/2019    Prior Therapy: 1. Right upper lobe wedge and right lower lobe segmentectomy on 02/27/2019. 2. Carboplatin and pemetrexed x 4 cycles from 04/04/2019 to 06/10/2019  Current Therapy:  surveillance    HISTORY OF PRESENT ILLNESS:   Oncology History  Primary lung adenocarcinoma, right (HCC)  03/24/2019 Initial Diagnosis   Primary lung adenocarcinoma, right (HCC)   03/24/2019 Cancer  Staging   Staging form: Lung, AJCC 8th Edition - Clinical: Stage Occult (cTX, cN0, cM0) - Signed by Doreatha Massed, MD on 03/24/2019   03/24/2019 Cancer Staging   Staging form: Lung, AJCC 8th Edition - Pathologic: Stage IIIA (pT4, pN0, cM0) - Signed by Doreatha Massed, MD on 03/24/2019   04/04/2019 - 06/10/2019 Chemotherapy   Patient is on Treatment Plan : LUNG NSCLC Pemetrexed (Alimta) / Carboplatin q21d x 4 cycles         INTERVAL HISTORY:   Tobin is a 67 y.o. female presenting to clinic today for follow up of right lung cancer. She was last seen by me on 10/03/22.  Since her last visit, she underwent surveillance chest CT on 04/04/23 showing: stable exam; no specific findings to suggest recurrent or metastatic disease.  Today, she states that she is doing well overall. Her appetite level is at 100%. Her energy level is at 85%.  PAST MEDICAL HISTORY:   Past Medical History: Past Medical History:  Diagnosis Date   Anxiety    IBS (irritable bowel syndrome)    Multiple lung nodules     Surgical History: Past Surgical History:  Procedure Laterality Date   SEGMENTECOMY Right 02/27/2019   Procedure: RIGHT UPPER WEDGE AND RIGHT LOWER SEGMENTECTOMY LYMPH NODE DISSECTION;  Surgeon: Loreli Slot, MD;  Location: [facility] OR;  Service: Thoracic;  Laterality: Right;   TONSILLECTOMY     TUBAL LIGATION     VIDEO ASSISTED THORACOSCOPY Right 02/27/2019   Procedure: VIDEO ASSISTED THORACOSCOPY;  Surgeon: Loreli Slot, MD;  Location: [facility] OR;  Service: Thoracic;  Laterality: Right;    Social History: Social History   Socioeconomic History   Marital status: Married    Spouse name: Ramon Dredge   Number of children: 1   Years of education: Not on file   Highest education level: Not on file  Occupational History   Occupation: Teacher, early years/pre  Tobacco Use   Smoking status: Former    Packs/day: 1    Types: Cigarettes    Quit date: 01/26/2019    Years since quitting: 4.2   Smokeless tobacco: Never  Vaping Use   Vaping Use: Never used  Substance and Sexual Activity   Alcohol use: Not Currently   Drug use: Not Currently   Sexual activity: Not on file  Other Topics Concern   Not on file  Social History Narrative   Not on file   Social Determinants of Health   Financial Resource Strain: Low Risk  (01/30/2019)   Overall Financial Resource Strain (CARDIA)    Difficulty of Paying Living Expenses: Not  hard at all  Food Insecurity: No Food Insecurity (01/30/2019)   Hunger Vital Sign    Worried About Running Out of Food in the Last Year: Never true    Ran Out of Food in the Last Year: Never true  Transportation Needs: Unmet Transportation Needs (01/30/2019)   PRAPARE - Transportation    Lack of Transportation (Medical): Yes    Lack of Transportation (Non-Medical): Yes  Physical Activity: Insufficiently Active (01/30/2019)   Exercise Vital Sign    Days of Exercise per Week: 2 days    Minutes of Exercise per Session: 20 min  Stress: Stress Concern Present (01/30/2019)   [employer] of Occupational Health - Occupational Stress Questionnaire    Feeling of Stress : Rather much  Social Connections: Somewhat Isolated (01/30/2019)   Social Connection and Isolation Panel [NHANES]    Frequency of Communication with Friends and Family:  More than three times a week    Frequency of Social Gatherings with Friends and Family: Twice a week    Attends Religious Services: Never    Database administrator or Organizations: No    Attends Banker Meetings: Never    Marital Status: Married  Catering manager Violence: Not At Risk (01/30/2019)   Humiliation, Afraid, Rape, and Kick questionnaire    Fear of Current or Ex-Partner: No    Emotionally Abused: No    Physically Abused: No    Sexually Abused: No    Family History: Family History  Problem Relation Age of Onset   Myelodysplastic syndrome Mother    Clotting disorder Father    Lung cancer Paternal Aunt     Current Medications:  Current Outpatient Medications:    ALPRAZolam (XANAX) 0.25 MG tablet, Take 1 tablet (0.25 mg total) by mouth 2 (two) times daily as needed for anxiety., Disp: 60 tablet, Rfl: 0   atorvastatin (LIPITOR) 20 MG tablet, Take 20 mg by mouth daily., Disp: , Rfl:    Calcium Carb-Cholecalciferol 600-800 MG-UNIT TABS, Take 1 tablet by mouth daily., Disp: , Rfl:    methylPREDNISolone (MEDROL DOSEPAK) 4 MG TBPK  tablet, Take by mouth., Disp: , Rfl:    oxymetazoline (AFRIN) 0.05 % nasal spray, Place 1 spray into both nostrils 2 (two) times daily as needed for congestion., Disp: , Rfl:    sertraline (ZOLOFT) 100 MG tablet, Take 1 tablet by mouth daily., Disp: , Rfl:    sodium chloride (OCEAN) 0.65 % SOLN nasal spray, Place 1 spray into both nostrils as needed for congestion., Disp: , Rfl:    Allergies: Allergies  Allergen Reactions   Meperidine Hcl Swelling    Swelling around IV site Swelling around IV site   Aspirin Diarrhea   Ibuprofen Diarrhea and Nausea And Vomiting    Also give her heartburn     REVIEW OF SYSTEMS:   Review of Systems  Constitutional:  Negative for chills, fatigue and fever.  HENT:   Negative for lump/mass, mouth sores, nosebleeds, sore throat and trouble swallowing.   Eyes:  Negative for eye problems.  Respiratory:  Negative for cough and shortness of breath.   Cardiovascular:  Negative for chest pain, leg swelling and palpitations.  Gastrointestinal:  Negative for abdominal pain, constipation, diarrhea, nausea and vomiting.  Genitourinary:  Negative for bladder incontinence, difficulty urinating, dysuria, frequency, hematuria and nocturia.   Musculoskeletal:  Negative for arthralgias, back pain, flank pain, myalgias and neck pain.  Skin:  Negative for itching and rash.  Neurological:  Negative for dizziness, headaches and numbness.  Hematological:  Does not bruise/bleed easily.  Psychiatric/Behavioral:  Positive for depression. Negative for sleep disturbance and suicidal ideas. The patient is not nervous/anxious.   All other systems reviewed and are negative.    VITALS:   Blood pressure 109/89, pulse 70, temperature 98.2 F (36.8 C), temperature source Oral, resp. rate 17, height 5\' 5"  (1.651 m), weight 113 lb 12.8 oz (51.6 kg), SpO2 98 %.  Wt Readings from Last 3 Encounters:  04/11/23 113 lb 12.8 oz (51.6 kg)  10/03/22 117 lb 8 oz (53.3 kg)  03/27/22 124 lb 4.8  oz (56.4 kg)    Body mass index is 18.94 kg/m.  Performance status (ECOG): 1 - Symptomatic but completely ambulatory  PHYSICAL EXAM:   Physical Exam Vitals and nursing note reviewed. Exam conducted with a chaperone present.  Constitutional:      Appearance: Normal appearance.  Cardiovascular:     Rate and Rhythm: Normal rate and regular rhythm.     Pulses: Normal pulses.     Heart sounds: Normal heart sounds.  Pulmonary:     Effort: Pulmonary effort is normal.     Breath sounds: Normal breath sounds.  Abdominal:     Palpations: Abdomen is soft. There is no hepatomegaly, splenomegaly or mass.     Tenderness: There is no abdominal tenderness.  Musculoskeletal:     Right lower leg: No edema.     Left lower leg: No edema.  Lymphadenopathy:     Cervical: No cervical adenopathy.     Right cervical: No superficial, deep or posterior cervical adenopathy.    Left cervical: No superficial, deep or posterior cervical adenopathy.     Upper Body:     Right upper body: No supraclavicular or axillary adenopathy.     Left upper body: No supraclavicular or axillary adenopathy.  Neurological:     General: No focal deficit present.     Mental Status: She is alert and oriented to person, place, and time.  Psychiatric:        Mood and Affect: Mood normal.        Behavior: Behavior normal.     LABS:      Latest Ref Rng & Units 04/04/2023   12:53 PM 09/26/2022    1:58 PM 03/20/2022   12:38 PM  CBC  WBC 4.0 - 10.5 K/uL 10.7  12.0  10.8   Hemoglobin 12.0 - 15.0 g/dL 64.4  03.4  74.2   Hematocrit 36.0 - 46.0 % 41.8  42.8  41.6   Platelets 150 - 400 K/uL 390  347  358       Latest Ref Rng & Units 04/04/2023   12:53 PM 09/26/2022    1:58 PM 03/20/2022   12:38 PM  CMP  Glucose 70 - 99 mg/dL 595  99  638   BUN 8 - 23 mg/dL 11  16  14    Creatinine 0.44 - 1.00 mg/dL 7.56  4.33  2.95   Sodium 135 - 145 mmol/L 137  139  140   Potassium 3.5 - 5.1 mmol/L 4.0  4.0  4.3   Chloride 98 - 111  mmol/L 101  105  108   CO2 22 - 32 mmol/L 26  25  26    Calcium 8.9 - 10.3 mg/dL 9.4  9.9  18.8   Total Protein 6.5 - 8.1 g/dL 7.4  7.6  7.5   Total Bilirubin 0.3 - 1.2 mg/dL 0.7  0.5  0.4   Alkaline Phos 38 - 126 U/L 66  81  73   AST 15 - 41 U/L 17  20  19    ALT 0 - 44 U/L 12  15  15       No results found for: "CEA1", "CEA" / No results found for: "CEA1", "CEA" No results found for: "PSA1" No results found for: "CZY606" No results found for: "CAN125"  No results found for: "TOTALPROTELP", "ALBUMINELP", "A1GS", "A2GS", "BETS", "BETA2SER", "GAMS", "MSPIKE", "SPEI" No results found for: "TIBC", "FERRITIN", "IRONPCTSAT" No results found for: "LDH"   STUDIES:   CT Chest Wo Contrast  Result Date: 04/09/2023 CLINICAL DATA:  History of non-small cell lung cancer. Status post right upper lobe wedge resection and right lower lobe segmentectomy. Restaging. * Tracking Code: BO * EXAM: CT CHEST WITHOUT CONTRAST TECHNIQUE: Multidetector CT imaging of the chest was performed following the standard protocol without IV contrast.  RADIATION DOSE REDUCTION: This exam was performed according to the departmental dose-optimization program which includes automated exposure control, adjustment of the mA and/or kV according to patient size and/or use of iterative reconstruction technique. COMPARISON:  09/26/2022 FINDINGS: Cardiovascular: Heart size is normal. No pericardial effusion. Aortic atherosclerosis. No pericardial effusion. Mediastinum/Nodes: No enlarged mediastinal or axillary lymph nodes. Thyroid gland, trachea and esophagus are unremarkable. Lungs/Pleura: Postsurgical changes from right upper lobe wedge resection and right lower lobe segmentectomy. Emphysema. No pleural fluid, airspace consolidation, atelectasis or pneumothorax. -Ground-glass and cystic change surrounding the area of postsurgical change within the posteromedial right upper lobe is stable from the previous exam measuring 1.5 x 1.2 cm, image  52/4. -Medial right upper lobe nodule is also unchanged measuring 3 mm, image 35/4. No new or progressive findings identified within the lungs. Upper Abdomen: No acute abnormality. Musculoskeletal: No chest wall mass or suspicious bone lesions identified. IMPRESSION: 1. Stable exam. No specific findings identified to suggest recurrent or metastatic disease in the chest. 2. Aortic Atherosclerosis (ICD10-I70.0) and Emphysema (ICD10-J43.9). Electronically Signed   By: Signa Kell M.D.   On: 04/09/2023 07:46

## 2023-04-11 NOTE — Patient Instructions (Addendum)
[facility]  Discharge Instructions  You were seen and examined today by Dr. Ellin Saba.  Dr. Ellin Saba discussed your most recent lab work and CT scan which revealed that everything looks good.  Follow-up as scheduled in 1 year.    Thank you for choosing  [facility] to provide your oncology and hematology care.   To afford each patient quality time with our provider, please arrive at least 15 minutes before your scheduled appointment time. You may need to reschedule your appointment if you arrive late (10 or more minutes). Arriving late affects you and other patients whose appointments are after yours.  Also, if you miss three or more appointments without notifying the office, you may be dismissed from the clinic at the provider's discretion.    Again, thank you for choosing [facility].  Our hope is that these requests will decrease the amount of time that you wait before being seen by our physicians.   If you have a lab appointment with the Cancer Center - please note that after April 8th, all labs will be drawn in the cancer center.  You do not have to check in or register with the main entrance as you have in the past but will complete your check-in at the cancer center.            _____________________________________________________________  Should you have questions after your visit to [facility], please contact our office at [phone] and follow the prompts.  Our office hours are 8:00 a.m. to 4:30 p.m. Monday - Thursday and 8:00 a.m. to 2:30 p.m. Friday.  Please note that voicemails left after 4:00 p.m. may not be returned until the following business day.  We are closed weekends and all major holidays.  You do have access to a nurse 24-7, just call the main number to the clinic [phone] and do not press any options, hold on the line and a nurse will answer the phone.    For prescription refill  requests, have your pharmacy contact our office and allow 72 hours.    Masks are no longer required in the cancer centers. If you would like for your care team to wear a mask while they are taking care of you, please let them know. You may have one support person who is at least 68 years old accompany you for your appointments.

## 2023-08-14 DIAGNOSIS — J0100 Acute maxillary sinusitis, unspecified: Secondary | ICD-10-CM | POA: Diagnosis not present

## 2023-08-14 DIAGNOSIS — R7302 Impaired glucose tolerance (oral): Secondary | ICD-10-CM | POA: Diagnosis not present

## 2023-08-14 DIAGNOSIS — Z85118 Personal history of other malignant neoplasm of bronchus and lung: Secondary | ICD-10-CM | POA: Diagnosis not present

## 2023-08-14 DIAGNOSIS — E785 Hyperlipidemia, unspecified: Secondary | ICD-10-CM | POA: Diagnosis not present

## 2023-12-10 DIAGNOSIS — L0390 Cellulitis, unspecified: Secondary | ICD-10-CM | POA: Diagnosis not present

## 2024-04-03 ENCOUNTER — Ambulatory Visit (HOSPITAL_COMMUNITY)
Admission: RE | Admit: 2024-04-03 | Discharge: 2024-04-03 | Disposition: A | Discharge status: Home / Self Care | Payer: Medicare HMO | Source: Ambulatory Visit | Attending: Hematology | Admitting: Hematology

## 2024-04-03 ENCOUNTER — Inpatient Hospital Stay: Payer: Medicare HMO | Attending: Hematology

## 2024-04-03 DIAGNOSIS — D72820 Lymphocytosis (symptomatic): Secondary | ICD-10-CM | POA: Diagnosis not present

## 2024-04-03 DIAGNOSIS — C3491 Malignant neoplasm of unspecified part of right bronchus or lung: Secondary | ICD-10-CM | POA: Insufficient documentation

## 2024-04-03 DIAGNOSIS — Z9221 Personal history of antineoplastic chemotherapy: Secondary | ICD-10-CM | POA: Diagnosis not present

## 2024-04-03 DIAGNOSIS — Z85118 Personal history of other malignant neoplasm of bronchus and lung: Secondary | ICD-10-CM | POA: Insufficient documentation

## 2024-04-03 DIAGNOSIS — J439 Emphysema, unspecified: Secondary | ICD-10-CM | POA: Diagnosis not present

## 2024-04-03 DIAGNOSIS — Z801 Family history of malignant neoplasm of trachea, bronchus and lung: Secondary | ICD-10-CM | POA: Diagnosis not present

## 2024-04-03 DIAGNOSIS — I700 Atherosclerosis of aorta: Secondary | ICD-10-CM | POA: Diagnosis not present

## 2024-04-03 DIAGNOSIS — C3490 Malignant neoplasm of unspecified part of unspecified bronchus or lung: Secondary | ICD-10-CM | POA: Diagnosis not present

## 2024-04-03 DIAGNOSIS — Z87891 Personal history of nicotine dependence: Secondary | ICD-10-CM | POA: Diagnosis not present

## 2024-04-03 LAB — CBC WITH DIFFERENTIAL/PLATELET
Abs Immature Granulocytes: 0.03 10*3/uL (ref 0.00–0.07)
Basophils Absolute: 0.1 10*3/uL (ref 0.0–0.1)
Basophils Relative: 1 %
Eosinophils Absolute: 0.1 10*3/uL (ref 0.0–0.5)
Eosinophils Relative: 0 %
HCT: 42.3 % (ref 36.0–46.0)
Hemoglobin: 14.1 g/dL (ref 12.0–15.0)
Immature Granulocytes: 0 %
Lymphocytes Relative: 32 %
Lymphs Abs: 3.9 10*3/uL (ref 0.7–4.0)
MCH: 31.8 pg (ref 26.0–34.0)
MCHC: 33.3 g/dL (ref 30.0–36.0)
MCV: 95.3 fL (ref 80.0–100.0)
Monocytes Absolute: 0.9 10*3/uL (ref 0.1–1.0)
Monocytes Relative: 7 %
Neutro Abs: 7.3 10*3/uL (ref 1.7–7.7)
Neutrophils Relative %: 60 %
Platelets: 376 10*3/uL (ref 150–400)
RBC: 4.44 MIL/uL (ref 3.87–5.11)
RDW: 12.4 % (ref 11.5–15.5)
WBC: 12.3 10*3/uL — ABNORMAL HIGH (ref 4.0–10.5)
nRBC: 0 % (ref 0.0–0.2)

## 2024-04-03 LAB — COMPREHENSIVE METABOLIC PANEL WITH GFR
ALT: 13 U/L (ref 0–44)
AST: 20 U/L (ref 15–41)
Albumin: 4.5 g/dL (ref 3.5–5.0)
Alkaline Phosphatase: 72 U/L (ref 38–126)
Anion gap: 11 (ref 5–15)
BUN: 10 mg/dL (ref 8–23)
CO2: 27 mmol/L (ref 22–32)
Calcium: 10 mg/dL (ref 8.9–10.3)
Chloride: 101 mmol/L (ref 98–111)
Creatinine, Ser: 0.84 mg/dL (ref 0.44–1.00)
GFR, Estimated: >60 mL/min (ref >60)
Glucose, Bld: 99 mg/dL (ref 70–99)
Potassium: 3.3 mmol/L — ABNORMAL LOW (ref 3.5–5.1)
Sodium: 139 mmol/L (ref 135–145)
Total Bilirubin: 0.6 mg/dL (ref 0.0–1.2)
Total Protein: 7.7 g/dL (ref 6.5–8.1)

## 2024-04-05 ENCOUNTER — Other Ambulatory Visit: Payer: Self-pay | Admitting: Hematology

## 2024-04-05 DIAGNOSIS — F419 Anxiety disorder, unspecified: Secondary | ICD-10-CM

## 2024-04-10 ENCOUNTER — Inpatient Hospital Stay: Payer: Medicare HMO | Admitting: Hematology

## 2024-04-10 VITALS — BP 118/80 | HR 87 | Temp 98.0°F | Resp 18 | Wt 112.2 lb

## 2024-04-10 DIAGNOSIS — F419 Anxiety disorder, unspecified: Secondary | ICD-10-CM | POA: Diagnosis not present

## 2024-04-10 DIAGNOSIS — Z85118 Personal history of other malignant neoplasm of bronchus and lung: Secondary | ICD-10-CM | POA: Diagnosis not present

## 2024-04-10 DIAGNOSIS — C3491 Malignant neoplasm of unspecified part of right bronchus or lung: Secondary | ICD-10-CM | POA: Diagnosis not present

## 2024-04-10 DIAGNOSIS — Z9221 Personal history of antineoplastic chemotherapy: Secondary | ICD-10-CM | POA: Diagnosis not present

## 2024-04-10 DIAGNOSIS — Z87891 Personal history of nicotine dependence: Secondary | ICD-10-CM | POA: Diagnosis not present

## 2024-04-10 DIAGNOSIS — D72820 Lymphocytosis (symptomatic): Secondary | ICD-10-CM | POA: Diagnosis not present

## 2024-04-10 DIAGNOSIS — Z801 Family history of malignant neoplasm of trachea, bronchus and lung: Secondary | ICD-10-CM | POA: Diagnosis not present

## 2024-04-10 NOTE — Patient Instructions (Signed)
 [facility] Discharge Instructions   You were seen and examined today by Dr. Ellin Saba.  He reviewed the results of your lab work which are normal/stable.   We will see you back in 1 year.   Return as scheduled.    Thank you for choosing [facility] to provide your oncology and hematology care.  To afford each patient quality time with our provider, please arrive at least 15 minutes before your scheduled appointment time.   If you have a lab appointment with the Cancer Center please come in thru the Main Entrance and check in at the main information desk.  You need to re-schedule your appointment should you arrive 10 or more minutes late.  We strive to give you quality time with our providers, and arriving late affects you and other patients whose appointments are after yours.  Also, if you no show three or more times for appointments you may be dismissed from the clinic at the providers discretion.     Again, thank you for choosing [facility].  Our hope is that these requests will decrease the amount of time that you wait before being seen by our physicians.       _____________________________________________________________  Should you have questions after your visit to [facility], please contact our office at [phone] and follow the prompts.  Our office hours are 8:00 a.m. and 4:30 p.m. Monday - Friday.  Please note that voicemails left after 4:00 p.m. may not be returned until the following business day.  We are closed weekends and major holidays.  You do have access to a nurse 24-7, just call the main number to the clinic [phone] and do not press any options, hold on the line and a nurse will answer the phone.    For prescription refill requests, have your pharmacy contact our office and allow 72 hours.    Due to Covid, you will need to wear a mask upon entering the  hospital. If you do not have a mask, a mask will be given to you at the Main Entrance upon arrival. For doctor visits, patients may have 1 support person age 44 or older with them. For treatment visits, patients can not have anyone with them due to social distancing guidelines and our immunocompromised population.

## 2024-04-10 NOTE — Progress Notes (Signed)
 [facility] [address], [place] [address]    Clinic Day:  04/10/2024  Referring physician: [facility]  Patient Care Team: [facility] as PCP - General   ASSESSMENT & PLAN:   Assessment: 1.  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 - Right upper lobe wedge and right lower lobe segmentectomy and lymph node dissection on 02/27/2019. -Pathology shows 3.3 cm invasive adenocarcinoma of the right lower lobe, positive visceral pleural invasion.  2.7 cm adenocarcinoma of the right upper lobe with no perineural invasion.  11 mediastinal and hilar lymph nodes are negative.  Pathological staging is PT4PN0.  Lymphovascular invasion negative.  Margins negative. - 4 cycles of adjuvant chemotherapy with carboplatin  and pemetrexed  from 04/04/2019 through 06/10/2019. -CT chest on 08/27/2020 shows stable findings with no evidence of recurrence. - Quit smoking 07/2022.  Plan: 1.  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  She had occasional sinus infection. - Labs from 04/03/2024: Normal LFTs and creatinine.  CBC grossly normal. - CT chest without contrast on 04/03/2024 reviewed by me shows no significant changes.  Will await radiology reports. - She will come back in a year with repeat CT chest without contrast.   2.  Lymphocytic leukocytosis: - She has intermittent leukocytosis, predominantly elevated lymphocytes. - Previous flow cytometry in November 2022 was negative for B-cell lymphoproliferative process. - She does not have any B symptoms.  The latest white count is 12.3.  If there is any significant change from her baseline, we will consider further workup.  Orders Placed This Encounter  Procedures   CT CHEST WO CONTRAST    Standing Status:    Future    Expected Date:   03/30/2025    Expiration Date:   04/09/2025    Preferred imaging location?:   [facility]   CBC with Differential    Standing Status:   Future    Expected Date:   03/30/2025    Expiration Date:   04/06/2025   Comprehensive metabolic panel    Standing Status:   Future    Expected Date:   03/30/2025    Expiration Date:   04/06/2025      LILLETTE Hummingbird R Teague,acting as a scribe for Alean Stands, MD.,have documented all relevant documentation on the behalf of Alean Stands, MD,as directed by  Alean Stands, MD while in the presence of Alean Stands, MD.  I, Alean Stands MD, have reviewed the above documentation for accuracy and completeness, and I agree with the above.    Alean Stands, MD   6/26/20253:32 PM  CHIEF COMPLAINT:   Diagnosis: right lung cancer    Cancer Staging  Primary lung adenocarcinoma, right (HCC) Staging form: Lung, AJCC 8th Edition - Clinical: Occult carcinoma (cTX, cN0, cM0) - Signed by Stands Alean, MD on 03/24/2019 - Pathologic: Stage IIIA (pT4, pN0, cM0) - Signed by Stands Alean, MD on 03/24/2019    Prior Therapy: 1. Right upper lobe wedge and right lower lobe segmentectomy on 02/27/2019. 2. Carboplatin  and pemetrexed  x 4 cycles from 04/04/2019 to 06/10/2019  Current Therapy:  surveillance    HISTORY OF PRESENT ILLNESS:   Oncology History  Primary lung adenocarcinoma, right (HCC)  03/24/2019 Initial Diagnosis   Primary lung adenocarcinoma, right (HCC)   03/24/2019 Cancer Staging   Staging  form: Lung, AJCC 8th Edition - Clinical: Stage Occult (cTX, cN0, cM0) - Signed by Rogers Hai, MD on 03/24/2019   03/24/2019 Cancer Staging   Staging form: Lung, AJCC 8th Edition - Pathologic: Stage IIIA (pT4, pN0, cM0) - Signed by Rogers Hai, MD on 03/24/2019   04/04/2019 - 06/10/2019 Chemotherapy   Patient is on Treatment Plan : LUNG NSCLC Pemetrexed  (Alimta ) / Carboplatin   q21d x 4 cycles        INTERVAL HISTORY:   Khamani is a 68 y.o. female presenting to clinic today for follow up of right lung cancer. She was last seen by me on 04/11/23.  Since her last visit, she underwent chest CT on 04/03/24.  Today, she states that she is doing well overall. Her appetite level is at 75%. Her energy level is at 75%. Mckay denies any lung infection in the last year. She did have a sinus infection within the last year. She is remodeling parts of her house, causing her to breath in paints and primers.   She has continued to quit smoking, though she does have significant second hand smoke exposure from her son.   PAST MEDICAL HISTORY:   Past Medical History: Past Medical History:  Diagnosis Date   Anxiety    IBS (irritable bowel syndrome)    Multiple lung nodules     Surgical History: Past Surgical History:  Procedure Laterality Date   SEGMENTECOMY Right 02/27/2019   Procedure: RIGHT UPPER WEDGE AND RIGHT LOWER SEGMENTECTOMY LYMPH NODE DISSECTION;  Surgeon: Kerrin Elspeth BROCKS, MD;  Location: [facility] OR;  Service: Thoracic;  Laterality: Right;   TONSILLECTOMY     TUBAL LIGATION     VIDEO ASSISTED THORACOSCOPY Right 02/27/2019   Procedure: VIDEO ASSISTED THORACOSCOPY;  Surgeon: Kerrin Elspeth BROCKS, MD;  Location: [facility] OR;  Service: Thoracic;  Laterality: Right;    Social History: Social History   Socioeconomic History   Marital status: Married    Spouse name: Dallas   Number of children: 1   Years of education: Not on file   Highest education level: Not on file  Occupational History   Occupation: Teacher, early years/pre  Tobacco Use   Smoking status: Former    Current packs/day: 0.00    Types: Cigarettes    Quit date: 01/26/2019    Years since quitting: 5.2   Smokeless tobacco: Never  Vaping Use   Vaping status: Never Used  Substance and Sexual Activity   Alcohol use: Not Currently   Drug use: Not Currently   Sexual activity: Not on file  Other Topics  Concern   Not on file  Social History Narrative   Not on file   Social Drivers of Health   Financial Resource Strain: Low Risk  (08/14/2023)   Received from [facility]   Overall Financial Resource Strain (CARDIA)    Difficulty of Paying Living Expenses: Not hard at all  Food Insecurity: No Food Insecurity (08/14/2023)   [facility] from [facility]   Hunger Vital Sign    Within the past 12 months, you worried that your food would run out before you got the money to buy more.: Never true    Within the past 12 months, the food you bought just didn't last and you didn't have money to get more.: Never true  Transportation Needs: No Transportation Needs (08/14/2023)   Received from [facility] - Transportation    Lack of Transportation (Medical): No    Lack of Transportation (  Non-Medical): No  Physical Activity: Inactive (08/14/2023)   Received from [facility]   Exercise Vital Sign    On average, how many days per week do you engage in moderate to strenuous exercise (like a brisk walk)?: 0 days    On average, how many minutes do you engage in exercise at this level?: 0 min  Stress: No Stress Concern Present (08/14/2023)   Received from [facility] - Occupational Stress Questionnaire    Feeling of Stress : Only a little  Social Connections: Unknown (02/27/2022)   Received [facility]   Social Network    Social Network: Not on file  Intimate Partner Violence: Not At Risk (08/14/2023)   Received from [facility]   Humiliation, Afraid, Rape, and Kick questionnaire    Within the last year, have you been afraid of your partner or ex-partner?: No    Within the last year, have you been humiliated or emotionally abused in other ways by your partner or ex-partner?: No    Within the last year, have you been kicked, hit, slapped, or otherwise physically hurt by your partner or ex-partner?: No    Within the last  year, have you been raped or forced to have any kind of sexual activity by your partner or ex-partner?: No    Family History: Family History  Problem Relation Age of Onset   Myelodysplastic syndrome Mother    Clotting disorder Father    Lung cancer Paternal Aunt     Current Medications:  Current Outpatient Medications:    ALPRAZolam  (XANAX ) 0.25 MG tablet, TAKE 1 TABLET BY MOUTH 2 TIMES DAILY AS NEEDED FOR ANXIETY., Disp: 60 tablet, Rfl: 3   atorvastatin (LIPITOR) 20 MG tablet, Take 20 mg by mouth daily., Disp: , Rfl:    Calcium Carb-Cholecalciferol 600-800 MG-UNIT TABS, Take 1 tablet by mouth daily., Disp: , Rfl:    oxymetazoline  (AFRIN) 0.05 % nasal spray, Place 1 spray into both nostrils 2 (two) times daily as needed for congestion., Disp: , Rfl:    sertraline  (ZOLOFT ) 100 MG tablet, Take 1 tablet by mouth daily., Disp: , Rfl:    sodium chloride  (OCEAN) 0.65 % SOLN nasal spray, Place 1 spray into both nostrils as needed for congestion., Disp: , Rfl:    Allergies: Allergies  Allergen Reactions   Meperidine Hcl Swelling    Swelling around IV site Swelling around IV site   Aspirin Diarrhea   Ibuprofen Diarrhea and Nausea And Vomiting    Also give her heartburn     REVIEW OF SYSTEMS:   Review of Systems  Constitutional:  Negative for chills, fatigue and fever.  HENT:   Negative for lump/mass, mouth sores, nosebleeds, sore throat and trouble swallowing.   Eyes:  Negative for eye problems.  Respiratory:  Positive for cough. Negative for shortness of breath.   Cardiovascular:  Negative for chest pain, leg swelling and palpitations.  Gastrointestinal:  Negative for abdominal pain, constipation, diarrhea, nausea and vomiting.  Genitourinary:  Negative for bladder incontinence, difficulty urinating, dysuria, frequency, hematuria and nocturia.   Musculoskeletal:  Negative for arthralgias, back pain, flank pain, myalgias and neck pain.  Skin:  Negative for itching and rash.   Neurological:  Negative for dizziness, headaches and numbness.  Hematological:  Does not bruise/bleed easily.  Psychiatric/Behavioral:  Negative for depression, sleep disturbance and suicidal ideas. The patient is not nervous/anxious.   All other systems reviewed and are negative.  VITALS:   Blood pressure 118/80, pulse 87, temperature 98 F (36.7 C), temperature source Oral, resp. rate 18, weight 112 lb 3.4 oz (50.9 kg), SpO2 98%.  Wt Readings from Last 3 Encounters:  04/10/24 112 lb 3.4 oz (50.9 kg)  04/11/23 113 lb 12.8 oz (51.6 kg)  10/03/22 117 lb 8 oz (53.3 kg)    Body mass index is 18.67 kg/m.  Performance status (ECOG): 1 - Symptomatic but completely ambulatory  PHYSICAL EXAM:   Physical Exam Vitals and nursing note reviewed. Exam conducted with a chaperone present.  Constitutional:      Appearance: Normal appearance.   Cardiovascular:     Rate and Rhythm: Normal rate and regular rhythm.     Pulses: Normal pulses.     Heart sounds: Normal heart sounds.  Pulmonary:     Effort: Pulmonary effort is normal.     Breath sounds: Normal breath sounds.  Abdominal:     Palpations: Abdomen is soft. There is no hepatomegaly, splenomegaly or mass.     Tenderness: There is no abdominal tenderness.   Musculoskeletal:     Right lower leg: No edema.     Left lower leg: No edema.  Lymphadenopathy:     Cervical: No cervical adenopathy.     Right cervical: No superficial, deep or posterior cervical adenopathy.    Left cervical: No superficial, deep or posterior cervical adenopathy.     Upper Body:     Right upper body: No supraclavicular or axillary adenopathy.     Left upper body: No supraclavicular or axillary adenopathy.   Neurological:     General: No focal deficit present.     Mental Status: She is alert and oriented to person, place, and time.   Psychiatric:        Mood and Affect: Mood normal.        Behavior: Behavior normal.     LABS:      Latest Ref Rng  & Units 04/03/2024    1:43 PM 04/04/2023   12:53 PM 09/26/2022    1:58 PM  CBC  WBC 4.0 - 10.5 K/uL 12.3  10.7  12.0   Hemoglobin 12.0 - 15.0 g/dL 85.8  86.0  85.5   Hematocrit 36.0 - 46.0 % 42.3  41.8  42.8   Platelets 150 - 400 K/uL 376  390  347       Latest Ref Rng & Units 04/03/2024    1:43 PM 04/04/2023   12:53 PM 09/26/2022    1:58 PM  CMP  Glucose 70 - 99 mg/dL 99  898  99   BUN 8 - 23 mg/dL 10  11  16    Creatinine 0.44 - 1.00 mg/dL 9.15  9.18  9.14   Sodium 135 - 145 mmol/L 139  137  139   Potassium 3.5 - 5.1 mmol/L 3.3  4.0  4.0   Chloride 98 - 111 mmol/L 101  101  105   CO2 22 - 32 mmol/L 27  26  25    Calcium 8.9 - 10.3 mg/dL 89.9  9.4  9.9   Total Protein 6.5 - 8.1 g/dL 7.7  7.4  7.6   Total Bilirubin 0.0 - 1.2 mg/dL 0.6  0.7  0.5   Alkaline Phos 38 - 126 U/L 72  66  81   AST 15 - 41 U/L 20  17  20    ALT 0 - 44 U/L 13  12  15       No results  found for: CEA1, CEA / No results found for: CEA1, CEA No results found for: PSA1 No results found for: CAN199 No results found for: CAN125  No results found for: TOTALPROTELP, ALBUMINELP, A1GS, A2GS, BETS, BETA2SER, GAMS, MSPIKE, SPEI No results found for: TIBC, FERRITIN, IRONPCTSAT No results found for: LDH   STUDIES:   No results found.

## 2024-05-29 NOTE — Telephone Encounter (Signed)
 Patient is requesting the following refill Requested Prescriptions   Pending Prescriptions Disp Refills  . sertraline  (ZOLOFT ) 100 MG tablet 90 tablet 0    Sig: Take 1 tablet (100 mg total) by mouth daily.    Recent Visits Date Type Provider Dept  08/14/23 Office Visit Emmitt Geofm Reusing, MD [facility]  Showing recent visits within past 365 days and meeting all other requirements Future Appointments No visits were found meeting these conditions. Showing future appointments within next 365 days and meeting all other requirements    Labs: Pregnancy: No results found for: PREGPOC, PREGSERUM OBG: Menstrual History: OB History   No obstetric history on file.      No LMP recorded. Patient is postmenopausal.

## 2024-07-15 ENCOUNTER — Other Ambulatory Visit: Payer: Self-pay | Admitting: *Deleted

## 2024-07-17 ENCOUNTER — Other Ambulatory Visit: Payer: Self-pay | Admitting: *Deleted

## 2024-07-17 DIAGNOSIS — F419 Anxiety disorder, unspecified: Secondary | ICD-10-CM

## 2024-07-17 MED ORDER — ALPRAZOLAM 0.25 MG PO TABS: 0.2500 mg | ORAL_TABLET | Freq: Two times a day (BID) | ORAL | 3 refills | Status: DC | PRN

## 2024-11-18 ENCOUNTER — Ambulatory Visit: Admitting: Family Medicine

## 2024-11-18 ENCOUNTER — Encounter: Payer: Self-pay | Admitting: Family Medicine

## 2024-11-18 VITALS — BP 121/73 | HR 70 | Temp 97.4°F | Ht 65.0 in | Wt 111.0 lb

## 2024-11-18 DIAGNOSIS — H259 Unspecified age-related cataract: Secondary | ICD-10-CM | POA: Diagnosis not present

## 2024-11-18 DIAGNOSIS — C3491 Malignant neoplasm of unspecified part of right bronchus or lung: Secondary | ICD-10-CM

## 2024-11-18 DIAGNOSIS — F411 Generalized anxiety disorder: Secondary | ICD-10-CM | POA: Diagnosis not present

## 2024-11-18 DIAGNOSIS — E785 Hyperlipidemia, unspecified: Secondary | ICD-10-CM | POA: Diagnosis not present

## 2024-11-18 DIAGNOSIS — Z136 Encounter for screening for cardiovascular disorders: Secondary | ICD-10-CM

## 2024-11-18 DIAGNOSIS — Z130 Encounter for screening for diseases of the blood and blood-forming organs and certain disorders involving the immune mechanism: Secondary | ICD-10-CM

## 2024-11-18 DIAGNOSIS — Z85118 Personal history of other malignant neoplasm of bronchus and lung: Secondary | ICD-10-CM

## 2024-11-18 LAB — BAYER DCA HB A1C WAIVED: HB A1C (BAYER DCA - WAIVED): 5.7 % — ABNORMAL HIGH (ref 4.8–5.6)

## 2024-11-18 LAB — LIPID PANEL

## 2024-11-18 MED ORDER — SERTRALINE HCL 100 MG PO TABS: 100.0000 mg | ORAL_TABLET | Freq: Every day | ORAL | 0 refills | Status: AC

## 2024-11-18 MED ORDER — ATORVASTATIN CALCIUM 20 MG PO TABS: 20.0000 mg | ORAL_TABLET | Freq: Every day | ORAL | 0 refills | Status: AC

## 2024-11-18 NOTE — Progress Notes (Signed)
 Madeline Gates

## 2024-11-18 NOTE — Progress Notes (Signed)
 "  New Patient Office Visit  Patient ID: Madeline Gates, Female   DOB: 11-26-1955 69 y.o. MRN: [id number]  Chief Complaint  Patient presents with   Establish Care   Medication Refill   Subjective:     Madeline Gates presents to establish care  Medication Refill    Discussed the use of AI scribe software for clinical note transcription with the patient, who gave verbal consent to proceed.  History of Present Illness   Madeline Gates is a 69 year old female who presents for medication refills and to establish care.   Lung cancer - Diagnosed with primary lung adenocarcinoma, right in 2020.  - Patient underwent wedge resection and chemotherapy. - Last oncology appointment in June 2025. - Underwent chest CT on 04/03/24 that showed  Stable postsurgical changes in the right lung with no evidence of local recurrence or metastatic disease. - Currently on surveillance - No longer smoking  - Oncology follow-up scheduled for 04/08/2025  Hyperlipidemia - Taking Lipitor 20 mg daily  GAD - Taking Zoloft  100 mg daily for anxiety  Visual impairment due to cataracts - Cataracts in right eyes per patient - Is waiting to have surgery until she can afford the co-pay  Constitutional symptoms and preventive screening - No recent unexpected weight loss - No history of colonoscopy and not interested in pursuing one  - Is not interested in mammograms.        11/18/2024    1:55 PM  GAD 7 : Generalized Anxiety Score  Nervous, Anxious, on Edge 0  Control/stop worrying 0  Worry too much - different things 0  Trouble relaxing 0  Restless 0  Easily annoyed or irritable 0  Afraid - awful might happen 0  Total GAD 7 Score 0  Anxiety Difficulty Not difficult at all       11/18/2024    1:55 PM 04/10/2024    2:56 PM  Depression screen PHQ 2/9  Decreased Interest 0 0  Down, Depressed, Hopeless 0 0  PHQ - 2 Score 0 0  Altered sleeping 0   Tired, decreased energy 0   Change in appetite 0    Feeling bad or failure about yourself  0   Trouble concentrating 0   Moving slowly or fidgety/restless 0   Suicidal thoughts 0   PHQ-9 Score 0   Difficult doing work/chores Not difficult at all       Outpatient Encounter Medications as of 11/18/2024  Medication Sig   [DISCONTINUED] atorvastatin  (LIPITOR) 20 MG tablet Take 20 mg by mouth daily.   [DISCONTINUED] sertraline  (ZOLOFT ) 100 MG tablet Take 1 tablet by mouth daily.   atorvastatin  (LIPITOR) 20 MG tablet Take 1 tablet (20 mg total) by mouth daily.   sertraline  (ZOLOFT ) 100 MG tablet Take 1 tablet (100 mg total) by mouth daily.   [DISCONTINUED] ALPRAZolam  (XANAX ) 0.25 MG tablet Take 1 tablet (0.25 mg total) by mouth 2 (two) times daily as needed for anxiety. (Patient not taking: Reported on 11/18/2024)   [DISCONTINUED] Calcium  Carb-Cholecalciferol 600-800 MG-UNIT TABS Take 1 tablet by mouth daily.   [DISCONTINUED] oxymetazoline  (AFRIN) 0.05 % nasal spray Place 1 spray into both nostrils 2 (two) times daily as needed for congestion.   [DISCONTINUED] sodium chloride  (OCEAN) 0.65 % SOLN nasal spray Place 1 spray into both nostrils as needed for congestion.   No facility-administered encounter medications on file as of 11/18/2024.    Past Medical History:  Diagnosis Date   Anxiety  IBS (irritable bowel syndrome)    Multiple lung nodules     Past Surgical History:  Procedure Laterality Date   SEGMENTECOMY Right 02/27/2019   Procedure: RIGHT UPPER WEDGE AND RIGHT LOWER SEGMENTECTOMY LYMPH NODE DISSECTION;  Surgeon: Kerrin Elspeth BROCKS, MD;  Location: [facility] OR;  Service: Thoracic;  Laterality: Right;   TONSILLECTOMY     TUBAL LIGATION     VIDEO ASSISTED THORACOSCOPY Right 02/27/2019   Procedure: VIDEO ASSISTED THORACOSCOPY;  Surgeon: Kerrin Elspeth BROCKS, MD;  Location: [facility] OR;  Service: Thoracic;  Laterality: Right;    Family History  Problem Relation Age of Onset   Myelodysplastic syndrome Mother    Clotting disorder Father     Lung cancer Paternal Aunt     Social History   Socioeconomic History   Marital status: Married    Spouse name: [place]   Number of children: 1   Years of education: Not on file   Highest education level: 12th grade  Occupational History   Occupation: Teacher, Early Years/pre  Tobacco Use   Smoking status: Former    Current packs/day: 0.00    Average packs/day: 1.0 packs/day    Types: Cigarettes    Quit date: 01/26/2019    Years since quitting: 5.8   Smokeless tobacco: Never  Vaping Use   Vaping status: Never Used  Substance and Sexual Activity   Alcohol use: Not Currently   Drug use: Not Currently   Sexual activity: Not on file  Other Topics Concern   Not on file  Social History Narrative   Not on file   Social Drivers of Health   Tobacco Use: Medium Risk (11/18/2024)   Patient History    Smoking Tobacco Use: Former    Smokeless Tobacco Use: Never    Passive Exposure: Not on file  Financial Resource Strain: Medium Risk (11/14/2024)   Overall Financial Resource Strain (CARDIA)    Difficulty of Paying Living Expenses: Somewhat hard  Food Insecurity: No Food Insecurity (11/14/2024)   Epic    Worried About Programme Researcher, Broadcasting/film/video in the Last Year: Never true    Ran Out of Food in the Last Year: Never true  Transportation Needs: No Transportation Needs (11/14/2024)   Epic    Lack of Transportation (Medical): No    Lack of Transportation (Non-Medical): No  Physical Activity: Unknown (11/14/2024)   Exercise Vital Sign    Days of Exercise per Week: 7 days    Minutes of Exercise per Session: Patient declined  Stress: Stress Concern Present (11/14/2024)   [employer] [facility] - Occupational Stress Questionnaire    Feeling of Stress: To some extent  Social Connections: Unknown (11/14/2024)   Social Connection and Isolation Panel    Frequency of Communication with Friends and Family: Once a week    Frequency of Social Gatherings with Friends and Family: Patient declined     Attends Religious Services: Patient declined    Database Administrator or Organizations: No    Attends Engineer, Structural: Not on file    Marital Status: Married  Catering Manager Violence: Not At Risk (08/14/2023)   Received from [facility]   Epic    Within the last year, have you been afraid of your partner or ex-partner?: No    Within the last year, have you been humiliated or emotionally abused in other ways by your partner or ex-partner?: No    Within the last year, have you been kicked, hit, slapped, or otherwise  physically hurt by your partner or ex-partner?: No    Within the last year, have you been raped or forced to have any kind of sexual activity by your partner or ex-partner?: No  Depression (PHQ2-9): Low Risk (11/18/2024)   Depression (PHQ2-9)    PHQ-2 Score: 0  Alcohol Screen: Not on file  Housing: Unknown (11/14/2024)   Epic    Unable to Pay for Housing in the Last Year: No    Number of Times Moved in the Last Year: Not on file    Homeless in the Last Year: No  Utilities: Low Risk (08/14/2023)   Received from [facility]   Utilities    Within the past 12 months, have you been unable to get utilities(heat, electricity) when it was really needed?: No  Health Literacy: Low Risk (08/14/2023)   Received from [facility] Literacy    How often do you need to have someone help you when you read instructions, pamphlets, or other written material from your doctor or pharmacy?: Never    ROS    Objective:    BP 121/73 (Cuff Size: Normal)   Pulse 70   Temp (!) 97.4 F (36.3 C)   Ht 5' 5 (1.651 m)   Wt 111 lb (50.3 kg)   SpO2 97%   BMI 18.47 kg/m   Physical Exam Vitals reviewed.  Constitutional:      Appearance: Normal appearance.  HENT:     Head: Normocephalic and atraumatic.  Eyes:     Extraocular Movements: Extraocular movements intact.     Conjunctiva/sclera: Conjunctivae normal.     Pupils: Pupils are equal, round, and  reactive to light.  Cardiovascular:     Rate and Rhythm: Normal rate and regular rhythm.     Pulses: Normal pulses.     Heart sounds: Normal heart sounds. No murmur heard. Pulmonary:     Effort: Pulmonary effort is normal. No respiratory distress.     Breath sounds: Normal breath sounds.  Musculoskeletal:        General: No swelling or deformity. Normal range of motion.     Cervical back: Normal range of motion and neck supple.  Skin:    General: Skin is warm and dry.  Neurological:     General: No focal deficit present.     Mental Status: She is alert and oriented to person, place, and time.  Psychiatric:        Mood and Affect: Mood normal.        Behavior: Behavior normal.          Assessment & Plan:   Problem List Items Addressed This Visit       Respiratory   Primary lung adenocarcinoma, right (HCC)     Other   Anxiety, generalized   Relevant Medications   sertraline  (ZOLOFT ) 100 MG tablet   Other Visit Diagnoses       Hyperlipidemia, unspecified hyperlipidemia type    -  Primary   Relevant Medications   atorvastatin  (LIPITOR) 20 MG tablet   Other Relevant Orders   Lipid Panel     Screening for endocrine, nutritional, metabolic and immunity disorder       Relevant Orders   Bayer DCA Hb A1c Waived   Comprehensive metabolic panel with GFR   CBC with Differential     Encounter for screening for cardiovascular disorders         Age-related cataract of both eyes, unspecified age-related cataract type  Assessment and Plan    History of lung cancer - Last chest CT on 04/03/24 showed stable postsurgical changes in the right lung with no evidence of local recurrence or metastatic disease. - Continue surveillance with oncology.   - Next oncology appointment 04/08/2025  Hyperlipidemia - Refilled Lipitor 20 mg/day.  - Ordered lipid panel to assess current cholesterol levels.  Generalized anxiety disorder - Well-controlled - Refilled Zoloft  100  mg/day  Cataracts - Follow-up with ophthalmology       Return in about 3 months (around 02/15/2025).   Oneil LELON Severin, FNP [facility]     "

## 2024-11-19 ENCOUNTER — Ambulatory Visit: Payer: Self-pay | Admitting: Family Medicine

## 2024-11-19 ENCOUNTER — Telehealth: Payer: Self-pay | Admitting: Family Medicine

## 2024-11-19 LAB — CBC WITH DIFFERENTIAL/PLATELET
Basophils Absolute: 0.1 10*3/uL (ref 0.0–0.2)
Basos: 1 %
EOS (ABSOLUTE): 0.1 10*3/uL (ref 0.0–0.4)
Eos: 1 %
Hematocrit: 44.2 % (ref 34.0–46.6)
Hemoglobin: 14.6 g/dL (ref 11.1–15.9)
Immature Grans (Abs): 0 10*3/uL (ref 0.0–0.1)
Immature Granulocytes: 0 %
Lymphocytes Absolute: 4.4 10*3/uL — ABNORMAL HIGH (ref 0.7–3.1)
Lymphs: 36 %
MCH: 31.3 pg (ref 26.6–33.0)
MCHC: 33 g/dL (ref 31.5–35.7)
MCV: 95 fL (ref 79–97)
Monocytes Absolute: 0.7 10*3/uL (ref 0.1–0.9)
Monocytes: 6 %
Neutrophils Absolute: 6.8 10*3/uL (ref 1.4–7.0)
Neutrophils: 56 %
Platelets: 384 10*3/uL (ref 150–450)
RBC: 4.66 x10E6/uL (ref 3.77–5.28)
RDW: 11.6 % — ABNORMAL LOW (ref 11.7–15.4)
WBC: 12.1 10*3/uL — ABNORMAL HIGH (ref 3.4–10.8)

## 2024-11-19 LAB — LIPID PANEL
Cholesterol, Total: 168 mg/dL (ref 100–199)
HDL: 70 mg/dL
LDL CALC COMMENT:: 2.4 ratio (ref 0.0–4.4)
LDL Chol Calc (NIH): 80 mg/dL (ref 0–99)
Triglycerides: 103 mg/dL (ref 0–149)
VLDL Cholesterol Cal: 18 mg/dL (ref 5–40)

## 2024-11-19 LAB — COMPREHENSIVE METABOLIC PANEL WITH GFR
ALT: 12 [IU]/L (ref 0–32)
AST: 21 [IU]/L (ref 0–40)
Albumin: 4.9 g/dL (ref 3.9–4.9)
Alkaline Phosphatase: 91 [IU]/L (ref 49–135)
BUN/Creatinine Ratio: 13 (ref 12–28)
BUN: 13 mg/dL (ref 8–27)
Bilirubin Total: 0.2 mg/dL (ref 0.0–1.2)
CO2: 24 mmol/L (ref 20–29)
Calcium: 10.2 mg/dL (ref 8.7–10.3)
Chloride: 103 mmol/L (ref 96–106)
Creatinine, Ser: 0.99 mg/dL (ref 0.57–1.00)
Globulin, Total: 2.4 g/dL (ref 1.5–4.5)
Glucose: 94 mg/dL (ref 70–99)
Potassium: 5 mmol/L (ref 3.5–5.2)
Sodium: 144 mmol/L (ref 134–144)
Total Protein: 7.3 g/dL (ref 6.0–8.5)
eGFR: 62 mL/min/{1.73_m2}

## 2024-11-19 NOTE — Telephone Encounter (Signed)
 Patient signed record release form and it was faxed to [facility] at fax# [phone].

## 2025-04-01 ENCOUNTER — Other Ambulatory Visit (HOSPITAL_COMMUNITY)

## 2025-04-01 ENCOUNTER — Other Ambulatory Visit

## 2025-04-08 ENCOUNTER — Ambulatory Visit: Admitting: Physician Assistant
